# Patient Record
Sex: Female | Born: 1970 | Hispanic: Yes | Marital: Married | Smoking: Never smoker
Health system: Southern US, Community
[De-identification: ages and names within clinical notes are randomized; demographics above are authoritative.]

## PROBLEM LIST (undated history)

## (undated) DIAGNOSIS — R200 Anesthesia of skin: Secondary | ICD-10-CM

## (undated) DIAGNOSIS — R202 Paresthesia of skin: Secondary | ICD-10-CM

## (undated) DIAGNOSIS — R51 Headache: Secondary | ICD-10-CM

## (undated) DIAGNOSIS — K219 Gastro-esophageal reflux disease without esophagitis: Secondary | ICD-10-CM

## (undated) DIAGNOSIS — R198 Other specified symptoms and signs involving the digestive system and abdomen: Secondary | ICD-10-CM

## (undated) DIAGNOSIS — E669 Obesity, unspecified: Secondary | ICD-10-CM

## (undated) DIAGNOSIS — E119 Type 2 diabetes mellitus without complications: Secondary | ICD-10-CM

## (undated) DIAGNOSIS — Z8042 Family history of malignant neoplasm of prostate: Secondary | ICD-10-CM

## (undated) DIAGNOSIS — M542 Cervicalgia: Secondary | ICD-10-CM

## (undated) DIAGNOSIS — E8941 Symptomatic postprocedural ovarian failure: Secondary | ICD-10-CM

## (undated) DIAGNOSIS — R7611 Nonspecific reaction to tuberculin skin test without active tuberculosis: Secondary | ICD-10-CM

## (undated) DIAGNOSIS — R519 Headache, unspecified: Secondary | ICD-10-CM

## (undated) DIAGNOSIS — R319 Hematuria, unspecified: Secondary | ICD-10-CM

## (undated) DIAGNOSIS — R21 Rash and other nonspecific skin eruption: Secondary | ICD-10-CM

## (undated) DIAGNOSIS — I10 Essential (primary) hypertension: Secondary | ICD-10-CM

## (undated) DIAGNOSIS — C562 Malignant neoplasm of left ovary: Secondary | ICD-10-CM

## (undated) DIAGNOSIS — R109 Unspecified abdominal pain: Secondary | ICD-10-CM

## (undated) DIAGNOSIS — Z8719 Personal history of other diseases of the digestive system: Secondary | ICD-10-CM

## (undated) HISTORY — DX: Essential (primary) hypertension: I10

## (undated) HISTORY — PX: CHOLECYSTECTOMY: SHX55

## (undated) HISTORY — DX: Family history of malignant neoplasm of prostate: Z80.42

---
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
## 2012-06-14 ENCOUNTER — Other Ambulatory Visit (HOSPITAL_COMMUNITY): Payer: Self-pay | Admitting: *Deleted

## 2012-06-14 DIAGNOSIS — Z1231 Encounter for screening mammogram for malignant neoplasm of breast: Secondary | ICD-10-CM

## 2012-06-24 ENCOUNTER — Ambulatory Visit (HOSPITAL_COMMUNITY)
Admission: RE | Admit: 2012-06-24 | Discharge: 2012-06-24 | Disposition: A | Discharge status: Home / Self Care | Payer: Self-pay | Source: Ambulatory Visit | Attending: *Deleted | Admitting: *Deleted

## 2012-06-24 DIAGNOSIS — Z1231 Encounter for screening mammogram for malignant neoplasm of breast: Secondary | ICD-10-CM

## 2013-10-17 ENCOUNTER — Other Ambulatory Visit (HOSPITAL_COMMUNITY): Payer: Self-pay | Admitting: *Deleted

## 2013-10-17 DIAGNOSIS — Z1231 Encounter for screening mammogram for malignant neoplasm of breast: Secondary | ICD-10-CM

## 2013-10-29 ENCOUNTER — Ambulatory Visit (HOSPITAL_COMMUNITY)
Admission: RE | Admit: 2013-10-29 | Discharge: 2013-10-29 | Disposition: A | Discharge status: Home / Self Care | Payer: Self-pay | Source: Ambulatory Visit | Attending: *Deleted | Admitting: *Deleted

## 2013-10-29 DIAGNOSIS — Z1231 Encounter for screening mammogram for malignant neoplasm of breast: Secondary | ICD-10-CM

## 2017-03-22 ENCOUNTER — Other Ambulatory Visit (HOSPITAL_COMMUNITY): Payer: Self-pay | Admitting: *Deleted

## 2017-03-22 DIAGNOSIS — Z1231 Encounter for screening mammogram for malignant neoplasm of breast: Secondary | ICD-10-CM

## 2017-03-26 ENCOUNTER — Ambulatory Visit (HOSPITAL_COMMUNITY)
Admission: RE | Admit: 2017-03-26 | Discharge: 2017-03-26 | Disposition: A | Discharge status: Home / Self Care | Payer: PRIVATE HEALTH INSURANCE | Source: Ambulatory Visit | Attending: *Deleted | Admitting: *Deleted

## 2017-03-26 DIAGNOSIS — Z1231 Encounter for screening mammogram for malignant neoplasm of breast: Secondary | ICD-10-CM | POA: Diagnosis not present

## 2017-04-02 ENCOUNTER — Other Ambulatory Visit (HOSPITAL_COMMUNITY): Payer: Self-pay | Admitting: Nurse Practitioner

## 2017-04-02 DIAGNOSIS — R102 Pelvic and perineal pain: Secondary | ICD-10-CM

## 2017-04-05 ENCOUNTER — Ambulatory Visit (HOSPITAL_COMMUNITY): Payer: Self-pay

## 2017-07-28 HISTORY — PX: OTHER SURGICAL HISTORY: SHX169

## 2017-09-07 ENCOUNTER — Telehealth: Payer: Self-pay | Admitting: *Deleted

## 2017-09-07 NOTE — Telephone Encounter (Signed)
Called and spoke to the [facility] office, gave appt for 7/23 at 10:45am. Ask them to fax over records

## 2017-09-14 ENCOUNTER — Encounter: Payer: Self-pay | Admitting: *Deleted

## 2017-09-18 ENCOUNTER — Inpatient Hospital Stay: Payer: Self-pay | Attending: Gynecologic Oncology | Admitting: Gynecologic Oncology

## 2017-09-18 ENCOUNTER — Telehealth: Payer: Self-pay | Admitting: Gynecologic Oncology

## 2017-09-18 ENCOUNTER — Encounter: Payer: Self-pay | Admitting: Gynecologic Oncology

## 2017-09-18 ENCOUNTER — Telehealth: Payer: Self-pay | Admitting: Oncology

## 2017-09-18 ENCOUNTER — Inpatient Hospital Stay: Payer: Self-pay

## 2017-09-18 VITALS — BP 125/56 | HR 61 | Temp 98.5°F | Resp 20 | Ht 63.0 in | Wt 194.3 lb

## 2017-09-18 DIAGNOSIS — C562 Malignant neoplasm of left ovary: Secondary | ICD-10-CM | POA: Insufficient documentation

## 2017-09-18 DIAGNOSIS — Z90722 Acquired absence of ovaries, bilateral: Secondary | ICD-10-CM | POA: Insufficient documentation

## 2017-09-18 DIAGNOSIS — Z6841 Body Mass Index (BMI) 40.0 and over, adult: Secondary | ICD-10-CM

## 2017-09-18 LAB — BASIC METABOLIC PANEL - CANCER CENTER ONLY
ANION GAP: 9 (ref 5–15)
BUN: 10 mg/dL (ref 6–20)
CHLORIDE: 107 mmol/L (ref 98–111)
CO2: 23 mmol/L (ref 22–32)
Calcium: 9.7 mg/dL (ref 8.9–10.3)
Creatinine: 0.67 mg/dL (ref 0.44–1.00)
GFR, Estimated: >60 mL/min (ref >60)
Glucose, Bld: 81 mg/dL (ref 70–99)
POTASSIUM: 4 mmol/L (ref 3.5–5.1)
SODIUM: 139 mmol/L (ref 135–145)

## 2017-09-18 NOTE — Progress Notes (Signed)
Consult Note: Gyn-Onc  Consult was requested by Dr. Barrie Dunker for the evaluation of Kristy Franklin 47 y.o. female  CC:  Chief Complaint  Patient presents with  . Serous adenocarcinoma [facility])    Assessment/Plan:  Kristy Franklin  is a 47 y.o.  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  I discussed the nature of ovarian cancer with the patient.  I discussed that due to the high-grade nature of her lesion and the rupture of the capsule intraoperatively and its adherence to surrounding structures we are recommending adjuvant chemotherapy with 6 cycles of carboplatin and paclitaxel.  I believe it is beneficial to perform surgical staging and completion surgery prior to commencing chemotherapy.  This will involve a robotically assisted total hysterectomy right salpingo-oophorectomy, omental biopsy, and lymphadenectomy.  Due to her morbid obesity which is predominantly central and abdominal and extensive lymphadenectomy may be limited.  Given that the results of staging will not change postoperative treatment, we will perform what is surgically feasible and safe minimizing the likelihood of morbidity so that she can move on to chemotherapy postoperatively.  Prior to performing surgery will obtain a CT scan of the chest abdomen and pelvis to look for gross residual disease after primary surgery that will need to be addressed at the time of the surgery, and to rule out the necessity of a debulking procedure.  I discussed surgical risks with the patient including  bleeding, infection, damage to internal organs (such as bladder,ureters, bowels), blood clot, reoperation and rehospitalization.  I explained that these risks are higher for obese patients.  She will require prolonged postoperative anticoagulation due to her cancer diagnosis.  Surgery is scheduled for the first week of August 2019.  She will require referral to genetics given her epithelial ovarian cancer and  a referral to Dr Alvy Bimler for chemotherapy.    HPI: Kristy Franklin is a 47 year old P2 who is seen in consultation at the request of Dr Barrie Dunker for high grade serous ovarian cancer, clinical stage IC.  The patient reports a history of having seen the emergency room in [place] in May or June 2019 when she developed left lower quadrant pain.  During the ER visit an ultrasound performed which identified an adnexal mass on the left, with no signs of torsion.  She was then seen in the offices of Dr. Barrie Dunker in June 2019 and a plan was made to proceed with surgical removal of the adnexa.  On August 23, 2017 she was taken to the operating room for a laparoscopic left salpingo-oophorectomy.  Review of the operative note suggest that intraoperative findings were significant for a 6 cm left adnexal mass that was stuck in the cul-de-sac with some paraovarian adhesions noted.  It did not appear to be malignant in appearance to the surgeons at the time of surgery.  The upper abdomen, diaphragms, and omentum were commented on as looking normal.  There was no ascites.  The left tube and ovary was removed, however there was some fragmentation of the specimen during removal, and therefore the left tube and ovary were sent as 2 specimens.  The first specimen is labeled ovary biopsy for frozen section which revealed serous carcinoma and the second specimen is labeled left ovary on permanent this revealed serous carcinoma.  The operative note reports that the intraoperative diagnosis was borderline tumor, or reviewing the pathology report it states that the intraoperative diagnosis was left ovarian biopsy epithelial neoplasm at least borderline,  cannot exclude carcinoma.  The frozen section of the second specimen was the same (epithelial neoplasm at least borderline cannot exclude carcinoma.).  The patient was seen back in the office by Dr. Barrie Dunker on September 06, 2017 and delivered her diagnosis of carcinoma.  At that time she  was referred for consultation with gynecologic oncology.  Her past medical history 6 significant for obesity and hypertension.  She has had 2 prior cesarean deliveries.  She does not desire future fertility as she is completed childbearing.  She has no history of abnormal Pap smears and her last Pap was 7 to 8 months ago was normal.  Her past surgical history is significant for 2 cesarean sections and a laparoscopic cholecystectomy.  Her only family history is significant for father with prostate cancer at age 2.  Current Meds:  Outpatient Encounter Medications as of 09/18/2017  Medication Sig  . HYDROcodone-acetaminophen (NORCO/VICODIN) 5-325 MG tablet Take 1 tablet by mouth every 6 (six) hours as needed. for pain  . lisinopril (PRINIVIL,ZESTRIL) 10 MG tablet Take 1 tablet by mouth daily.   No facility-administered encounter medications on file as of 09/18/2017.     Allergy:  Allergies  Allergen Reactions  . Other Rash    Pork    Social Hx:   Social History   Socioeconomic History  . Marital status: Married    Spouse name: Not on file  . Number of children: Not on file  . Years of education: Not on file  . Highest education level: Not on file  Occupational History  . Not on file  Social Needs  . Financial resource strain: Not on file  . Food insecurity:    Worry: Not on file    Inability: Not on file  . Transportation needs:    Medical: Not on file    Non-medical: Not on file  Tobacco Use  . Smoking status: Never Smoker  . Smokeless tobacco: Never Used  Substance and Sexual Activity  . Alcohol use: Never    Frequency: Never  . Drug use: Never  . Sexual activity: Not on file  Lifestyle  . Physical activity:    Days per week: Not on file    Minutes per session: Not on file  . Stress: Not on file  Relationships  . Social connections:    Talks on phone: Not on file    Gets together: Not on file    Attends religious service: Not on file    Active member of club or  organization: Not on file    Attends meetings of clubs or organizations: Not on file    Relationship status: Not on file  . Intimate partner violence:    Fear of current or ex partner: Not on file    Emotionally abused: Not on file    Physically abused: Not on file    Forced sexual activity: Not on file  Other Topics Concern  . Not on file  Social History Narrative  . Not on file    Past Surgical Hx:  Past Surgical History:  Procedure Laterality Date  . CESAREAN SECTION    . CHOLECYSTECTOMY     20 years ago  . OTHER SURGICAL HISTORY  07/2017   Left ovary removed    Past Medical Hx:  Past Medical History:  Diagnosis Date  . Hypertension     Past Gynecological History:  C/s x 2. No history of abnormal paps.  No LMP recorded.  Family Hx:  Family History  Problem Relation Age of Onset  . Hypertension Mother   . Prostate cancer Father     Review of Systems:  Constitutional  Feels well,    ENT Normal appearing ears and nares bilaterally Skin/Breast  No rash, sores, jaundice, itching, dryness Cardiovascular  No chest pain, shortness of breath, or edema  Pulmonary  No cough or wheeze.  Gastro Intestinal  No nausea, vomitting, or diarrhoea. No bright red blood per rectum, no abdominal pain, change in bowel movement, or constipation.  Genito Urinary  No frequency, urgency, dysuria, no pain, no bleeding. Musculo Skeletal  No myalgia, arthralgia, joint swelling or pain  Neurologic  No weakness, numbness, change in gait,  Psychology  No depression, anxiety, insomnia.   Vitals:  Blood pressure (!) 125/56, pulse 61, temperature 98.5 F (36.9 C), temperature source Oral, resp. rate 20, height 5\' 3"  (1.6 m), weight 194 lb 4.8 oz (88.1 kg), SpO2 100 %.  Physical Exam: WD in NAD Neck  Supple NROM, without any enlargements.  Lymph Node Survey No cervical supraclavicular or inguinal adenopathy Cardiovascular  Pulse normal rate, regularity and rhythm. S1 and S2  normal.  Lungs  Clear to auscultation bilateraly, without wheezes/crackles/rhonchi. Good air movement.  Skin  No rash/lesions/breakdown  Psychiatry  Alert and oriented to person, place, and time  Abdomen  Normoactive bowel sounds, abdomen soft, non-tender and obese without evidence of hernia.  Back No CVA tenderness Genito Urinary  Vulva/vagina: Normal external female genitalia.   No lesions. No discharge or bleeding.  Bladder/urethra:  No lesions or masses, well supported bladder  Vagina: normal  Cervix: Normal appearing, no lesions.  Uterus:  Slightly bulky, mobile, no parametrial involvement or nodularity.  Adnexa: no palpable masses. Rectal  deferred Extremities  No bilateral cyanosis, clubbing or edema.   Thereasa Solo, MD  09/18/2017, 5:02 PM

## 2017-09-18 NOTE — Telephone Encounter (Signed)
Called patient regarding 8/14

## 2017-09-18 NOTE — H&P (View-Only) (Signed)
Consult Note: Gyn-Onc  Consult was requested by Dr. Barrie Dunker for the evaluation of Kristy Franklin 47 y.o. female  CC:  Chief Complaint  Patient presents with  . Serous adenocarcinoma [facility])    Assessment/Plan:  Kristy Franklin  is a 47 y.o.  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  I discussed the nature of ovarian cancer with the patient.  I discussed that due to the high-grade nature of her lesion and the rupture of the capsule intraoperatively and its adherence to surrounding structures we are recommending adjuvant chemotherapy with 6 cycles of carboplatin and paclitaxel.  I believe it is beneficial to perform surgical staging and completion surgery prior to commencing chemotherapy.  This will involve a robotically assisted total hysterectomy right salpingo-oophorectomy, omental biopsy, and lymphadenectomy.  Due to her morbid obesity which is predominantly central and abdominal and extensive lymphadenectomy may be limited.  Given that the results of staging will not change postoperative treatment, we will perform what is surgically feasible and safe minimizing the likelihood of morbidity so that she can move on to chemotherapy postoperatively.  Prior to performing surgery will obtain a CT scan of the chest abdomen and pelvis to look for gross residual disease after primary surgery that will need to be addressed at the time of the surgery, and to rule out the necessity of a debulking procedure.  I discussed surgical risks with the patient including  bleeding, infection, damage to internal organs (such as bladder,ureters, bowels), blood clot, reoperation and rehospitalization.  I explained that these risks are higher for obese patients.  She will require prolonged postoperative anticoagulation due to her cancer diagnosis.  Surgery is scheduled for the first week of August 2019.  She will require referral to genetics given her epithelial ovarian cancer and  a referral to Dr Alvy Bimler for chemotherapy.    HPI: [name] is a 47 year old P2 who is seen in consultation at the request of Dr Barrie Dunker for high grade serous ovarian cancer, clinical stage IC.  The patient reports a history of having seen the emergency room in [place] in May or June 2019 when she developed left lower quadrant pain.  During the ER visit an ultrasound performed which identified an adnexal mass on the left, with no signs of torsion.  She was then seen in the offices of Dr. Barrie Dunker in June 2019 and a plan was made to proceed with surgical removal of the adnexa.  On August 23, 2017 she was taken to the operating room for a laparoscopic left salpingo-oophorectomy.  Review of the operative note suggest that intraoperative findings were significant for a 6 cm left adnexal mass that was stuck in the cul-de-sac with some paraovarian adhesions noted.  It did not appear to be malignant in appearance to the surgeons at the time of surgery.  The upper abdomen, diaphragms, and omentum were commented on as looking normal.  There was no ascites.  The left tube and ovary was removed, however there was some fragmentation of the specimen during removal, and therefore the left tube and ovary were sent as 2 specimens.  The first specimen is labeled ovary biopsy for frozen section which revealed serous carcinoma and the second specimen is labeled left ovary on permanent this revealed serous carcinoma.  The operative note reports that the intraoperative diagnosis was borderline tumor, or reviewing the pathology report it states that the intraoperative diagnosis was left ovarian biopsy epithelial neoplasm at least borderline,  cannot exclude carcinoma.  The frozen section of the second specimen was the same (epithelial neoplasm at least borderline cannot exclude carcinoma.).  The patient was seen back in the office by Dr. Barrie Dunker on September 06, 2017 and delivered her diagnosis of carcinoma.  At that time she  was referred for consultation with gynecologic oncology.  Her past medical history 6 significant for obesity and hypertension.  She has had 2 prior cesarean deliveries.  She does not desire future fertility as she is completed childbearing.  She has no history of abnormal Pap smears and her last Pap was 7 to 8 months ago was normal.  Her past surgical history is significant for 2 cesarean sections and a laparoscopic cholecystectomy.  Her only family history is significant for father with prostate cancer at age 60.  Current Meds:  Outpatient Encounter Medications as of 09/18/2017  Medication Sig  . HYDROcodone-acetaminophen (NORCO/VICODIN) 5-325 MG tablet Take 1 tablet by mouth every 6 (six) hours as needed. for pain  . lisinopril (PRINIVIL,ZESTRIL) 10 MG tablet Take 1 tablet by mouth daily.   No facility-administered encounter medications on file as of 09/18/2017.     Allergy:  Allergies  Allergen Reactions  . Other Rash    Pork    Social Hx:   Social History   Socioeconomic History  . Marital status: Married    Spouse name: Not on file  . Number of children: Not on file  . Years of education: Not on file  . Highest education level: Not on file  Occupational History  . Not on file  Social Needs  . Financial resource strain: Not on file  . Food insecurity:    Worry: Not on file    Inability: Not on file  . Transportation needs:    Medical: Not on file    Non-medical: Not on file  Tobacco Use  . Smoking status: Never Smoker  . Smokeless tobacco: Never Used  Substance and Sexual Activity  . Alcohol use: Never    Frequency: Never  . Drug use: Never  . Sexual activity: Not on file  Lifestyle  . Physical activity:    Days per week: Not on file    Minutes per session: Not on file  . Stress: Not on file  Relationships  . Social connections:    Talks on phone: Not on file    Gets together: Not on file    Attends religious service: Not on file    Active member of club or  organization: Not on file    Attends meetings of clubs or organizations: Not on file    Relationship status: Not on file  . Intimate partner violence:    Fear of current or ex partner: Not on file    Emotionally abused: Not on file    Physically abused: Not on file    Forced sexual activity: Not on file  Other Topics Concern  . Not on file  Social History Narrative  . Not on file    Past Surgical Hx:  Past Surgical History:  Procedure Laterality Date  . CESAREAN SECTION    . CHOLECYSTECTOMY     20 years ago  . OTHER SURGICAL HISTORY  07/2017   Left ovary removed    Past Medical Hx:  Past Medical History:  Diagnosis Date  . Hypertension     Past Gynecological History:  C/s x 2. No history of abnormal paps.  No LMP recorded.  Family Hx:  Family History  Problem Relation Age of Onset  . Hypertension Mother   . Prostate cancer Father     Review of Systems:  Constitutional  Feels well,    ENT Normal appearing ears and nares bilaterally Skin/Breast  No rash, sores, jaundice, itching, dryness Cardiovascular  No chest pain, shortness of breath, or edema  Pulmonary  No cough or wheeze.  Gastro Intestinal  No nausea, vomitting, or diarrhoea. No bright red blood per rectum, no abdominal pain, change in bowel movement, or constipation.  Genito Urinary  No frequency, urgency, dysuria, no pain, no bleeding. Musculo Skeletal  No myalgia, arthralgia, joint swelling or pain  Neurologic  No weakness, numbness, change in gait,  Psychology  No depression, anxiety, insomnia.   Vitals:  Blood pressure (!) 125/56, pulse 61, temperature 98.5 F (36.9 C), temperature source Oral, resp. rate 20, height 5\' 3"  (1.6 m), weight 194 lb 4.8 oz (88.1 kg), SpO2 100 %.  Physical Exam: WD in NAD Neck  Supple NROM, without any enlargements.  Lymph Node Survey No cervical supraclavicular or inguinal adenopathy Cardiovascular  Pulse normal rate, regularity and rhythm. S1 and S2  normal.  Lungs  Clear to auscultation bilateraly, without wheezes/crackles/rhonchi. Good air movement.  Skin  No rash/lesions/breakdown  Psychiatry  Alert and oriented to person, place, and time  Abdomen  Normoactive bowel sounds, abdomen soft, non-tender and obese without evidence of hernia.  Back No CVA tenderness Genito Urinary  Vulva/vagina: Normal external female genitalia.   No lesions. No discharge or bleeding.  Bladder/urethra:  No lesions or masses, well supported bladder  Vagina: normal  Cervix: Normal appearing, no lesions.  Uterus:  Slightly bulky, mobile, no parametrial involvement or nodularity.  Adnexa: no palpable masses. Rectal  deferred Extremities  No bilateral cyanosis, clubbing or edema.   Thereasa Solo, MD  09/18/2017, 5:02 PM

## 2017-09-18 NOTE — Telephone Encounter (Signed)
Left a message with Stefanie Libel, Financial advocate to talk with patient about assistance.

## 2017-09-18 NOTE — Patient Instructions (Signed)
Preparing for your Surgery  Plan for surgery on October 02, 2017 with Dr. Everitt Amber at [place] will be scheduled for a robotic assisted total hysterectomy, right salpingo-oophorectomy, omental biopsy, lymphadenectomy.  Pre-operative Testing -You will receive a phone call from presurgical testing at [facility] to arrange for a pre-operative testing appointment before your surgery.  This appointment normally occurs one to two weeks before your scheduled surgery.   -Bring your insurance card, copy of an advanced directive if applicable, medication list  -At that visit, you will be asked to sign a consent for a possible blood transfusion in case a transfusion becomes necessary during surgery.  The need for a blood transfusion is rare but having consent is a necessary part of your care.     -You should not be taking blood thinners or aspirin at least ten days prior to surgery unless instructed by your surgeon.  Day Before Surgery at [place] will be asked to take in a light diet the day before surgery.  Avoid carbonated beverages.  You will be advised to have nothing to eat or drink after midnight the evening before.    Eat a light diet the day before surgery.  Examples including soups, broths, toast, yogurt, mashed potatoes.  Things to avoid include carbonated beverages (fizzy beverages), raw fruits and raw vegetables, or beans.   If your bowels are filled with gas, your surgeon will have difficulty visualizing your pelvic organs which increases your surgical risks.  Your role in recovery Your role is to become active as soon as directed by your doctor, while still giving yourself time to heal.  Rest when you feel tired. You will be asked to do the following in order to speed your recovery:  - Cough and breathe deeply. This helps toclear and expand your lungs and can prevent pneumonia. You may be given a spirometer to practice deep breathing. A staff  member will show you how to use the spirometer. - Do mild physical activity. Walking or moving your legs help your circulation and body functions return to normal. A staff member will help you when you try to walk and will provide you with simple exercises. Do not try to get up or walk alone the first time. - Actively manage your pain. Managing your pain lets you move in comfort. We will ask you to rate your pain on a scale of zero to 10. It is your responsibility to tell your doctor or nurse where and how much you hurt so your pain can be treated.  Special Considerations -If you are diabetic, you may be placed on insulin after surgery to have closer control over your blood sugars to promote healing and recovery.  This does not mean that you will be discharged on insulin.  If applicable, your oral antidiabetics will be resumed when you are tolerating a solid diet.  -Your final pathology results from surgery should be available by the Friday after surgery and the results will be relayed to you when available.  -Dr. Lahoma Crocker is the Surgeon that assists your GYN Oncologist with surgery.  The next day after your surgery you will either see your GYN Oncologist or Dr. Lahoma Crocker.   Blood Transfusion Information WHAT IS A BLOOD TRANSFUSION? A transfusion is the replacement of blood or some of its parts. Blood is made up of multiple cells which provide different functions.  Red blood cells carry oxygen and are used for blood loss  replacement.  White blood cells fight against infection.  Platelets control bleeding.  Plasma helps clot blood.  Other blood products are available for specialized needs, such as hemophilia or other clotting disorders. BEFORE THE TRANSFUSION  Who gives blood for transfusions?   You may be able to donate blood to be used at a later date on yourself (autologous donation).  Relatives can be asked to donate blood. This is generally not any safer than if  you have received blood from a stranger. The same precautions are taken to ensure safety when a relative's blood is donated.  Healthy volunteers who are fully evaluated to make sure their blood is safe. This is blood bank blood. Transfusion therapy is the safest it has ever been in the practice of medicine. Before blood is taken from a donor, a complete history is taken to make sure that person has no history of diseases nor engages in risky social behavior (examples are intravenous drug use or sexual activity with multiple partners). The donor's travel history is screened to minimize risk of transmitting infections, such as malaria. The donated blood is tested for signs of infectious diseases, such as HIV and hepatitis. The blood is then tested to be sure it is compatible with you in order to minimize the chance of a transfusion reaction. If you or a relative donates blood, this is often done in anticipation of surgery and is not appropriate for emergency situations. It takes many days to process the donated blood. RISKS AND COMPLICATIONS Although transfusion therapy is very safe and saves many lives, the main dangers of transfusion include:   Getting an infectious disease.  Developing a transfusion reaction. This is an allergic reaction to something in the blood you were given. Every precaution is taken to prevent this. The decision to have a blood transfusion has been considered carefully by your caregiver before blood is given. Blood is not given unless the benefits outweigh the risks.

## 2017-09-19 NOTE — Telephone Encounter (Signed)
Talked to [place] and [address], Estate manager/land agent will be calling the patient to discuss options for financial assistance.

## 2017-09-24 ENCOUNTER — Ambulatory Visit (HOSPITAL_COMMUNITY)
Admission: RE | Admit: 2017-09-24 | Discharge: 2017-09-24 | Disposition: A | Discharge status: Home / Self Care | Payer: Self-pay | Source: Ambulatory Visit | Attending: Gynecologic Oncology | Admitting: Gynecologic Oncology

## 2017-09-24 ENCOUNTER — Encounter (HOSPITAL_COMMUNITY): Payer: Self-pay | Admitting: Radiology

## 2017-09-24 ENCOUNTER — Encounter (HOSPITAL_COMMUNITY): Payer: Self-pay

## 2017-09-24 DIAGNOSIS — R911 Solitary pulmonary nodule: Secondary | ICD-10-CM | POA: Insufficient documentation

## 2017-09-24 DIAGNOSIS — C562 Malignant neoplasm of left ovary: Secondary | ICD-10-CM | POA: Insufficient documentation

## 2017-09-24 MED ORDER — IOPAMIDOL (ISOVUE-300) INJECTION 61%
INTRAVENOUS | Status: AC
  Filled 2017-09-24: qty 100

## 2017-09-24 MED ORDER — IOPAMIDOL (ISOVUE-300) INJECTION 61%
100.0000 mL | Freq: Once | INTRAVENOUS | Status: AC | PRN
  Administered 2017-09-24: 100 mL via INTRAVENOUS

## 2017-09-24 NOTE — Patient Instructions (Addendum)
Instrucciones:  Su cirugia esta programada para-( your procedure is scheduled on) : Tuesday, Aug. 6, 2019  Entre por la entrada principal a la(s) -(enter through the main entrance at): 10:45 AM  Por favor llame al [phone] si tiene algun problema la [place] [facility] [place] ( please call [phone] if you have any problems the morning of surgery.)  Recuerde: (Remember)  No coma alimentos ni tome liquidos, incluyendo agua, despues de la medianoche del  ( Do not eat food or drink liquids including water after midnight on Monday  Tome estas medicinas la manana de la cirugia con un sorbito de agua (take these meds the morning of surgery with a SIP of water) NONE  Puede cepillarse los dientes en la manana de la [place]. (you may brush your teeth the morning of surgery)  NO use joyas, maquillaje de ojos, lapiz labial, crema para el cuerpo o esmalte de unas oscuro - las unas de los pies pueden estar pintados. ( Do not wear jewelry, eye makeup, lipstick, body lotion, or dark fingernail polish)  Puede usar desodorante ( you may wear deodorant)  Si va a ser ingresado despues de las [place], deje la [place] en el carro hasta que se le haya asignado una habitacion. ( If you are to be admitted after surgery, leave suitcase in car until your room has been assigned.)  Kristy Franklin (patient signature) ______________________________________    Your procedure is scheduled on: Tuesday, Aug. 6, 2019   Surgery Time:  1:15PM-4:15PM   Report to [facility]  Entrance    Report to admitting at 10:45 AM   Call this number if you have problems the morning of surgery [phone]   Do not eat food or drink liquids :After Midnight.   Do NOT smoke after Midnight   Take these medicines the morning of surgery with A SIP OF WATER: None                               You may not have any metal on your body including hair pins, jewelry, and body piercings             Do not wear make-up,  lotions, powders, perfumes/cologne, or deodorant             Do not wear nail polish.  Do not shave  48 hours prior to surgery.                Do not bring valuables to the hospital. [place].   Contacts, dentures or bridgework may not be worn into surgery.   Leave suitcase in the car. After surgery it may be brought to your room.   Special Instructions: Bring a copy of your healthcare power of attorney and living will documents         the day of surgery if you haven't scanned them in before.              Please read over the following fact sheets you were given:  [facility] - Preparing for Surgery Before surgery, you can play an important role.  Because skin is not sterile, your skin needs to be as free of germs as possible.  You can reduce the number of germs on your skin by washing with CHG (chlorahexidine gluconate) soap before surgery.  CHG is an antiseptic cleaner which kills germs and bonds with the skin to continue killing germs even after washing. Please DO NOT use if you have an allergy to CHG or antibacterial soaps.  If your skin becomes reddened/irritated stop using the CHG and inform your nurse when you arrive at Short Stay. Do not shave (including legs and underarms) for at least 48 hours prior to the first CHG shower.  You may shave your face/neck.  Please follow these instructions carefully:  1.  Shower with CHG Soap the night before surgery and the  morning of surgery.  2.  If you choose to wash your hair, wash your hair first as usual with your normal  shampoo.  3.  After you shampoo, rinse your hair and body thoroughly to remove the shampoo.                             4.  Use CHG as you would any other liquid soap.  You can apply chg directly to the skin and wash.  Gently with a scrungie or clean washcloth.  5.  Apply the CHG Soap to your body ONLY FROM THE NECK DOWN.   Do   not use on face/ open                            Wound or open sores. Avoid contact with eyes, ears mouth and   genitals (private parts).                       Wash face,  Genitals (private parts) with your normal soap.             6.  Wash thoroughly, paying special attention to the area where your    surgery  will be performed.  7.  Thoroughly rinse your body with warm water from the neck down.  8.  DO NOT shower/wash with your normal soap after using and rinsing off the CHG Soap.                9.  Pat yourself dry with a clean towel.            10.  Wear clean pajamas.            11.  Place clean sheets on your bed the night of your first shower and do not  sleep with pets. Day of Surgery : Do not apply any lotions/deodorants the morning of surgery.  Please wear clean clothes to the hospital/surgery center.  FAILURE TO FOLLOW THESE INSTRUCTIONS MAY RESULT IN THE CANCELLATION OF YOUR SURGERY  PATIENT SIGNATURE_________________________________  NURSE SIGNATURE__________________________________  ________________________________________________________________________   Adam Phenix  An incentive spirometer is a tool that can help keep your lungs clear and active. This tool measures how well you are filling your lungs with each breath. Taking long deep breaths may help reverse or decrease the chance of developing breathing (pulmonary) problems (especially infection) following:  A long period of time when you are unable to move or be active. BEFORE THE PROCEDURE   If the spirometer includes an indicator to show your best effort, your nurse or respiratory therapist will set it to a desired goal.  If possible, sit up straight or lean slightly forward. Try not to slouch.  Hold the incentive spirometer in an upright position. INSTRUCTIONS FOR USE  1. Sit  on the edge of your bed if possible, or sit up as far as you can in bed or on a chair. 2. Hold the incentive spirometer in an upright position. 3. Breathe out  normally. 4. Place the mouthpiece in your mouth and seal your lips tightly around it. 5. Breathe in slowly and as deeply as possible, raising the piston or the ball toward the top of the column. 6. Hold your breath for 3-5 seconds or for as long as possible. Allow the piston or ball to fall to the bottom of the column. 7. Remove the mouthpiece from your mouth and breathe out normally. 8. Rest for a few seconds and repeat Steps 1 through 7 at least 10 times every 1-2 hours when you are awake. Take your time and take a few normal breaths between deep breaths. 9. The spirometer may include an indicator to show your best effort. Use the indicator as a goal to work toward during each repetition. 10. After each set of 10 deep breaths, practice coughing to be sure your lungs are clear. If you have an incision (the cut made at the time of surgery), support your incision when coughing by placing a pillow or rolled up towels firmly against it. Once you are able to get out of bed, walk around indoors and cough well. You may stop using the incentive spirometer when instructed by your caregiver.  RISKS AND COMPLICATIONS  Take your time so you do not get dizzy or light-headed.  If you are in pain, you may need to take or ask for pain medication before doing incentive spirometry. It is harder to take a deep breath if you are having pain. AFTER USE  Rest and breathe slowly and easily.  It can be helpful to keep track of a log of your progress. Your caregiver can provide you with a simple table to help with this. If you are using the spirometer at home, follow these instructions: [place] IF:   You are having difficultly using the spirometer.  You have trouble using the spirometer as often as instructed.  Your pain medication is not giving enough relief while using the spirometer.  You develop fever of 100.5 F (38.1 C) or higher. SEEK IMMEDIATE MEDICAL CARE IF:   You cough up bloody sputum  that had not been present before.  You develop fever of 102 F (38.9 C) or greater.  You develop worsening pain at or near the incision site. MAKE SURE YOU:   Understand these instructions.  Will watch your condition.  Will get help right away if you are not doing well or get worse. Document Released: 06/26/2006 Document Revised: 05/08/2011 Document Reviewed: 08/27/2006 Exit[employer] Patient Information 2014 Exit[employer], [place].   ________________________________________________________________________  WHAT IS A BLOOD TRANSFUSION? Blood Transfusion Information  A transfusion is the replacement of blood or some of its parts. Blood is made up of multiple cells which provide different functions.  Red blood cells carry oxygen and are used for blood loss replacement.  White blood cells fight against infection.  Platelets control bleeding.  Plasma helps clot blood.  Other blood products are available for specialized needs, such as hemophilia or other clotting disorders. BEFORE THE TRANSFUSION  Who gives blood for transfusions?   Healthy volunteers who are fully evaluated to make sure their blood is safe. This is blood bank blood. Transfusion therapy is the safest it has ever been in the practice of medicine. Before blood is taken from a donor, a  complete history is taken to make sure that person has no history of diseases nor engages in risky social behavior (examples are intravenous drug use or sexual activity with multiple partners). The donor's travel history is screened to minimize risk of transmitting infections, such as malaria. The donated blood is tested for signs of infectious diseases, such as HIV and hepatitis. The blood is then tested to be sure it is compatible with you in order to minimize the chance of a transfusion reaction. If you or a relative donates blood, this is often done in anticipation of surgery and is not appropriate for emergency situations. It takes many days to  process the donated blood. RISKS AND COMPLICATIONS Although transfusion therapy is very safe and saves many lives, the main dangers of transfusion include:   Getting an infectious disease.  Developing a transfusion reaction. This is an allergic reaction to something in the blood you were given. Every precaution is taken to prevent this. The decision to have a blood transfusion has been considered carefully by your caregiver before blood is given. Blood is not given unless the benefits outweigh the risks. AFTER THE TRANSFUSION  Right after receiving a blood transfusion, you will usually feel much better and more energetic. This is especially true if your red blood cells have gotten low (anemic). The transfusion raises the level of the red blood cells which carry oxygen, and this usually causes an energy increase.  The nurse administering the transfusion will monitor you carefully for complications. HOME CARE INSTRUCTIONS  No special instructions are needed after a transfusion. You may find your energy is better. Speak with your caregiver about any limitations on activity for underlying diseases you may have. SEEK MEDICAL CARE IF:   Your condition is not improving after your transfusion.  You develop redness or irritation at the intravenous (IV) site. SEEK IMMEDIATE MEDICAL CARE IF:  Any of the following symptoms occur over the next 12 hours:  Shaking chills.  You have a temperature by mouth above 102 F (38.9 C), not controlled by medicine.  Chest, back, or muscle pain.  People around you feel you are not acting correctly or are confused.  Shortness of breath or difficulty breathing.  Dizziness and fainting.  You get a rash or develop hives.  You have a decrease in urine output.  Your urine turns a dark color or changes to pink, red, or brown. Any of the following symptoms occur over the next 10 days:  You have a temperature by mouth above 102 F (38.9 C), not controlled by  medicine.  Shortness of breath.  Weakness after normal activity.  The white part of the eye turns yellow (jaundice).  You have a decrease in the amount of urine or are urinating less often.  Your urine turns a dark color or changes to pink, red, or brown. Document Released: 02/11/2000 Document Revised: 05/08/2011 Document Reviewed: 09/30/2007 [employer] [employer] [facility] Patient Information 2014 [place], [place].  _______________________________________________________________________

## 2017-09-25 ENCOUNTER — Encounter (HOSPITAL_COMMUNITY)
Admission: RE | Admit: 2017-09-25 | Discharge: 2017-09-25 | Disposition: A | Discharge status: Home / Self Care | Payer: Self-pay | Source: Ambulatory Visit | Attending: Gynecologic Oncology | Admitting: Gynecologic Oncology

## 2017-09-25 ENCOUNTER — Encounter (HOSPITAL_COMMUNITY): Payer: Self-pay

## 2017-09-25 ENCOUNTER — Telehealth: Payer: Self-pay

## 2017-09-25 ENCOUNTER — Other Ambulatory Visit: Payer: Self-pay

## 2017-09-25 DIAGNOSIS — Z01810 Encounter for preprocedural cardiovascular examination: Secondary | ICD-10-CM | POA: Insufficient documentation

## 2017-09-25 DIAGNOSIS — Z01812 Encounter for preprocedural laboratory examination: Secondary | ICD-10-CM | POA: Insufficient documentation

## 2017-09-25 HISTORY — DX: Gastro-esophageal reflux disease without esophagitis: K21.9

## 2017-09-25 HISTORY — DX: Anesthesia of skin: R20.0

## 2017-09-25 HISTORY — DX: Other specified symptoms and signs involving the digestive system and abdomen: R19.8

## 2017-09-25 HISTORY — DX: Rash and other nonspecific skin eruption: R21

## 2017-09-25 HISTORY — DX: Cervicalgia: M54.2

## 2017-09-25 HISTORY — DX: Personal history of other diseases of the digestive system: Z87.19

## 2017-09-25 HISTORY — DX: Obesity, unspecified: E66.9

## 2017-09-25 HISTORY — DX: Headache: R51

## 2017-09-25 HISTORY — DX: Anesthesia of skin: R20.2

## 2017-09-25 HISTORY — DX: Unspecified abdominal pain: R10.9

## 2017-09-25 HISTORY — DX: Nonspecific reaction to tuberculin skin test without active tuberculosis: R76.11

## 2017-09-25 HISTORY — DX: Malignant neoplasm of left ovary: C56.2

## 2017-09-25 HISTORY — DX: Hematuria, unspecified: R31.9

## 2017-09-25 HISTORY — DX: Headache, unspecified: R51.9

## 2017-09-25 LAB — URINALYSIS, ROUTINE W REFLEX MICROSCOPIC
Bacteria, UA: NONE SEEN
RBC / HPF: >50 RBC/hpf — ABNORMAL HIGH (ref 0–5)

## 2017-09-25 LAB — COMPREHENSIVE METABOLIC PANEL
ALT: 47 U/L — ABNORMAL HIGH (ref 0–44)
ANION GAP: 8 (ref 5–15)
AST: 41 U/L (ref 15–41)
Albumin: 4.5 g/dL (ref 3.5–5.0)
Alkaline Phosphatase: 73 U/L (ref 38–126)
BILIRUBIN TOTAL: 0.7 mg/dL (ref 0.3–1.2)
BUN: 11 mg/dL (ref 6–20)
CO2: 26 mmol/L (ref 22–32)
Calcium: 9.8 mg/dL (ref 8.9–10.3)
Chloride: 107 mmol/L (ref 98–111)
Creatinine, Ser: 0.61 mg/dL (ref 0.44–1.00)
GFR calc Af Amer: >60 mL/min (ref >60)
Glucose, Bld: 82 mg/dL (ref 70–99)
POTASSIUM: 4.1 mmol/L (ref 3.5–5.1)
Sodium: 141 mmol/L (ref 135–145)
TOTAL PROTEIN: 8.1 g/dL (ref 6.5–8.1)

## 2017-09-25 LAB — CBC
HEMATOCRIT: 39.1 % (ref 36.0–46.0)
Hemoglobin: 12.9 g/dL (ref 12.0–15.0)
MCH: 27 pg (ref 26.0–34.0)
MCHC: 33 g/dL (ref 30.0–36.0)
MCV: 81.8 fL (ref 78.0–100.0)
Platelets: 404 10*3/uL — ABNORMAL HIGH (ref 150–400)
RBC: 4.78 MIL/uL (ref 3.87–5.11)
RDW: 13.9 % (ref 11.5–15.5)
WBC: 8 10*3/uL (ref 4.0–10.5)

## 2017-09-25 LAB — ABO/RH: ABO/RH(D): O POS

## 2017-09-25 NOTE — Pre-Procedure Instructions (Signed)
CT abd and chest 09/24/17 in epic

## 2017-09-25 NOTE — Telephone Encounter (Signed)
Told Ms Bromell that the CT did not show any  Evidence of cancer in her chest, abdomen or pelvis per Joylene John, NP. Will proceed with surgery on 10-02-17 as scheduled.

## 2017-09-25 NOTE — Pre-Procedure Instructions (Signed)
Left chart with Verena to follow-up with EKG results from 09/25/17.

## 2017-09-25 NOTE — Pre-Procedure Instructions (Signed)
CBC, CMP, UA results 09/25/2017 faxed to Dr. Denman George and Jerilynn Mages. Cross, NP via epic.

## 2017-09-27 LAB — CA 125: Cancer Antigen (CA) 125: 66.4 U/mL — ABNORMAL HIGH (ref 0.0–38.1)

## 2017-10-01 ENCOUNTER — Encounter: Payer: Self-pay | Admitting: Oncology

## 2017-10-01 ENCOUNTER — Telehealth: Payer: Self-pay | Admitting: Oncology

## 2017-10-01 DIAGNOSIS — C562 Malignant neoplasm of left ovary: Secondary | ICD-10-CM

## 2017-10-01 NOTE — Telephone Encounter (Signed)
Called Kristy Franklin, Patient Financial Advocate, to see when she will be able to meet with patient.  Lenise said she will meet with patient after her visit with Dr. Alvy Bimler on 10/16/17.

## 2017-10-01 NOTE — Progress Notes (Signed)
Called patient and notified her of appointment with Dr. Alvy Bimler on 10/16/17 at 2:30 pm.  She verbalized understanding and agreement.

## 2017-10-02 ENCOUNTER — Encounter (HOSPITAL_COMMUNITY): Payer: Self-pay | Admitting: Gynecologic Oncology

## 2017-10-02 ENCOUNTER — Ambulatory Visit (HOSPITAL_COMMUNITY): Payer: Self-pay | Admitting: Certified Registered Nurse Anesthetist

## 2017-10-02 ENCOUNTER — Encounter (HOSPITAL_COMMUNITY)
Admission: RE | Disposition: A | Discharge status: Home / Self Care | Payer: Self-pay | Source: Ambulatory Visit | Attending: Gynecologic Oncology

## 2017-10-02 ENCOUNTER — Ambulatory Visit (HOSPITAL_COMMUNITY)
Admission: RE | Admit: 2017-10-02 | Discharge: 2017-10-03 | Disposition: A | Discharge status: Home / Self Care | Payer: Self-pay | Source: Ambulatory Visit | Attending: Gynecologic Oncology | Admitting: Gynecologic Oncology

## 2017-10-02 ENCOUNTER — Other Ambulatory Visit: Payer: Self-pay

## 2017-10-02 DIAGNOSIS — Z79899 Other long term (current) drug therapy: Secondary | ICD-10-CM | POA: Insufficient documentation

## 2017-10-02 DIAGNOSIS — E669 Obesity, unspecified: Secondary | ICD-10-CM

## 2017-10-02 DIAGNOSIS — Z9079 Acquired absence of other genital organ(s): Secondary | ICD-10-CM | POA: Insufficient documentation

## 2017-10-02 DIAGNOSIS — C569 Malignant neoplasm of unspecified ovary: Secondary | ICD-10-CM | POA: Diagnosis present

## 2017-10-02 DIAGNOSIS — Z9049 Acquired absence of other specified parts of digestive tract: Secondary | ICD-10-CM | POA: Insufficient documentation

## 2017-10-02 DIAGNOSIS — C55 Malignant neoplasm of uterus, part unspecified: Secondary | ICD-10-CM | POA: Insufficient documentation

## 2017-10-02 DIAGNOSIS — Z8042 Family history of malignant neoplasm of prostate: Secondary | ICD-10-CM | POA: Insufficient documentation

## 2017-10-02 DIAGNOSIS — C562 Malignant neoplasm of left ovary: Secondary | ICD-10-CM | POA: Insufficient documentation

## 2017-10-02 DIAGNOSIS — Z90721 Acquired absence of ovaries, unilateral: Secondary | ICD-10-CM | POA: Insufficient documentation

## 2017-10-02 DIAGNOSIS — C775 Secondary and unspecified malignant neoplasm of intrapelvic lymph nodes: Secondary | ICD-10-CM | POA: Insufficient documentation

## 2017-10-02 DIAGNOSIS — Z79891 Long term (current) use of opiate analgesic: Secondary | ICD-10-CM | POA: Insufficient documentation

## 2017-10-02 DIAGNOSIS — I10 Essential (primary) hypertension: Secondary | ICD-10-CM | POA: Insufficient documentation

## 2017-10-02 DIAGNOSIS — Z6834 Body mass index (BMI) 34.0-34.9, adult: Secondary | ICD-10-CM | POA: Insufficient documentation

## 2017-10-02 DIAGNOSIS — R3915 Urgency of urination: Secondary | ICD-10-CM | POA: Diagnosis present

## 2017-10-02 DIAGNOSIS — C541 Malignant neoplasm of endometrium: Secondary | ICD-10-CM | POA: Insufficient documentation

## 2017-10-02 DIAGNOSIS — D259 Leiomyoma of uterus, unspecified: Secondary | ICD-10-CM | POA: Insufficient documentation

## 2017-10-02 HISTORY — PX: ROBOTIC PELVIC AND PARA-AORTIC LYMPH NODE DISSECTION: SHX6210

## 2017-10-02 HISTORY — PX: ROBOTIC ASSISTED TOTAL HYSTERECTOMY WITH BILATERAL SALPINGO OOPHERECTOMY: SHX6086

## 2017-10-02 HISTORY — PX: OMENTECTOMY: SHX5985

## 2017-10-02 LAB — URINALYSIS, ROUTINE W REFLEX MICROSCOPIC
Bilirubin Urine: NEGATIVE
GLUCOSE, UA: NEGATIVE mg/dL
Ketones, ur: NEGATIVE mg/dL
NITRITE: NEGATIVE
PH: 6 (ref 5.0–8.0)
Protein, ur: NEGATIVE mg/dL
Specific Gravity, Urine: 1.011 (ref 1.005–1.030)

## 2017-10-02 LAB — PREGNANCY, URINE: Preg Test, Ur: NEGATIVE

## 2017-10-02 LAB — TYPE AND SCREEN
ABO/RH(D): O POS
Antibody Screen: NEGATIVE

## 2017-10-02 SURGERY — HYSTERECTOMY, TOTAL, ROBOT-ASSISTED, LAPAROSCOPIC, WITH BILATERAL SALPINGO-OOPHORECTOMY
Anesthesia: General | Laterality: Right

## 2017-10-02 MED ORDER — ONDANSETRON HCL 4 MG/2ML IJ SOLN
4.0000 mg | Freq: Once | INTRAMUSCULAR | Status: AC | PRN
  Administered 2017-10-02: 4 mg via INTRAVENOUS

## 2017-10-02 MED ORDER — ACETAMINOPHEN 325 MG PO TABS: 325.0000 mg | ORAL_TABLET | ORAL | Status: DC | PRN

## 2017-10-02 MED ORDER — ONDANSETRON HCL 4 MG/2ML IJ SOLN
4.0000 mg | Freq: Once | INTRAMUSCULAR | Status: AC
  Administered 2017-10-02: 4 mg via INTRAVENOUS

## 2017-10-02 MED ORDER — FENTANYL CITRATE (PF) 100 MCG/2ML IJ SOLN
INTRAMUSCULAR | Status: AC
  Administered 2017-10-02: 25 ug via INTRAVENOUS
  Filled 2017-10-02: qty 2

## 2017-10-02 MED ORDER — PROPOFOL 10 MG/ML IV BOLUS
INTRAVENOUS | Status: DC | PRN
  Administered 2017-10-02: 150 mg via INTRAVENOUS

## 2017-10-02 MED ORDER — OXYCODONE HCL 5 MG/5ML PO SOLN
5.0000 mg | Freq: Once | ORAL | Status: DC | PRN
  Filled 2017-10-02: qty 5

## 2017-10-02 MED ORDER — SUGAMMADEX SODIUM 500 MG/5ML IV SOLN
INTRAVENOUS | Status: AC
  Filled 2017-10-02: qty 10

## 2017-10-02 MED ORDER — LACTATED RINGERS IR SOLN
Status: DC | PRN
  Administered 2017-10-02: 1000 mL

## 2017-10-02 MED ORDER — LISINOPRIL 10 MG PO TABS
10.0000 mg | ORAL_TABLET | Freq: Every day | ORAL | Status: DC
  Administered 2017-10-03: 10 mg via ORAL
  Filled 2017-10-02: qty 1

## 2017-10-02 MED ORDER — ONDANSETRON HCL 4 MG/2ML IJ SOLN: 4.0000 mg | Freq: Once | INTRAMUSCULAR | Status: DC | PRN

## 2017-10-02 MED ORDER — HYDROMORPHONE HCL 1 MG/ML IJ SOLN
0.2000 mg | INTRAMUSCULAR | Status: DC | PRN
  Administered 2017-10-02: 0.25 mg via INTRAVENOUS
  Filled 2017-10-02: qty 1

## 2017-10-02 MED ORDER — ENOXAPARIN SODIUM 40 MG/0.4ML ~~LOC~~ SOLN
40.0000 mg | SUBCUTANEOUS | Status: AC
  Administered 2017-10-02: 40 mg via SUBCUTANEOUS
  Filled 2017-10-02: qty 0.4

## 2017-10-02 MED ORDER — LACTATED RINGERS IV SOLN
INTRAVENOUS | Status: DC
  Administered 2017-10-02 (×2): via INTRAVENOUS

## 2017-10-02 MED ORDER — MEPERIDINE HCL 50 MG/ML IJ SOLN: 6.2500 mg | INTRAMUSCULAR | Status: DC | PRN

## 2017-10-02 MED ORDER — ROCURONIUM BROMIDE 10 MG/ML (PF) SYRINGE
PREFILLED_SYRINGE | INTRAVENOUS | Status: DC | PRN
  Administered 2017-10-02: 60 mg via INTRAVENOUS
  Administered 2017-10-02: 40 mg via INTRAVENOUS

## 2017-10-02 MED ORDER — LIDOCAINE HCL 2 % IJ SOLN
INTRAMUSCULAR | Status: AC
  Filled 2017-10-02: qty 40

## 2017-10-02 MED ORDER — TRAMADOL HCL 50 MG PO TABS
100.0000 mg | ORAL_TABLET | Freq: Four times a day (QID) | ORAL | Status: DC | PRN
  Administered 2017-10-02: 100 mg via ORAL
  Filled 2017-10-02: qty 2

## 2017-10-02 MED ORDER — ACETAMINOPHEN 160 MG/5ML PO SOLN: 325.0000 mg | ORAL | Status: DC | PRN

## 2017-10-02 MED ORDER — MIDAZOLAM HCL 5 MG/5ML IJ SOLN
INTRAMUSCULAR | Status: DC | PRN
  Administered 2017-10-02: 2 mg via INTRAVENOUS

## 2017-10-02 MED ORDER — OXYCODONE HCL 5 MG PO TABS: 5.0000 mg | ORAL_TABLET | Freq: Once | ORAL | Status: DC | PRN

## 2017-10-02 MED ORDER — KCL IN DEXTROSE-NACL 20-5-0.45 MEQ/L-%-% IV SOLN
INTRAVENOUS | Status: DC
  Administered 2017-10-02: 19:00:00 via INTRAVENOUS
  Filled 2017-10-02: qty 1000

## 2017-10-02 MED ORDER — PROPOFOL 10 MG/ML IV BOLUS
INTRAVENOUS | Status: AC
  Filled 2017-10-02: qty 20

## 2017-10-02 MED ORDER — STERILE WATER FOR INJECTION IJ SOLN
INTRAMUSCULAR | Status: AC
  Filled 2017-10-02: qty 10

## 2017-10-02 MED ORDER — MIDAZOLAM HCL 2 MG/2ML IJ SOLN
INTRAMUSCULAR | Status: AC
  Filled 2017-10-02: qty 2

## 2017-10-02 MED ORDER — FENTANYL CITRATE (PF) 250 MCG/5ML IJ SOLN
INTRAMUSCULAR | Status: AC
  Filled 2017-10-02: qty 5

## 2017-10-02 MED ORDER — KETAMINE HCL 10 MG/ML IJ SOLN
INTRAMUSCULAR | Status: DC | PRN
  Administered 2017-10-02: 40 mg via INTRAVENOUS
  Administered 2017-10-02: 10 mg via INTRAVENOUS

## 2017-10-02 MED ORDER — ONDANSETRON HCL 4 MG/2ML IJ SOLN
INTRAMUSCULAR | Status: DC | PRN
  Administered 2017-10-02: 4 mg via INTRAVENOUS

## 2017-10-02 MED ORDER — FENTANYL CITRATE (PF) 100 MCG/2ML IJ SOLN
25.0000 ug | INTRAMUSCULAR | Status: DC | PRN
  Administered 2017-10-02 (×2): 25 ug via INTRAVENOUS

## 2017-10-02 MED ORDER — CEFAZOLIN SODIUM-DEXTROSE 2-4 GM/100ML-% IV SOLN
2.0000 g | INTRAVENOUS | Status: AC
  Administered 2017-10-02: 2 g via INTRAVENOUS
  Filled 2017-10-02: qty 100

## 2017-10-02 MED ORDER — ONDANSETRON HCL 4 MG PO TABS: 4.0000 mg | ORAL_TABLET | Freq: Four times a day (QID) | ORAL | Status: DC | PRN

## 2017-10-02 MED ORDER — DEXAMETHASONE SODIUM PHOSPHATE 10 MG/ML IJ SOLN
INTRAMUSCULAR | Status: DC | PRN
  Administered 2017-10-02: 10 mg via INTRAVENOUS

## 2017-10-02 MED ORDER — ACETAMINOPHEN 500 MG PO TABS
1000.0000 mg | ORAL_TABLET | ORAL | Status: AC
  Administered 2017-10-02: 1000 mg via ORAL
  Filled 2017-10-02: qty 2

## 2017-10-02 MED ORDER — DEXAMETHASONE SODIUM PHOSPHATE 4 MG/ML IJ SOLN: 4.0000 mg | INTRAMUSCULAR | Status: DC

## 2017-10-02 MED ORDER — STERILE WATER FOR IRRIGATION IR SOLN
Status: DC | PRN
  Administered 2017-10-02: 1000 mL

## 2017-10-02 MED ORDER — ONDANSETRON HCL 4 MG/2ML IJ SOLN
INTRAMUSCULAR | Status: AC
  Filled 2017-10-02: qty 2

## 2017-10-02 MED ORDER — SUGAMMADEX SODIUM 200 MG/2ML IV SOLN
INTRAVENOUS | Status: DC | PRN
  Administered 2017-10-02: 400 mg via INTRAVENOUS

## 2017-10-02 MED ORDER — LIDOCAINE 2% (20 MG/ML) 5 ML SYRINGE
INTRAMUSCULAR | Status: DC | PRN
  Administered 2017-10-02: 1.5 mg/kg/h via INTRAVENOUS

## 2017-10-02 MED ORDER — OXYCODONE HCL 5 MG/5ML PO SOLN: 5.0000 mg | Freq: Once | ORAL | Status: DC | PRN

## 2017-10-02 MED ORDER — ENOXAPARIN SODIUM 40 MG/0.4ML ~~LOC~~ SOLN
40.0000 mg | SUBCUTANEOUS | Status: DC
  Administered 2017-10-03: 40 mg via SUBCUTANEOUS
  Filled 2017-10-02: qty 0.4

## 2017-10-02 MED ORDER — LIDOCAINE 2% (20 MG/ML) 5 ML SYRINGE
INTRAMUSCULAR | Status: DC | PRN
  Administered 2017-10-02: 60 mg via INTRAVENOUS

## 2017-10-02 MED ORDER — CELECOXIB 200 MG PO CAPS
400.0000 mg | ORAL_CAPSULE | ORAL | Status: AC
  Administered 2017-10-02: 400 mg via ORAL
  Filled 2017-10-02: qty 2

## 2017-10-02 MED ORDER — SCOPOLAMINE 1 MG/3DAYS TD PT72
1.0000 | MEDICATED_PATCH | TRANSDERMAL | Status: DC
  Administered 2017-10-02: 1.5 mg via TRANSDERMAL
  Filled 2017-10-02: qty 1

## 2017-10-02 MED ORDER — GABAPENTIN 300 MG PO CAPS
300.0000 mg | ORAL_CAPSULE | ORAL | Status: AC
  Administered 2017-10-02: 300 mg via ORAL
  Filled 2017-10-02: qty 1

## 2017-10-02 MED ORDER — OXYCODONE HCL 5 MG PO TABS
5.0000 mg | ORAL_TABLET | ORAL | Status: DC | PRN
  Administered 2017-10-02: 5 mg via ORAL
  Filled 2017-10-02: qty 1

## 2017-10-02 MED ORDER — FENTANYL CITRATE (PF) 100 MCG/2ML IJ SOLN: 25.0000 ug | INTRAMUSCULAR | Status: DC | PRN

## 2017-10-02 MED ORDER — SENNOSIDES-DOCUSATE SODIUM 8.6-50 MG PO TABS
2.0000 | ORAL_TABLET | Freq: Every day | ORAL | Status: DC
  Administered 2017-10-02: 2 via ORAL
  Filled 2017-10-02: qty 2

## 2017-10-02 MED ORDER — FENTANYL CITRATE (PF) 250 MCG/5ML IJ SOLN
INTRAMUSCULAR | Status: DC | PRN
  Administered 2017-10-02 (×3): 50 ug via INTRAVENOUS
  Administered 2017-10-02: 100 ug via INTRAVENOUS

## 2017-10-02 MED ORDER — ONDANSETRON HCL 4 MG/2ML IJ SOLN
4.0000 mg | Freq: Four times a day (QID) | INTRAMUSCULAR | Status: DC | PRN
  Administered 2017-10-02: 4 mg via INTRAVENOUS
  Filled 2017-10-02: qty 2

## 2017-10-02 MED ORDER — ACETAMINOPHEN 500 MG PO TABS
1000.0000 mg | ORAL_TABLET | Freq: Four times a day (QID) | ORAL | Status: DC
  Administered 2017-10-03: 1000 mg via ORAL
  Filled 2017-10-02: qty 2

## 2017-10-02 SURGICAL SUPPLY — 45 items
APPLICATOR SURGIFLO ENDO (HEMOSTASIS) IMPLANT
BAG LAPAROSCOPIC 12 15 PORT 16 (BASKET) IMPLANT
BAG RETRIEVAL 12/15 (BASKET)
COVER BACK TABLE 60X90IN (DRAPES) ×4 IMPLANT
COVER TIP SHEARS 8 DVNC (MISCELLANEOUS) ×3 IMPLANT
COVER TIP SHEARS 8MM DA VINCI (MISCELLANEOUS) ×1
DERMABOND ADVANCED (GAUZE/BANDAGES/DRESSINGS) ×1
DERMABOND ADVANCED .7 DNX12 (GAUZE/BANDAGES/DRESSINGS) ×3 IMPLANT
DRAPE ARM DVNC X/XI (DISPOSABLE) ×12 IMPLANT
DRAPE COLUMN DVNC XI (DISPOSABLE) ×3 IMPLANT
DRAPE DA VINCI XI ARM (DISPOSABLE) ×4
DRAPE DA VINCI XI COLUMN (DISPOSABLE) ×1
DRAPE SHEET LG 3/4 BI-LAMINATE (DRAPES) ×4 IMPLANT
DRAPE SURG IRRIG POUCH 19X23 (DRAPES) ×4 IMPLANT
ELECT REM PT RETURN 15FT ADLT (MISCELLANEOUS) ×4 IMPLANT
GLOVE BIO SURGEON STRL SZ 6 (GLOVE) ×16 IMPLANT
GLOVE BIO SURGEON STRL SZ 6.5 (GLOVE) ×4 IMPLANT
GOWN STRL REUS W/ TWL LRG LVL3 (GOWN DISPOSABLE) ×12 IMPLANT
GOWN STRL REUS W/TWL LRG LVL3 (GOWN DISPOSABLE) ×4
HOLDER FOLEY CATH W/STRAP (MISCELLANEOUS) ×4 IMPLANT
IRRIG SUCT STRYKERFLOW 2 WTIP (MISCELLANEOUS) ×4
IRRIGATION SUCT STRKRFLW 2 WTP (MISCELLANEOUS) ×3 IMPLANT
KIT PROCEDURE DA VINCI SI (MISCELLANEOUS)
KIT PROCEDURE DVNC SI (MISCELLANEOUS) IMPLANT
MANIPULATOR UTERINE 4.5 ZUMI (MISCELLANEOUS) ×4 IMPLANT
NEEDLE SPNL 18GX3.5 QUINCKE PK (NEEDLE) IMPLANT
OBTURATOR OPTICAL STANDARD 8MM (TROCAR) ×1
OBTURATOR OPTICAL STND 8 DVNC (TROCAR) ×3
OBTURATOR OPTICALSTD 8 DVNC (TROCAR) ×3 IMPLANT
PACK ROBOT GYN CUSTOM WL (TRAY / TRAY PROCEDURE) ×4 IMPLANT
PAD POSITIONING PINK XL (MISCELLANEOUS) ×4 IMPLANT
PORT ACCESS TROCAR AIRSEAL 12 (TROCAR) ×3 IMPLANT
PORT ACCESS TROCAR AIRSEAL 5M (TROCAR) ×1
POUCH SPECIMEN RETRIEVAL 10MM (ENDOMECHANICALS) ×12 IMPLANT
SEAL CANN UNIV 5-8 DVNC XI (MISCELLANEOUS) ×12 IMPLANT
SEAL XI 5MM-8MM UNIVERSAL (MISCELLANEOUS) ×4
SEALER VESSEL DA VINCI XI (MISCELLANEOUS) ×1
SEALER VESSEL EXT DVNC XI (MISCELLANEOUS) ×3 IMPLANT
SET TRI-LUMEN FLTR TB AIRSEAL (TUBING) ×4 IMPLANT
SUT VIC AB 0 CT1 27 (SUTURE)
SUT VIC AB 0 CT1 27XBRD ANTBC (SUTURE) IMPLANT
TOWEL OR NON WOVEN STRL DISP B (DISPOSABLE) ×4 IMPLANT
TRAP SPECIMEN MUCOUS 40CC (MISCELLANEOUS) ×4 IMPLANT
TRAY FOLEY MTR SLVR 16FR STAT (SET/KITS/TRAYS/PACK) ×4 IMPLANT
UNDERPAD 30X30 (UNDERPADS AND DIAPERS) ×4 IMPLANT

## 2017-10-02 NOTE — Progress Notes (Signed)
Spoke to [employer], Software engineer via phone concerning pork allergy. I spoke to pt to obtain more information on reaction to pork consumption. Pt states that she breaks out in a rash. Pt denied sob, throat swelling, or chest pain after consumption. Drew verified that pt could have lovenox injection based off of this information before surgery.

## 2017-10-02 NOTE — Interval H&P Note (Signed)
History and Physical Interval Note:  10/02/2017 9:56 AM  Kristy Franklin  has presented today for surgery, with the diagnosis of OVARIAN CANCER  The various methods of treatment have been discussed with the patient and family. After consideration of risks, benefits and other options for treatment, the patient has consented to  Procedure(s): XI ROBOTIC ASSISTED TOTAL HYSTERECTOMY WITH RIGHT SALPINGO OOPHORECTOMY (Right) OMENTECTOMY WITH BIOPSY (N/A) XI ROBOTIC PELVIC AND PARA-AORTIC LYMPH NODE DISSECTION (N/A) as a surgical intervention .  The patient's history has been reviewed, patient examined, no change in status, stable for surgery.  I have reviewed the patient's chart and labs.  Questions were answered to the patient's satisfaction.     Thereasa Solo

## 2017-10-02 NOTE — Addendum Note (Signed)
Addendum  created 10/02/17 1639 by Janeece Riggers, MD   Order list changed, Order sets accessed

## 2017-10-02 NOTE — Progress Notes (Signed)
Dr. Kalman Shan notified that pt has had pain in collar bone area intermittently x1 month. Dr. Denman George notified that pt has had rectum pain x1 month and that she has also had urgency with urination since yesterday.

## 2017-10-02 NOTE — Plan of Care (Signed)
Pt received from PACU 1739 w/ c/o nausea. Sitting up in bed with no additional complaints. States pain 4/10 and is ready for pain med when available. A&Ox4. Will continue to monitor.

## 2017-10-02 NOTE — Anesthesia Preprocedure Evaluation (Signed)
Anesthesia Evaluation  Patient identified by MRN, date of birth, ID band Patient awake    Reviewed: Allergy & Precautions, NPO status , Patient's Chart, lab work & pertinent test results  Airway Mallampati: II  TM Distance: >3 FB Neck ROM: Full    Dental no notable dental hx.    Pulmonary neg pulmonary ROS,    Pulmonary exam normal breath sounds clear to auscultation       Cardiovascular hypertension, Normal cardiovascular exam Rhythm:Regular Rate:Normal     Neuro/Psych negative neurological ROS  negative psych ROS   GI/Hepatic negative GI ROS, Neg liver ROS,   Endo/Other  negative endocrine ROS  Renal/GU negative Renal ROS  negative genitourinary   Musculoskeletal negative musculoskeletal ROS (+)   Abdominal   Peds negative pediatric ROS (+)  Hematology negative hematology ROS (+)   Anesthesia Other Findings   Reproductive/Obstetrics negative OB ROS                            Anesthesia Physical Anesthesia Plan  ASA: II  Anesthesia Plan: General   Post-op Pain Management:    Induction: Intravenous  PONV Risk Score and Plan: 3 and Ondansetron, Dexamethasone, Treatment may vary due to age or medical condition and Scopolamine patch - Pre-op  Airway Management Planned: Oral ETT  Additional Equipment:   Intra-op Plan:   Post-operative Plan: Extubation in OR  Informed Consent: I have reviewed the patients History and Physical, chart, labs and discussed the procedure including the risks, benefits and alternatives for the proposed anesthesia with the patient or authorized representative who has indicated his/her understanding and acceptance.     Dental advisory given  Plan Discussed with: CRNA and Surgeon  Anesthesia Plan Comments:         Anesthesia Quick Evaluation  

## 2017-10-02 NOTE — Anesthesia Postprocedure Evaluation (Signed)
Anesthesia Post Note  Patient: Kristy Franklin  Procedure(s) Performed: XI ROBOTIC ASSISTED TOTAL HYSTERECTOMY WITH RIGHT SALPINGO OOPHORECTOMY (Right ) OMENTECTOMY (N/A ) XI ROBOTIC PELVIC LYMPH NODE DISSECTION (Bilateral )     Patient location during evaluation: PACU Anesthesia Type: General Level of consciousness: awake and alert Pain management: pain level controlled Vital Signs Assessment: post-procedure vital signs reviewed and stable Respiratory status: spontaneous breathing, nonlabored ventilation, respiratory function stable and patient connected to nasal cannula oxygen Cardiovascular status: blood pressure returned to baseline and stable Postop Assessment: no apparent nausea or vomiting Anesthetic complications: no    Last Vitals:  Vitals:   10/02/17 0953  BP: (!) 153/87  Pulse: 72  Resp: 16  Temp: 37.1 C  SpO2: 99%    Last Pain:  Vitals:   10/02/17 1024  TempSrc:   PainSc: 4                  Izzie Geers

## 2017-10-02 NOTE — Anesthesia Procedure Notes (Signed)
Procedure Name: Intubation Date/Time: 10/02/2017 1:30 PM Performed by: Mitzie Na, CRNA Pre-anesthesia Checklist: Patient identified, Emergency Drugs available, Suction available, Patient being monitored and Timeout performed Patient Re-evaluated:Patient Re-evaluated prior to induction Oxygen Delivery Method: Circle system utilized Preoxygenation: Pre-oxygenation with 100% oxygen Induction Type: IV induction Ventilation: Mask ventilation without difficulty Laryngoscope Size: Mac and 3 Grade View: Grade I Tube type: Oral Tube size: 7.0 mm Number of attempts: 1 Airway Equipment and Method: Stylet Placement Confirmation: ETT inserted through vocal cords under direct vision,  positive ETCO2 and breath sounds checked- equal and bilateral Secured at: 23 cm Tube secured with: Tape Dental Injury: Teeth and Oropharynx as per pre-operative assessment

## 2017-10-02 NOTE — Op Note (Signed)
OPERATIVE NOTE 10/02/17  Surgeon: Donaciano Eva   Assistants: Dr Lahoma Crocker (an MD assistant was necessary for tissue manipulation, management of robotic instrumentation, retraction and positioning due to the complexity of the case and hospital policies).   Anesthesia: General endotracheal anesthesia  ASA Class: 3   Pre-operative Diagnosis: clinical stage IC ovarian cancer  Post-operative Diagnosis: same  Operation: Robotic-assisted laparoscopic total hysterectomy with bilateral salpingoophorectomy, omentectomy, bilateral pelvic lymphadenectomy  Surgeon: Donaciano Eva  Assistant Surgeon: Lahoma Crocker MD  Anesthesia: GET  Urine Output: 300cc  Operative Findings:  : 6cm grossly normal uterus (with pedunculated 1cm fibroid), normal appearing right tube and ovary. There was a thin layer of apparent residual left ovary adherent to the uterus/left ovarian fossa (completely resected with this surgery). No ascites, normal appendix and small bowel. Normal appearing omentum. No suspicious nodes.  No residual visible cancer remaining at the completion of the surgery.   Estimated Blood Loss:  less than 50 mL      Total IV Fluids: 700 ml         Specimens: washings, omentum, uterus with cervix and right tube and ovary, right and left pelvic lymph nodes.          Complications:  None; patient tolerated the procedure well.         Disposition: PACU - hemodynamically stable.  Procedure Details  The patient was seen in the Holding Room. The risks, benefits, complications, treatment options, and expected outcomes were discussed with the patient.  The patient concurred with the proposed plan, giving informed consent.  The site of surgery properly noted/marked. The patient was identified as Kristy Franklin and the procedure verified as a Robotic-assisted hysterectomy with bilateral salpingo oophorectomy, omentectomy, lymphadenectomy. A Time Out was held and the above  information confirmed.  After induction of anesthesia, the patient was draped and prepped in the usual sterile manner. Pt was placed in supine position after anesthesia and draped and prepped in the usual sterile manner. The abdominal drape was placed after the CholoraPrep had been allowed to dry for 3 minutes.  Her arms were tucked to her side with all appropriate precautions.  The shoulders were stabilized with padded shoulder blocks applied to the acromium processes.  The patient was placed in the semi-lithotomy position in [place].  The perineum was prepped with Betadine. The patient was then prepped. Foley catheter was placed.  A sterile speculum was placed in the vagina.  The cervix was grasped with a single-tooth tenaculum and dilated with Kennon Rounds dilators.  The ZUMI uterine manipulator with a medium colpotomizer ring was placed without difficulty.  A pneum occluder balloon was placed over the manipulator.  OG tube placement was confirmed and to suction.   Next, a 5 mm skin incision was made 1 cm below the subcostal margin in the midclavicular line.  The 5 mm Optiview port and scope was used for direct entry.  Opening pressure was under 10 mm CO2.  The abdomen was insufflated and the findings were noted as above.   At this point and all points during the procedure, the patient's intra-abdominal pressure did not exceed 15 mmHg. Next, a 10 mm skin incision was made in the umbilicus and a right and left port was placed about 10 cm lateral to the robot port on the right and left side.  A fourth arm was placed in the left lower quadrant 2 cm above and superior and medial to the anterior superior iliac spine.  All ports were placed under direct visualization.  The patient was placed in steep Trendelenburg.  Bowel was folded away into the upper abdomen.  The robot was docked in the normal manner.  The omentectomy was then performed. The omentum was delivered into the mid abdomen and elevated. The parietal  peritonum that attaches the omentum to the transverse colon was incised facilitating separation of the mid portion of the omentum from the transverse colon. The right sided omentum with its vascular pedicles was then skeletonized in its attachments to the right transverse colon. These vascular pedicles were bipolar sealed and transected. Observation of the colonic wall occurred throughout. The dissection then moved progressively along the colon the the left splenic flexure of the transverse colon. The bipolar sealing forceps and the scissors were used to skeletonize vascular omental pedicles, seal them and transect them until the entire infracolic omentum had been freed from its transverse colonic attachments to the splenic flexure. The omentum was then placed in an endocatch bag and retrieved from the vagina later in the case.  The hysterectomy was started after the round ligament on the right side was incised and the retroperitoneum was entered and the pararectal space was developed.  The ureter was noted to be on the medial leaf of the broad ligament.  The peritoneum above the ureter was incised and stretched and the infundibulopelvic ligament was skeletonized, cauterized and cut.  The posterior peritoneum was taken down to the level of the KOH ring.  The anterior peritoneum was also taken down.  The bladder flap was created to the level of the KOH ring.  The uterine artery on the right side was skeletonized, cauterized and cut in the normal manner.  A similar procedure was performed on the left, though the ovary had been removed and there was some adhesive disease between the left uterus and sigmoid colon. There was a rind of residual ovarian tissue versus scar remaining on the utero-ovarian ligament. This was completely resected with the uterus. The colpotomy was made and the uterus, cervix, left ovary and tube were amputated and delivered through the vagina.  Pedicles were inspected and excellent hemostasis  was achieved.    The paravesical space was developed with monopolar and sharp dissection. It was held open with tension on the median umbilical ligament with the forth arm. The paravesical space was opened with blunt and sharp dissection to mobilize the ureter off of the medial surface of the internal iliac artery. The medial leaf of the broad ligament containing the ureter was held medially (opening the pararectal space) by the assistant's grasper. The left pelvic lymphadenectomy was performed by skeletonizing the internal iliac artery at the bifurcation with the external iliac artery. The obturator nerve was identified in the base of lateral paravesical space. The ureter was mobilized medially off of the dissection by developing the pararectal space. The genitofemoral nerve was identified, skeletonized and mobilized laterally off of the external iliac artery. An enbloc resection of lymph nodes was performed within the following boundaries: the mid portion of the common iliac proximally, the circumflex iliac vein distally, the obturator nerve posteriorally, the genitofemoral nerve laterally. The nodal basin (including obturator space) were confirmed to be empty of nodes and hemostatic. The nodes were placed in an endocatch bag and retrieved vaginally.  The paravesical space was developed with monopolar and sharp dissection. It was held open with tension on the median umbilical ligament with the forth arm. The paravesical space was opened with blunt and sharp  dissection to mobilize the ureter off of the medial surface of the internal iliac artery. The medial leaf of the broad ligament containing the ureter was held medially (opening the pararectal space) by the assistant's grasper. The right pelvic lymphadenectomy was performed by skeletonizing the internal iliac artery at the bifurcation with the external iliac artery. The obturator nerve was identified in the base of lateral paravesical space. The ureter was  mobilized medially off of the dissection by developing the pararectal space. The genitofemoral nerve was identified, skeletonized and mobilized laterally off of the external iliac artery. An enbloc resection of lymph nodes was performed within the following boundaries: the mid portion of the common iliac proximally, the circumflex iliac vein distally, the obturator nerve posteriorally, the genitofemoral nerve laterally. The nodal basin (including obturator space) were confirmed to be empty of nodes and hemostatic. The nodes were placed in an endocatch bag and retrieved vaginally.  The colpotomy at the vaginal cuff was closed with Vicryl on a CT1 needle in a running manner.  Irrigation was used and excellent hemostasis was achieved.  At this point in the procedure was completed.  Robotic instruments were removed under direct visulaization.  The robot was undocked. The 10 mm ports were closed with Vicryl on a UR-5 needle and the fascia was closed with 0 Vicryl on a UR-5 needle.  The skin was closed with 4-0 Vicryl in a subcuticular manner.  Dermabond was applied.  Sponge, lap and needle counts correct x 2.  The patient was taken to the recovery room in stable condition.  The vagina was swabbed with  minimal bleeding noted.   All instrument and needle counts were correct x  3.   The patient was transferred to the recovery room in a stable condition.  Donaciano Eva, MD

## 2017-10-02 NOTE — Transfer of Care (Signed)
Immediate Anesthesia Transfer of Care Note  Patient: Kristy Franklin  Procedure(s) Performed: XI ROBOTIC ASSISTED TOTAL HYSTERECTOMY WITH RIGHT SALPINGO OOPHORECTOMY (Right ) OMENTECTOMY (N/A ) XI ROBOTIC PELVIC LYMPH NODE DISSECTION (Bilateral )  Patient Location: PACU  Anesthesia Type:General  Level of Consciousness: awake, alert , oriented and patient cooperative  Airway & Oxygen Therapy: Patient Spontanous Breathing and Patient connected to face mask oxygen  Post-op Assessment: Report given to RN, Post -op Vital signs reviewed and stable and Patient moving all extremities  Post vital signs: Reviewed and stable  Last Vitals:  Vitals Value Taken Time  BP 148/71 10/02/2017  4:03 PM  Temp    Pulse 70 10/02/2017  4:06 PM  Resp 11 10/02/2017  4:06 PM  SpO2 98 % 10/02/2017  4:06 PM  Vitals shown include unvalidated device data.  Last Pain:  Vitals:   10/02/17 1024  TempSrc:   PainSc: 4       Patients Stated Pain Goal: 4 (30/05/11 0211)  Complications: No apparent anesthesia complications

## 2017-10-02 NOTE — Progress Notes (Signed)
Lab notified that urine culture was added to orders.

## 2017-10-03 ENCOUNTER — Encounter (HOSPITAL_COMMUNITY): Payer: Self-pay | Admitting: Gynecologic Oncology

## 2017-10-03 LAB — CBC
HEMATOCRIT: 37 % (ref 36.0–46.0)
Hemoglobin: 12.1 g/dL (ref 12.0–15.0)
MCH: 26.8 pg (ref 26.0–34.0)
MCHC: 32.7 g/dL (ref 30.0–36.0)
MCV: 82 fL (ref 78.0–100.0)
Platelets: 428 10*3/uL — ABNORMAL HIGH (ref 150–400)
RBC: 4.51 MIL/uL (ref 3.87–5.11)
RDW: 13.9 % (ref 11.5–15.5)
WBC: 15.5 10*3/uL — ABNORMAL HIGH (ref 4.0–10.5)

## 2017-10-03 LAB — BASIC METABOLIC PANEL
Anion gap: 8 (ref 5–15)
BUN: 10 mg/dL (ref 6–20)
CALCIUM: 9.6 mg/dL (ref 8.9–10.3)
CO2: 25 mmol/L (ref 22–32)
CREATININE: 0.66 mg/dL (ref 0.44–1.00)
Chloride: 105 mmol/L (ref 98–111)
GFR calc Af Amer: >60 mL/min (ref >60)
GFR calc non Af Amer: >60 mL/min (ref >60)
GLUCOSE: 154 mg/dL — AB (ref 70–99)
Potassium: 4.2 mmol/L (ref 3.5–5.1)
Sodium: 138 mmol/L (ref 135–145)

## 2017-10-03 LAB — URINE CULTURE: Culture: NO GROWTH

## 2017-10-03 MED ORDER — ONDANSETRON HCL 4 MG PO TABS: 4.0000 mg | ORAL_TABLET | Freq: Four times a day (QID) | ORAL | 0 refills | Status: DC | PRN

## 2017-10-03 MED ORDER — OXYCODONE HCL 5 MG PO TABS: 5.0000 mg | ORAL_TABLET | ORAL | 0 refills | Status: DC | PRN

## 2017-10-03 MED ORDER — ONDANSETRON HCL 4 MG/2ML IJ SOLN: 4.0000 mg | Freq: Four times a day (QID) | INTRAMUSCULAR | 0 refills | Status: DC | PRN

## 2017-10-03 MED ORDER — SENNOSIDES-DOCUSATE SODIUM 8.6-50 MG PO TABS: 2.0000 | ORAL_TABLET | Freq: Every day | ORAL | 0 refills | Status: DC

## 2017-10-03 MED ORDER — ACETAMINOPHEN 500 MG PO TABS: 1000.0000 mg | ORAL_TABLET | Freq: Four times a day (QID) | ORAL | 0 refills | Status: DC

## 2017-10-03 MED ORDER — ESTROGENS CONJUGATED 0.625 MG PO TABS: 0.6250 mg | ORAL_TABLET | Freq: Every day | ORAL | 11 refills | Status: DC

## 2017-10-03 NOTE — Discharge Instructions (Signed)
10/02/2017  Return to work: 4 weeks  Activity: 1. Be up and out of the bed during the day.  Take a nap if needed.  You may walk up steps but be careful and use the hand rail.  Stair climbing will tire you more than you think, you may need to stop part way and rest.   2. No lifting or straining for 6 weeks.  3. No driving for 1 weeks.  Do Not drive if you are taking narcotic pain medicine.  4. Shower daily.  Use soap and water on your incision and pat dry; don't rub.   5. No sexual activity and nothing in the vagina for 8 weeks.  Medications:  - Take ibuprofen and tylenol first line for pain control. Take these regularly (every 6 hours) to decrease the build up of pain.  - If necessary, for severe pain not relieved by ibuprofen, take percocet.  - While taking percocet you should take sennakot every night to reduce the likelihood of constipation. If this causes diarrhea, stop its use.  - take premarin tablet (hormone replacement) once per day until age 48.  Diet: 1. Low sodium Heart Healthy Diet is recommended.  2. It is safe to use a laxative if you have difficulty moving your bowels.   Wound Care: 1. Keep clean and dry.  Shower daily.  Reasons to call the Doctor:   Fever - Oral temperature greater than 100.4 degrees Fahrenheit  Foul-smelling vaginal discharge  Difficulty urinating  Nausea and vomiting  Increased pain at the site of the incision that is unrelieved with pain medicine.  Difficulty breathing with or without chest pain  New calf pain especially if only on one side  Sudden, continuing increased vaginal bleeding with or without clots.   Follow-up: 1. See Everitt Amber in 3-4 weeks.  Contacts: For questions or concerns you should contact:  Dr. Everitt Amber at [phone] After hours and on week-ends call [phone] and ask to speak to the physician on call for Gynecologic Oncology

## 2017-10-03 NOTE — Plan of Care (Signed)
Pt a&o, sitting up in bed this am. Has ambulated once in hall this morning with plans to ambulate again shortly. No complaints of pain and no needs at this time. Will continue to monitor.

## 2017-10-03 NOTE — Progress Notes (Signed)
Discharge instructions reviewed and copy given to patient. IV d/c'd. SCD's removed. Pt ready for discharge.

## 2017-10-03 NOTE — Discharge Summary (Signed)
Physician Discharge Summary  Patient ID: Kristy Franklin MRN: [id number] DOB/AGE: 03-Sep-1970 47 y.o.  Admit date: 10/02/2017 Discharge date: 10/03/2017  Admission Diagnoses: Ovarian cancer on left [facility])  Discharge Diagnoses:  Principal Problem:   Ovarian cancer on left [facility]) Active Problems:   Obesity   Urinary urgency   Ovarian cancer [facility])   Discharged Condition: good  Hospital Course:  1/ patient was admitted on 10/02/17 for a robotic assisted total hysterectomy, RSO, omentectomy, pelvic lymphadenectomy for ovarian cancer. 2/ surgery was uncomplicated  3/ on postoperative day 1 the patient was meeting discharge criteria: tolerating PO, voiding urine, ambulating, pain well controlled on oral medications.  4/ new medications on discharge include premarin, percocet, sennakot. She will be referred for chemotherapy postop.  Consults: None  Significant Diagnostic Studies: labs:  CBC    Component Value Date/Time   WBC 15.5 (H) 10/03/2017 0510   RBC 4.51 10/03/2017 0510   HGB 12.1 10/03/2017 0510   HCT 37.0 10/03/2017 0510   PLT 428 (H) 10/03/2017 0510   MCV 82.0 10/03/2017 0510   MCH 26.8 10/03/2017 0510   MCHC 32.7 10/03/2017 0510   RDW 13.9 10/03/2017 0510     Treatments: surgery: see above  Discharge Exam: Blood pressure (!) 115/56, pulse 60, temperature 98.9 F (37.2 C), temperature source Oral, resp. rate 18, height 5\' 3"  (1.6 m), weight 193 lb (87.5 kg), last menstrual period 09/24/2017, SpO2 97 %. General appearance: alert and cooperative GI: soft, non-tender; bowel sounds normal; no masses,  no organomegaly Incision/Wound: clean, dry and intact x5  Disposition:    Allergies as of 10/03/2017      Reactions   Other Rash   Pork      Medication List    TAKE these medications   acetaminophen 500 MG tablet Commonly known as:  TYLENOL Take 2 tablets (1,000 mg total) by mouth every 6 (six) hours.   estrogens (conjugated) 0.625 MG tablet Commonly known as:   PREMARIN Take 1 tablet (0.625 mg total) by mouth daily. Take daily for 21 days then do not take for 7 days.   FISH OIL PO Take 1 capsule by mouth every 3 (three) days.   HYDROcodone-acetaminophen 5-325 MG tablet Commonly known as:  NORCO/VICODIN Take 1 tablet by mouth 2 (two) times daily as needed for severe pain.   ibuprofen 200 MG tablet Commonly known as:  ADVIL,MOTRIN Take 200 mg by mouth daily as needed for headache or moderate pain.   lisinopril 10 MG tablet Commonly known as:  PRINIVIL,ZESTRIL Take 10 mg by mouth daily.   mometasone 0.1 % cream Commonly known as:  ELOCON Apply 1 application topically 2 (two) times daily as needed for rash.   ondansetron 4 MG tablet Commonly known as:  ZOFRAN Take 1 tablet (4 mg total) by mouth every 6 (six) hours as needed for nausea.   ondansetron 4 MG/2ML Soln injection Commonly known as:  ZOFRAN Inject 2 mLs (4 mg total) into the vein every 6 (six) hours as needed for nausea.   oxyCODONE 5 MG immediate release tablet Commonly known as:  Oxy IR/ROXICODONE Take 1 tablet (5 mg total) by mouth every 4 (four) hours as needed for severe pain or breakthrough pain.   polyethylene glycol packet Commonly known as:  MIRALAX / GLYCOLAX Take 17 g by mouth daily as needed for moderate constipation.   senna-docusate 8.6-50 MG tablet Commonly known as:  Senokot-S Take 2 tablets by mouth at bedtime.      Follow-up Information  Heath Lark, MD Follow up on 10/08/2017.   Specialty:  Hematology and Oncology Contact information: [place] [id number] [phone]        Everitt Amber, MD Follow up in 3 week(s).   Specialty:  Gynecologic Oncology Contact information: [place] [address] [phone]           Signed: Thereasa Solo 10/03/2017, 9:41 AM

## 2017-10-08 ENCOUNTER — Encounter: Payer: Self-pay | Admitting: Gynecologic Oncology

## 2017-10-09 ENCOUNTER — Other Ambulatory Visit (HOSPITAL_COMMUNITY)
Admission: RE | Admit: 2017-10-09 | Discharge: 2017-10-09 | Disposition: A | Discharge status: Home / Self Care | Payer: Self-pay | Source: Ambulatory Visit | Attending: Gynecologic Oncology | Admitting: Gynecologic Oncology

## 2017-10-09 ENCOUNTER — Telehealth: Payer: Self-pay

## 2017-10-09 ENCOUNTER — Inpatient Hospital Stay: Payer: Self-pay | Attending: Gynecologic Oncology

## 2017-10-09 DIAGNOSIS — Z8042 Family history of malignant neoplasm of prostate: Secondary | ICD-10-CM | POA: Insufficient documentation

## 2017-10-09 DIAGNOSIS — R300 Dysuria: Secondary | ICD-10-CM | POA: Insufficient documentation

## 2017-10-09 DIAGNOSIS — Z5111 Encounter for antineoplastic chemotherapy: Secondary | ICD-10-CM | POA: Insufficient documentation

## 2017-10-09 DIAGNOSIS — R748 Abnormal levels of other serum enzymes: Secondary | ICD-10-CM | POA: Insufficient documentation

## 2017-10-09 DIAGNOSIS — C562 Malignant neoplasm of left ovary: Secondary | ICD-10-CM | POA: Insufficient documentation

## 2017-10-09 LAB — URINALYSIS, ROUTINE W REFLEX MICROSCOPIC
Bilirubin Urine: NEGATIVE
Glucose, UA: NEGATIVE mg/dL
Hgb urine dipstick: NEGATIVE
KETONES UR: 5 mg/dL — AB
LEUKOCYTES UA: NEGATIVE
NITRITE: NEGATIVE
Protein, ur: NEGATIVE mg/dL
SPECIFIC GRAVITY, URINE: 1.009 (ref 1.005–1.030)
pH: 7 (ref 5.0–8.0)

## 2017-10-09 NOTE — Telephone Encounter (Signed)
Told Kristy Franklin that Dr. Denman George felt that the slightly elevated marker was due to the recent surgery she had prior to the marker being drawn. She will be seeing Dr. Alvy Bimler  to discuss adjuvant  Chemotherapy due to high grade tumor.  The time frame to repeat marker will be determined by  Dr. Denman George or Dr. Alvy Bimler to see if it remains high.

## 2017-10-09 NOTE — Telephone Encounter (Signed)
Kristy Franklin is having urinary burning and frequency. Bladder spasms and some chills. Onset yesterday. Afebrile. Pt to come in for a U/A C&S today at 3:30 pm. Will f/u with results per Joylene John, NP.

## 2017-10-10 ENCOUNTER — Inpatient Hospital Stay: Payer: Self-pay

## 2017-10-10 ENCOUNTER — Inpatient Hospital Stay (HOSPITAL_BASED_OUTPATIENT_CLINIC_OR_DEPARTMENT_OTHER): Payer: Self-pay | Admitting: Genetic Counselor

## 2017-10-10 ENCOUNTER — Encounter: Payer: Self-pay | Admitting: Genetic Counselor

## 2017-10-10 ENCOUNTER — Telehealth: Payer: Self-pay | Admitting: *Deleted

## 2017-10-10 DIAGNOSIS — Z8042 Family history of malignant neoplasm of prostate: Secondary | ICD-10-CM

## 2017-10-10 DIAGNOSIS — C562 Malignant neoplasm of left ovary: Secondary | ICD-10-CM

## 2017-10-10 DIAGNOSIS — R300 Dysuria: Secondary | ICD-10-CM

## 2017-10-10 NOTE — Progress Notes (Signed)
REFERRING PROVIDER: Everitt Amber, MD [place], [place] [address]  PRIMARY PROVIDER:  Raiford Simmonds., PA-C  PRIMARY REASON FOR VISIT:  1. 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 Family history of prostate cancer      HISTORY OF PRESENT ILLNESS:   Kristy Franklin, a 47 y.o. female, was seen for a [place] cancer genetics consultation at the request of Dr. Denman George due to a personal and family history of cancer.  Kristy Franklin presents to clinic today to discuss the possibility of a hereditary predisposition to cancer, genetic testing, and to further clarify her future cancer risks, as well as potential cancer risks for family members.   In 2019, at the age of 70, Kristy Franklin was diagnosed with cancer of the left ovary. At this time, this has been treated with surgery.     CANCER HISTORY:   No history exists.     HORMONAL RISK FACTORS:  Menarche was at age 2.  First live birth at age 68.  OCP use for approximately 2 years.  Ovaries intact: no.  Hysterectomy: yes.  Menopausal status: postmenopausal.  HRT use: 0 years. Colonoscopy: no; not examined. Mammogram within the last year: yes. Number of breast biopsies: 0. Up to date with pelvic exams:  yes. Any excessive radiation exposure in the past:  no  Past Medical History:  Diagnosis Date  . Abdominal pain    Right mid only with bowel movements  . Blood in urine   . Change in bowel movement    more green color  . Family history of prostate cancer   . GERD (gastroesophageal reflux disease)   . Headache    after menstrual cycle every month  . History of gallstones   . Hypertension   . Neck pain on right side    pulsating  . Numbness and tingling of right leg   . Obesity   . Ovarian cancer, left ([place])   . PPD positive   . Skin rash    Chest, forehead, left arm    Past Surgical History:  Procedure Laterality Date  . CESAREAN SECTION     x2  . CHOLECYSTECTOMY     20 years ago  . OMENTECTOMY N/A  10/02/2017   Procedure: OMENTECTOMY;  Surgeon: Everitt Amber, MD;  Location: [facility];  Service: Gynecology;  Laterality: N/A;  . OTHER SURGICAL HISTORY  07/2017   Left ovary removed  . ROBOTIC ASSISTED TOTAL HYSTERECTOMY WITH BILATERAL SALPINGO OOPHERECTOMY Right 10/02/2017   Procedure: XI ROBOTIC ASSISTED TOTAL HYSTERECTOMY WITH RIGHT SALPINGO OOPHORECTOMY;  Surgeon: Everitt Amber, MD;  Location: [facility];  Service: Gynecology;  Laterality: Right;  . ROBOTIC PELVIC AND PARA-AORTIC LYMPH NODE DISSECTION Bilateral 10/02/2017   Procedure: XI ROBOTIC PELVIC LYMPH NODE DISSECTION;  Surgeon: Everitt Amber, MD;  Location: [facility];  Service: Gynecology;  Laterality: Bilateral;    Social History   Socioeconomic History  . Marital status: Married    Spouse name: Not on file  . Number of children: Not on file  . Years of education: Not on file  . Highest education level: Not on file  Occupational History  . Not on file  Social Needs  . Financial resource strain: Not on file  . Food insecurity:    Worry: Not on file    Inability: Not on file  . Transportation needs:    Medical: Not on file    Non-medical: Not on file  Tobacco Use  . Smoking status:  Never Smoker  . Smokeless tobacco: Never Used  Substance and Sexual Activity  . Alcohol use: Never    Frequency: Never  . Drug use: Never  . Sexual activity: Not on file    Comment: calendar  Lifestyle  . Physical activity:    Days per week: Not on file    Minutes per session: Not on file  . Stress: Not on file  Relationships  . Social connections:    Talks on phone: Not on file    Gets together: Not on file    Attends religious service: Not on file    Active member of club or organization: Not on file    Attends meetings of clubs or organizations: Not on file    Relationship status: Not on file  Other Topics Concern  . Not on file  Social History Narrative  . Not on file     FAMILY HISTORY:  We obtained a detailed, 4-generation family  history.  Significant diagnoses are listed below: Family History  Problem Relation Age of Onset  . Hypertension Mother   . Prostate cancer Father 39       d. 66  . Diabetes Paternal Aunt   . Heart attack Paternal Aunt   . Heart attack Paternal Grandmother 95  . Thyroid disease Other 14    The patient has a son and daughter who are cancer free.  She has two brothers and three sisters, all who are cancer free.  Her father is deceased, but her mother is living.  The patient's mother has a brother and sister who are cancer free.  Her parents are deceased from non-cancer related issues.  The patient's father was diagnosed with prostate cancer at 25 and died at 64.  He has one sister who died of diabetes and a heart attack.  He has multiple maternal half siblings who are not known to have cancer.  The paternal grandparents are deceased from a heart attack and lung problems.  Kristy Franklin is unaware of previous family history of genetic testing for hereditary cancer risks. Patient's maternal ancestors are of [place] descent, and paternal ancestors are of [place] descent. There is no reported Ashkenazi Jewish ancestry. There is no known consanguinity.  GENETIC COUNSELING ASSESSMENT: Kristy Franklin is a 47 y.o. female with a personal history of ovarian cancer and a family history of prostate cancer which is somewhat suggestive of a hereditary cancer syndrome and predisposition to cancer. We, therefore, discussed and recommended the following at today's visit.   DISCUSSION: We discussed that about 20% of ovarian cancer is hereditary with most cases due to BRCA mutations.  There are other genes that can increase the risk for ovarian cancer, including BRIP1, RAD51C, RAD51D and the Lynch syndrome genes.  We reviewed the characteristics, features and inheritance patterns of hereditary cancer syndromes. We also discussed genetic testing, including the appropriate family members to test, the process of  testing, insurance coverage and turn-around-time for results. We discussed the implications of a negative, positive and/or variant of uncertain significant result. We recommended Kristy Franklin pursue genetic testing for the [facility] gene panel. The [facility] gene panel offered by [employer] includes sequencing and deletion/duplication testing of the following 35 genes: APC, ATM, AXIN2, BARD1, BMPR1A, BRCA1, BRCA2, BRIP1, CHD1, CDK4, CDKN2A, CHEK2, EPCAM (large rearrangement only), HOXB13, (sequencing only), GALNT12, MLH1, MSH2, MSH3 (excluding repetitive portions of exon 1), MSH6, MUTYH, NBN, NTHL1, PALB2, PMS2, PTEN, RAD51C, RAD51D, RNF43, RPS20, SMAD4, STK11, and TP53. Sequencing was performed  for select regions of POLE and POLD1, and large rearrangement analysis was performed for select regions of GREM1.  We discussed that genetic testing through [facility] will test for hereditary mutations that could explain her diagnosis of cancer.  However, homologous recombination testing (HRD) is genetic testing performed on her tumor that can determine genetic changes that could influence her management.  HRD testing is performed in tandem with genetic testing, and typically at no additional cost.    The patient does not carry insurance.  We completed the [employer] application for uninsured patients.  She will fax a copy of her 2018 tax return to me and I will send it to [employer].  Based on Kristy Franklin's personal and family history of cancer, she meets medical criteria for genetic testing. Despite that she meets criteria, she may still have an out of pocket cost. We discussed that if her out of pocket cost for testing is over $100, the laboratory will call and confirm whether she wants to proceed with testing.  If the out of pocket cost of testing is less than $100 she will be billed by the genetic testing laboratory.   PLAN: After considering the risks, benefits, and limitations, Kristy Franklin  provided  informed consent to pursue genetic testing and the blood sample was sent to [facility] for analysis of the [facility] and HRD testing. Results should be available within approximately 2-3 weeks' time after the tax returns are submitted, at which point they will be disclosed by telephone to Kristy Franklin, as will any additional recommendations warranted by these results. Kristy Franklin will receive a summary of her genetic counseling visit and a copy of her results once available. This information will also be available in Epic. We encouraged Kristy Franklin to remain in contact with cancer genetics annually so that we can continuously update the family history and inform her of any changes in cancer genetics and testing that may be of benefit for her family. Kristy Franklin questions were answered to her satisfaction today. Our contact information was provided should additional questions or concerns arise.  Lastly, we encouraged Kristy Franklin to remain in contact with cancer genetics annually so that we can continuously update the family history and inform her of any changes in cancer genetics and testing that may be of benefit for this family.   Ms.  Franklin questions were answered to her satisfaction today. Our contact information was provided should additional questions or concerns arise. Thank you for the referral and allowing [place] to share in the care of your patient.   Karen P. Florene Glen, [place], [facility] Certified Genetic Counselor [email or web address] phone: [phone]  The patient was seen for a total of 60 minutes in face-to-face genetic counseling.  This patient was discussed with Drs. Magrinat, Lindi Adie and/or Burr Medico who agrees with the above.    _______________________________________________________________________ For Office Staff:  Number of people involved in session: 4 Was an Intern/ student involved with case: no

## 2017-10-10 NOTE — Telephone Encounter (Signed)
I called patient to let her know that per Joylene John that her urinalysis doesn't show any infection at this time. Patient states " I am now  having problems urinating, I have to sit and wait for the urine to come, and when it does come it burns".  "I am also having chills, and very bad back pain.  I told the patient that we would like to have Dr. Gerarda Fraction see her tomorrow October 11, 2017 at 12:30pm.  Patient verbalized understanding.

## 2017-10-11 ENCOUNTER — Encounter: Payer: Self-pay | Admitting: Obstetrics

## 2017-10-11 ENCOUNTER — Inpatient Hospital Stay (HOSPITAL_BASED_OUTPATIENT_CLINIC_OR_DEPARTMENT_OTHER): Payer: Self-pay | Admitting: Obstetrics

## 2017-10-11 VITALS — BP 134/61 | HR 75 | Temp 98.0°F | Resp 20 | Wt 186.6 lb

## 2017-10-11 DIAGNOSIS — C562 Malignant neoplasm of left ovary: Secondary | ICD-10-CM

## 2017-10-11 LAB — URINE CULTURE

## 2017-10-11 NOTE — Progress Notes (Signed)
S: Asked to see patient as Dr. Denman George out of office. Postop patient having midline back pain and urinary difficulty. States initially postop she was having urgency and the urine voided immediately, but now she still feels urgency but not as much urine is coming out. Has chills, but no fever. No N/V. No drainage per vagina, no bleeding  O: Vitals:   10/11/17 1301  BP: 134/61  Pulse: 75  Resp: 20  Temp: 98 F (36.7 C)  SpO2: 100%   General NAD, normal respiratory effort Abdo - soft, nt nd. Wounds cdi. Ext no edema nt  A: Postop R/o urinary retention  P: We have a urine culture pending, although the UA looks clean. We'll cath her today to document no retention. Increase fluid intake. If not feeling better by Monday we'll get a CTScan. Otherwise keep routine followup with Dr. Denman George as planned

## 2017-10-11 NOTE — Patient Instructions (Addendum)
1. Drink more fluids 2. If things are not improving by Monday let us know and we will order a scan 3. Keep an eye on your temperature and if it goes above 100 please call us. 4. If any leakage of fluid from the vagina call [phone]

## 2017-10-16 ENCOUNTER — Encounter: Payer: Self-pay | Admitting: Hematology and Oncology

## 2017-10-16 ENCOUNTER — Inpatient Hospital Stay (HOSPITAL_BASED_OUTPATIENT_CLINIC_OR_DEPARTMENT_OTHER): Payer: Self-pay | Admitting: Hematology and Oncology

## 2017-10-16 ENCOUNTER — Telehealth: Payer: Self-pay | Admitting: Hematology and Oncology

## 2017-10-16 ENCOUNTER — Telehealth: Payer: Self-pay | Admitting: Oncology

## 2017-10-16 VITALS — BP 114/47 | HR 70 | Temp 98.1°F | Resp 18 | Ht 63.0 in | Wt 187.8 lb

## 2017-10-16 DIAGNOSIS — Z8042 Family history of malignant neoplasm of prostate: Secondary | ICD-10-CM

## 2017-10-16 DIAGNOSIS — C562 Malignant neoplasm of left ovary: Secondary | ICD-10-CM

## 2017-10-16 NOTE — Telephone Encounter (Signed)
Left a message for Lenise, Patient Financial Advocate to see if she can meet with patient today.

## 2017-10-16 NOTE — Telephone Encounter (Signed)
Gave avs and calendar ° °

## 2017-10-16 NOTE — Progress Notes (Signed)
START ON PATHWAY REGIMEN - Ovarian     A cycle is every 21 days:     Paclitaxel      Carboplatin   **Always confirm dose/schedule in your pharmacy ordering system**  Patient Characteristics: Postoperative without Neoadjuvant Therapy (Pathologic Staging), Newly Diagnosed, Adjuvant Therapy, Stage IIB and Stage IIIA/B/C Optimal Cytoreduction Therapeutic Status: Postoperative without Neoadjuvant Therapy (Pathologic Staging) BRCA Mutation Status: Awaiting Test Results AJCC 8 Stage Grouping: IIIA1 AJCC M Category: cM0 AJCC T Category: pT1c AJCC N Category: pN1 Intent of Therapy: Curative Intent, Discussed with Patient

## 2017-10-17 ENCOUNTER — Encounter: Payer: Self-pay | Admitting: Hematology and Oncology

## 2017-10-17 NOTE — Assessment & Plan Note (Signed)
We discussed the role of adjuvant chemotherapy I recommend port placement, chemo education class and repeat blood work next week with plan to start her on 6 cycles of adjuvant chemotherapy of carboplatin and Taxol She has genetic counseling referral, results pending We discussed briefly expected side effects of treatment I will see her back next week for further discussion about plan of care

## 2017-10-17 NOTE — Progress Notes (Signed)
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 IR IMAGING GUIDED PORT INSERTION    Standing Status:   Future    Standing Expiration Date:   12/17/2018    Order Specific Question:   Reason for Exam (SYMPTOM  OR DIAGNOSIS REQUIRED)    Answer:   need port for chemo to start 8/30    Order Specific Question:   Is the patient pregnant?    Answer:   No    Order Specific Question:   Preferred Imaging Location?    Answer:   [facility]  . CBC with Differential/Platelet    Standing Status:   Standing    Number of Occurrences:   22    Standing Expiration Date:   10/18/2018  . Comprehensive metabolic panel    Standing Status:   Standing    Number of Occurrences:   22    Standing Expiration Date:   10/18/2018  . CA 125    Standing Status:   Standing    Number of Occurrences:   9    Standing Expiration Date:   10/18/2018     CHIEF COMPLAINTS/PURPOSE OF CONSULTATION:  Newly diagnosed ovarian cancer, for adjuvant treatment  HISTORY OF PRESENTING ILLNESS:  Kristy Franklin 47 y.o. female is here because of recent diagnosis of ovarian cancer. She is here accompanied by her daughter The patient was otherwise healthy until she presented with signs and symptoms of abdominal pain. I have reviewed her chart and materials related to her cancer extensively and collaborated history with the patient. 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  During the ER visit an ultrasound performed which identified an adnexal mass on the left, with no signs of torsion.  She was then seen in the offices of Dr. Barrie Dunker in June 2019 and a plan was made to proceed with surgical removal of the adnexa    08/23/2017 Surgery    On August 23, 2017 she was taken to the operating room for a laparoscopic left salpingo-oophorectomy.  Review of the operative note suggest that intraoperative findings were significant for a 6 cm left adnexal mass that was stuck in the cul-de-sac with some paraovarian adhesions noted.  It did not appear to be malignant in appearance to the surgeons at the time of surgery.  The upper abdomen, diaphragms, and omentum were commented on as looking normal.  There was no ascites.  The left tube and ovary was removed, however there was some fragmentation of the specimen during removal, and therefore the left tube and ovary were sent as 2 specimens.  The first specimen is labeled ovary biopsy for frozen section  which revealed serous carcinoma and the second specimen is labeled left ovary on permanent this revealed serous carcinoma.  The operative note reports that the intraoperative diagnosis was borderline tumor, or reviewing the pathology report it states that the intraoperative diagnosis was left ovarian biopsy epithelial neoplasm at least borderline, cannot exclude carcinoma.  The frozen section of the second specimen was the same (epithelial neoplasm at least borderline cannot exclude carcinoma.).  The patient was seen back in the office by Dr. Barrie Dunker on September 06, 2017 and delivered her diagnosis of carcinoma.  At that time she was referred for consultation with  gynecologic oncology.    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 No evidence of thoracic metastasis. 2. Small angular nodule in the RIGHT middle lobe is favored benign.  Abdomen / Pelvis Impression:  1. No evidence of local recurrence of ovarian carcinoma. 2. No evidence of metastatic disease in the abdomen pelvis. 3. No free fluid    09/25/2017 Tumor Marker    Patient's tumor was tested for the following markers: CA-125 Results of the tumor marker test revealed 66.4    10/02/2017 Pathology Results    1. Omentum, resection other than for tumor - NO CARCINOMA IDENTIFIED 2. Uterus +/- tubes/ovaries, neoplastic, right fallopian tube and ovary - CARCINOMA INVOLVING SEROSA OF THE UTERUS AND ENDOMETRIUM - NO EVIDENCE OF CARCINOMA IN THE CERVIX, RIGHT OVARY OR FALLOPIAN TUBE - LEIOMYOMATA (2 CM; LARGEST) - SEE COMMENT 3. Lymph nodes, regional resection, right pelvic - NO CARCINOMA IDENTIFIED IN NINE LYMPH NODES (0/9) 4. Lymph nodes, regional resection, left pelvic - METASTATIC CARCINOMA INVOLVING ONE OF FIVE NODES - SEE COMMENT Microscopic Comment 2. The left lateral aspect of the uterine serosa has carcinoma which appears endometrioid with foci of squamous differentiation; serous features are not seen. There are two focal areas of endometrioid carcinoma confined to the endometrium. Based on the size of the left ovarian lesion, left pelvic node involvement and minimal involvement of the endometrium, this is favored to be a primary ovarian with uterine involvement. However, given the different histologic subtyping of these lesions, it would be beneficial to review the current case in conjunction with the patient's recently diagnosed ovarian serous carcinoma for accurate staging. This case was discussed with Dr. Denman George on October 05, 2017. Dr. Lyndon Code reviewed the case and agrees with the above diagnosis. 4. Cytokeratin AE1/3 was  utilized and highlights isolated tumor cells and a focus of tumor with similar histology as that seen on the serosal surface of the endometrium.    10/02/2017 Pathology Results    PERITONEAL WASHING(SPECIMEN 1 OF 1 COLLECTED 10/02/17): ATYPICAL CELLS. SEE COMMENT.    10/02/2017 Surgery    Surgeon: Donaciano Eva   Operation: Robotic-assisted laparoscopic total hysterectomy with bilateral salpingoophorectomy, omentectomy, bilateral pelvic lymphadenectomy  Surgeon: Donaciano Eva  Operative Findings:  : 6cm grossly normal uterus (with pedunculated 1cm fibroid), normal appearing right tube and ovary. There was a thin layer of apparent residual left ovary adherent to the uterus/left ovarian fossa (completely resected with this surgery). No ascites, normal appendix and small bowel. Normal appearing omentum. No suspicious nodes.  No residual visible cancer remaining at the completion of the surgery.          Specimens: washings, omentum, uterus with cervix and right tube and ovary, right and left pelvic lymph nodes.     10/16/2017 Cancer Staging  Staging form: Ovary, Fallopian Tube, and Primary Peritoneal Carcinoma, AJCC 8th Edition - Pathologic: FIGO Stage III (pT3, pN1, cM0) - Signed by Heath Lark, MD on 10/17/2017    She denies history of abnormal Pap smear. She is healing well from recent surgery.  MEDICAL HISTORY:  Past Medical History:  Diagnosis Date  . Abdominal pain    Right mid only with bowel movements  . Blood in urine   . Change in bowel movement    more green color  . Family history of prostate cancer   . GERD (gastroesophageal reflux disease)   . Headache    after menstrual cycle every month  . History of gallstones   . Hypertension   . Neck pain on right side    pulsating  . Numbness and tingling of right leg   . Obesity   . Ovarian cancer, left ([place])   . PPD positive   . Skin rash    Chest, forehead, left arm    SURGICAL HISTORY: Past Surgical  History:  Procedure Laterality Date  . CESAREAN SECTION     x2  . CHOLECYSTECTOMY     20 years ago  . OMENTECTOMY N/A 10/02/2017   Procedure: OMENTECTOMY;  Surgeon: Everitt Amber, MD;  Location: [facility];  Service: Gynecology;  Laterality: N/A;  . OTHER SURGICAL HISTORY  07/2017   Left ovary removed  . ROBOTIC ASSISTED TOTAL HYSTERECTOMY WITH BILATERAL SALPINGO OOPHERECTOMY Right 10/02/2017   Procedure: XI ROBOTIC ASSISTED TOTAL HYSTERECTOMY WITH RIGHT SALPINGO OOPHORECTOMY;  Surgeon: Everitt Amber, MD;  Location: [facility];  Service: Gynecology;  Laterality: Right;  . ROBOTIC PELVIC AND PARA-AORTIC LYMPH NODE DISSECTION Bilateral 10/02/2017   Procedure: XI ROBOTIC PELVIC LYMPH NODE DISSECTION;  Surgeon: Everitt Amber, MD;  Location: [facility];  Service: Gynecology;  Laterality: Bilateral;    SOCIAL HISTORY: Social History   Socioeconomic History  . Marital status: Married    Spouse name: Media planner  . Number of children: 2  . Years of education: Not on file  . Highest education level: Not on file  Occupational History  . Occupation: unemployed  Social Needs  . Financial resource strain: Not on file  . Food insecurity:    Worry: Not on file    Inability: Not on file  . Transportation needs:    Medical: Not on file    Non-medical: Not on file  Tobacco Use  . Smoking status: Never Smoker  . Smokeless tobacco: Never Used  Substance and Sexual Activity  . Alcohol use: Never    Frequency: Never  . Drug use: Never  . Sexual activity: Not Currently    Comment: calendar  Lifestyle  . Physical activity:    Days per week: Not on file    Minutes per session: Not on file  . Stress: Not on file  Relationships  . Social connections:    Talks on phone: Not on file    Gets together: Not on file    Attends religious service: Not on file    Active member of club or organization: Not on file    Attends meetings of clubs or organizations: Not on file    Relationship status: Not on file  . Intimate  partner violence:    Fear of current or ex partner: Not on file    Emotionally abused: Not on file    Physically abused: Not on file    Forced sexual activity: Not on file  Other Topics Concern  . Not on  file  Social History Narrative  . Not on file    FAMILY HISTORY: Family History  Problem Relation Age of Onset  . Hypertension Mother   . Prostate cancer Father 46       d. 20  . Diabetes Paternal Aunt   . Heart attack Paternal Aunt   . Heart attack Paternal Grandmother 95  . Thyroid disease Other 14    ALLERGIES:  is allergic to other.  MEDICATIONS:  Current Outpatient Medications  Medication Sig Dispense Refill  . acetaminophen (TYLENOL) 500 MG tablet Take 2 tablets (1,000 mg total) by mouth every 6 (six) hours. 30 tablet 0  . estrogens, conjugated, (PREMARIN) 0.625 MG tablet Take 1 tablet (0.625 mg total) by mouth daily. Take daily for 21 days then do not take for 7 days. 30 tablet 11  . ibuprofen (ADVIL,MOTRIN) 200 MG tablet Take 200 mg by mouth daily as needed for headache or moderate pain.    Marland Kitchen lisinopril (PRINIVIL,ZESTRIL) 10 MG tablet Take 10 mg by mouth daily.   1  . mometasone (ELOCON) 0.1 % cream Apply 1 application topically 2 (two) times daily as needed for rash.  12  . oxyCODONE (OXY IR/ROXICODONE) 5 MG immediate release tablet Take 1 tablet (5 mg total) by mouth every 4 (four) hours as needed for severe pain or breakthrough pain. 30 tablet 0  . senna-docusate (SENOKOT-S) 8.6-50 MG tablet Take 2 tablets by mouth at bedtime. 30 tablet 0   No current facility-administered medications for this visit.     REVIEW OF SYSTEMS:   Constitutional: Denies fevers, chills or abnormal night sweats Eyes: Denies blurriness of vision, double vision or watery eyes Ears, nose, mouth, throat, and face: Denies mucositis or sore throat Respiratory: Denies cough, dyspnea or wheezes Cardiovascular: Denies palpitation, chest discomfort or lower extremity swelling Gastrointestinal:   Denies nausea, heartburn or change in bowel habits Skin: Denies abnormal skin rashes Lymphatics: Denies new lymphadenopathy or easy bruising Neurological:Denies numbness, tingling or new weaknesses Behavioral/Psych: Mood is stable, no new changes  All other systems were reviewed with the patient and are negative.  PHYSICAL EXAMINATION: ECOG PERFORMANCE STATUS: 1 - Symptomatic but completely ambulatory  Vitals:   10/16/17 1433  BP: (!) 114/47  Pulse: 70  Resp: 18  Temp: 98.1 F (36.7 C)  SpO2: 100%   Filed Weights   10/16/17 1433  Weight: 187 lb 12.8 oz (85.2 kg)    GENERAL:alert, no distress and comfortable SKIN: skin color, texture, turgor are normal, no rashes or significant lesions EYES: normal, conjunctiva are pink and non-injected, sclera clear OROPHARYNX:no exudate, no erythema and lips, buccal mucosa, and tongue normal  NECK: supple, thyroid normal size, non-tender, without nodularity LYMPH:  no palpable lymphadenopathy in the cervical, axillary or inguinal LUNGS: clear to auscultation and percussion with normal breathing effort HEART: regular rate & rhythm and no murmurs and no lower extremity edema ABDOMEN:abdomen soft, non-tender and normal bowel sounds.  She has well-healed surgical scars Musculoskeletal:no cyanosis of digits and no clubbing  PSYCH: alert & oriented x 3 with fluent speech NEURO: no focal motor/sensory deficits  LABORATORY DATA:  I have reviewed the data as listed Lab Results  Component Value Date   WBC 15.5 (H) 10/03/2017   HGB 12.1 10/03/2017   HCT 37.0 10/03/2017   MCV 82.0 10/03/2017   PLT 428 (H) 10/03/2017   Recent Labs    09/18/17 1234 09/25/17 1202 10/03/17 0510  NA 139 141 138  K 4.0 4.1 4.2  CL 107 107 105  CO2 23 26 25   GLUCOSE 81 82 154*  BUN 10 11 10   CREATININE 0.67 0.61 0.66  CALCIUM 9.7 9.8 9.6  GFRNONAA >60 >60 >60  GFRAA >60 >60 >60  PROT  --  8.1  --   ALBUMIN  --  4.5  --   AST  --  41  --   ALT  --  47*   --   ALKPHOS  --  73  --   BILITOT  --  0.7  --     RADIOGRAPHIC STUDIES:  I have personally reviewed the radiological images as listed and agreed with the findings in the report. Ct Chest W Contrast  Result Date: 09/24/2017 CLINICAL DATA:  LEFT ovarian carcinoma diagnosed 2010. History of LEFT oophorectomy. EXAM: CT CHEST, ABDOMEN, AND PELVIS WITH CONTRAST TECHNIQUE: Multidetector CT imaging of the chest, abdomen and pelvis was performed following the standard protocol during bolus administration of intravenous contrast. CONTRAST:  113mL ISOVUE-300 IOPAMIDOL (ISOVUE-300) INJECTION 61% COMPARISON:  None available FINDINGS: CT CHEST FINDINGS Cardiovascular: No significant vascular findings. Normal heart size. No pericardial effusion. Mediastinum/Nodes: No axillary supraclavicular adenopathy. No mediastinal hilar adenopathy. Esophagus normal. Lungs/Pleura: Normal small angular nodule in the RIGHT middle lobe measuring 4 mm (image 78/4) has benign morphology. Musculoskeletal: No aggressive osseous lesion. CT ABDOMEN AND PELVIS FINDINGS Hepatobiliary: No focal hepatic lesion. Postcholecystectomy. No biliary dilatation. Pancreas: Pancreas is normal. No ductal dilatation. No pancreatic inflammation. Spleen: Normal spleen Adrenals/urinary tract: Adrenal glands and kidneys are normal. The ureters and bladder normal. Stomach/Bowel: Stomach, small bowel, appendix, and cecum are normal. Several diverticula sigmoid colon without acute inflammation. Rectum normal. Vascular/Lymphatic: Abdominal aorta is normal caliber. There is no retroperitoneal or periportal lymphadenopathy. No pelvic lymphadenopathy. Reproductive: Uterus normal. Rounded exophytic lesion from the LEFT uterus fundus likely represents exophytic leiomyoma. RIGHT ovary normal. LEFT ovary surgically absent. Other: No peritoneal or omental metastasis.  No free-fluid. Musculoskeletal: No aggressive osseous lesion. IMPRESSION: Chest Impression: 1. No evidence  of thoracic metastasis. 2. Small angular nodule in the RIGHT middle lobe is favored benign. Abdomen / Pelvis Impression: 1. No evidence of local recurrence of ovarian carcinoma. 2. No evidence of metastatic disease in the abdomen pelvis. 3. No free fluid. Electronically Signed   By: Suzy Bouchard M.D.   On: 09/24/2017 17:43   Ct Abdomen Pelvis W Contrast  Result Date: 09/24/2017 CLINICAL DATA:  LEFT ovarian carcinoma diagnosed 2010. History of LEFT oophorectomy. EXAM: CT CHEST, ABDOMEN, AND PELVIS WITH CONTRAST TECHNIQUE: Multidetector CT imaging of the chest, abdomen and pelvis was performed following the standard protocol during bolus administration of intravenous contrast. CONTRAST:  142mL ISOVUE-300 IOPAMIDOL (ISOVUE-300) INJECTION 61% COMPARISON:  None available FINDINGS: CT CHEST FINDINGS Cardiovascular: No significant vascular findings. Normal heart size. No pericardial effusion. Mediastinum/Nodes: No axillary supraclavicular adenopathy. No mediastinal hilar adenopathy. Esophagus normal. Lungs/Pleura: Normal small angular nodule in the RIGHT middle lobe measuring 4 mm (image 78/4) has benign morphology. Musculoskeletal: No aggressive osseous lesion. CT ABDOMEN AND PELVIS FINDINGS Hepatobiliary: No focal hepatic lesion. Postcholecystectomy. No biliary dilatation. Pancreas: Pancreas is normal. No ductal dilatation. No pancreatic inflammation. Spleen: Normal spleen Adrenals/urinary tract: Adrenal glands and kidneys are normal. The ureters and bladder normal. Stomach/Bowel: Stomach, small bowel, appendix, and cecum are normal. Several diverticula sigmoid colon without acute inflammation. Rectum normal. Vascular/Lymphatic: Abdominal aorta is normal caliber. There is no retroperitoneal or periportal lymphadenopathy. No pelvic lymphadenopathy. Reproductive: Uterus normal. Rounded exophytic lesion from the LEFT uterus fundus  likely represents exophytic leiomyoma. RIGHT ovary normal. LEFT ovary surgically  absent. Other: No peritoneal or omental metastasis.  No free-fluid. Musculoskeletal: No aggressive osseous lesion. IMPRESSION: Chest Impression: 1. No evidence of thoracic metastasis. 2. Small angular nodule in the RIGHT middle lobe is favored benign. Abdomen / Pelvis Impression: 1. No evidence of local recurrence of ovarian carcinoma. 2. No evidence of metastatic disease in the abdomen pelvis. 3. No free fluid. Electronically Signed   By: Suzy Bouchard M.D.   On: 09/24/2017 17:43    I spent 40 minutes counseling the patient face to face. The total time spent in the appointment was 60 minutes and more than 50% was on counseling.  All questions were answered. The patient knows to call the clinic with any problems, questions or concerns.  Heath Lark, MD 10/17/2017 9:42 AM

## 2017-10-22 ENCOUNTER — Encounter: Payer: Self-pay | Admitting: Oncology

## 2017-10-22 ENCOUNTER — Telehealth: Payer: Self-pay | Admitting: Oncology

## 2017-10-22 NOTE — Telephone Encounter (Signed)
Called patient back and asked if she has started any new medication or tried any new foods.  She denied both.  She also denies having a fever but said she has had chills which she has had since before surgery.  Asked if she needs to see a doctor and she said no that she can tolerate it.  She just wanted to let us know.  Advised her to call back if it gets worse.

## 2017-10-22 NOTE — Progress Notes (Signed)
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 Disposition is to adjuvant chemotherapy with 6 cycles of carboplatin and paclitaxel.   This Multidisciplinary conference took place involving physicians from [place], [place], Radiation Oncology, Pathology, Radiology along with the Gynecologic Oncology Nurse Practitioner and RN.  Comprehensive assessment of the patient's malignancy, staging, need for surgery, chemotherapy, radiation therapy, and need for further testing were reviewed. Supportive measures, both inpatient and following discharge were also discussed. The recommended plan of care is documented. Greater than 35 minutes were spent correlating and coordinating this patient's care.

## 2017-10-22 NOTE — Telephone Encounter (Signed)
Netha called and said she is having burning in her legs, arms (left greater than right), top of her stomach and tongue on and off since Saturday.  She states that the burning is worse when she is sitting or lying down.  She said that she is scared and is wondering if this is normal.  She has not started chemotherapy yet.  She said the burning is inside and denies have a rash.  She also denies feeling light headed and is able to walk.  She denies having any urinary issues other than frequency.

## 2017-10-23 ENCOUNTER — Other Ambulatory Visit: Payer: Self-pay | Admitting: Radiology

## 2017-10-24 ENCOUNTER — Encounter (HOSPITAL_COMMUNITY): Payer: Self-pay

## 2017-10-24 ENCOUNTER — Ambulatory Visit (HOSPITAL_COMMUNITY)
Admission: RE | Admit: 2017-10-24 | Discharge: 2017-10-24 | Disposition: A | Discharge status: Home / Self Care | Payer: Self-pay | Source: Ambulatory Visit | Attending: Hematology and Oncology | Admitting: Hematology and Oncology

## 2017-10-24 ENCOUNTER — Ambulatory Visit (HOSPITAL_COMMUNITY)
Admission: RE | Admit: 2017-10-24 | Discharge: 2017-10-24 | Disposition: A | Discharge status: Home / Self Care | Payer: Self-pay | Source: Ambulatory Visit | Attending: Interventional Radiology | Admitting: Interventional Radiology

## 2017-10-24 DIAGNOSIS — Z79899 Other long term (current) drug therapy: Secondary | ICD-10-CM | POA: Insufficient documentation

## 2017-10-24 DIAGNOSIS — E669 Obesity, unspecified: Secondary | ICD-10-CM | POA: Insufficient documentation

## 2017-10-24 DIAGNOSIS — Z8249 Family history of ischemic heart disease and other diseases of the circulatory system: Secondary | ICD-10-CM | POA: Insufficient documentation

## 2017-10-24 DIAGNOSIS — Z6835 Body mass index (BMI) 35.0-35.9, adult: Secondary | ICD-10-CM | POA: Insufficient documentation

## 2017-10-24 DIAGNOSIS — C562 Malignant neoplasm of left ovary: Secondary | ICD-10-CM | POA: Insufficient documentation

## 2017-10-24 DIAGNOSIS — Z9889 Other specified postprocedural states: Secondary | ICD-10-CM | POA: Insufficient documentation

## 2017-10-24 DIAGNOSIS — K219 Gastro-esophageal reflux disease without esophagitis: Secondary | ICD-10-CM | POA: Insufficient documentation

## 2017-10-24 DIAGNOSIS — Z90721 Acquired absence of ovaries, unilateral: Secondary | ICD-10-CM | POA: Insufficient documentation

## 2017-10-24 DIAGNOSIS — Z9049 Acquired absence of other specified parts of digestive tract: Secondary | ICD-10-CM | POA: Insufficient documentation

## 2017-10-24 DIAGNOSIS — Z8042 Family history of malignant neoplasm of prostate: Secondary | ICD-10-CM | POA: Insufficient documentation

## 2017-10-24 DIAGNOSIS — I10 Essential (primary) hypertension: Secondary | ICD-10-CM | POA: Insufficient documentation

## 2017-10-24 DIAGNOSIS — Z90710 Acquired absence of both cervix and uterus: Secondary | ICD-10-CM | POA: Insufficient documentation

## 2017-10-24 DIAGNOSIS — Z9109 Other allergy status, other than to drugs and biological substances: Secondary | ICD-10-CM | POA: Insufficient documentation

## 2017-10-24 HISTORY — PX: IR IMAGING GUIDED PORT INSERTION: IMG5740

## 2017-10-24 LAB — CBC WITH DIFFERENTIAL/PLATELET
BASOS ABS: 0.1 10*3/uL (ref 0.0–0.1)
BASOS PCT: 1 %
Eosinophils Absolute: 0.8 10*3/uL — ABNORMAL HIGH (ref 0.0–0.7)
Eosinophils Relative: 7 %
HEMATOCRIT: 38.6 % (ref 36.0–46.0)
Hemoglobin: 12.6 g/dL (ref 12.0–15.0)
LYMPHS PCT: 24 %
Lymphs Abs: 2.6 10*3/uL (ref 0.7–4.0)
MCH: 27 pg (ref 26.0–34.0)
MCHC: 32.6 g/dL (ref 30.0–36.0)
MCV: 82.8 fL (ref 78.0–100.0)
Monocytes Absolute: 0.6 10*3/uL (ref 0.1–1.0)
Monocytes Relative: 5 %
NEUTROS ABS: 6.8 10*3/uL (ref 1.7–7.7)
NEUTROS PCT: 63 %
Platelets: 481 10*3/uL — ABNORMAL HIGH (ref 150–400)
RBC: 4.66 MIL/uL (ref 3.87–5.11)
RDW: 14.3 % (ref 11.5–15.5)
WBC: 10.8 10*3/uL — AB (ref 4.0–10.5)

## 2017-10-24 LAB — PROTIME-INR
INR: 1.03
Prothrombin Time: 13.4 seconds (ref 11.4–15.2)

## 2017-10-24 MED ORDER — CEFAZOLIN SODIUM-DEXTROSE 2-4 GM/100ML-% IV SOLN
INTRAVENOUS | Status: AC
  Administered 2017-10-24: 2 g via INTRAVENOUS
  Filled 2017-10-24: qty 100

## 2017-10-24 MED ORDER — SODIUM CHLORIDE 0.9 % IV SOLN
INTRAVENOUS | Status: DC
  Administered 2017-10-24: 14:00:00 via INTRAVENOUS

## 2017-10-24 MED ORDER — FENTANYL CITRATE (PF) 100 MCG/2ML IJ SOLN
INTRAMUSCULAR | Status: AC | PRN
  Administered 2017-10-24 (×2): 50 ug via INTRAVENOUS

## 2017-10-24 MED ORDER — MIDAZOLAM HCL 2 MG/2ML IJ SOLN
INTRAMUSCULAR | Status: AC | PRN
  Administered 2017-10-24 (×2): 1 mg via INTRAVENOUS

## 2017-10-24 MED ORDER — CEFAZOLIN SODIUM-DEXTROSE 2-4 GM/100ML-% IV SOLN
2.0000 g | INTRAVENOUS | Status: AC
  Administered 2017-10-24: 2 g via INTRAVENOUS

## 2017-10-24 MED ORDER — LIDOCAINE HCL (PF) 1 % IJ SOLN
INTRAMUSCULAR | Status: AC | PRN
  Administered 2017-10-24: 20 mL

## 2017-10-24 MED ORDER — FENTANYL CITRATE (PF) 100 MCG/2ML IJ SOLN
INTRAMUSCULAR | Status: AC
  Filled 2017-10-24: qty 2

## 2017-10-24 MED ORDER — HEPARIN SOD (PORK) LOCK FLUSH 100 UNIT/ML IV SOLN
INTRAVENOUS | Status: AC
  Filled 2017-10-24: qty 5

## 2017-10-24 MED ORDER — MIDAZOLAM HCL 2 MG/2ML IJ SOLN
INTRAMUSCULAR | Status: AC
  Filled 2017-10-24: qty 4

## 2017-10-24 MED ORDER — LIDOCAINE-EPINEPHRINE (PF) 2 %-1:200000 IJ SOLN
INTRAMUSCULAR | Status: AC
  Filled 2017-10-24: qty 20

## 2017-10-24 NOTE — Discharge Instructions (Signed)
Insercin del dispositivo de perfusin implantable: cuidados posteriores (Implanted Port Insertion, Care After) Siga estas instrucciones durante las prximas semanas. Estas indicaciones le proporcionan informacin general acerca de cmo deber cuidarse despus del procedimiento. El mdico tambin podr darle instrucciones ms especficas. El tratamiento ha sido planificado segn las prcticas mdicas actuales, pero en algunos casos pueden ocurrir problemas. Comunquese con el mdico si tiene algn problema o tiene dudas despus del procedimiento. QU ESPERAR DESPUS DEL PROCEDIMIENTO Despus del procedimiento, es comn lo siguiente:  Molestias en el sitio de la insercin del dispositivo. Colocar compresas de hielo en la zona ayudar.  Hematomas en la piel alrededor del dispositivo, los cuales disminuirn luego de 3 o 4 das. INSTRUCCIONES PARA EL CUIDADO EN EL HOGAR  Luego que le coloquen el dispositivo, le darn una tarjeta de informacin del fabricante. La tarjeta contiene informacin acerca del dispositivo. Llvela siempre con usted.  Sepa qu tipo de dispositivo de perfusin implantable tiene. Hay diferentes tipos de dispositivos de perfusin implantables.  Use un brazalete de alerta mdico en caso de emergencia. Esto ayudar a Theatre stage manager a los mdicos que usted tiene un dispositivo de perfusin implantable.  El dispositivo puede permanecer implantado por el tiempo que el mdico considere necesario.  Posiblemente un enfermero vaya a su domicilio para administrarle los medicamentos y cuidar del dispositivo.  Usted o un familiar pueden recibir instrucciones y Engineer, building services para Architectural technologist los medicamentos y cuidar del dispositivo en Engineer, mining.  SOLICITE ATENCIN MDICA SI:  El dispositivo no funciona o no puede hacer que retorne Herbalist.  Siente escalofros o fiebre.  SOLICITE ATENCIN MDICA DE INMEDIATO SI:  Observa un lquido nuevo o pus en la incisin.  Advierte un olor  ftido que proviene del lugar de la incisin.  Observa hinchazn, ms eritemas o siente dolor en el lugar de la incisin o alrededor del dispositivo.  Siente falta de aire o Tourist information centre manager.  Esta informacin no tiene Marine scientist el consejo del mdico. Asegrese de hacerle al mdico cualquier pregunta que tenga. Document Released: 12/04/2012 Document Revised: 02/18/2013 Document Reviewed: 10/21/2012 Elsevier Interactive Patient Education  2017 [place] consciente moderada en los adultos, cuidados posteriores (Moderate Conscious Sedation, Adult, Care After) Estas indicaciones le proporcionan informacin acerca de cmo deber cuidarse despus del procedimiento. El mdico tambin podr darle instrucciones ms especficas. El tratamiento ha sido planificado segn las prcticas mdicas actuales, pero en algunos casos pueden ocurrir problemas. Comunquese con el mdico si tiene algn problema o dudas despus del procedimiento. QU ESPERAR DESPUS DEL PROCEDIMIENTO Despus del procedimiento, es comn:  Sentirse somnoliento durante varias horas.  Sentirse torpe y [employer] de equilibrio durante varias horas.  Perder el sentido de la realidad durante varias horas.  Vomitar si come [employer]. INSTRUCCIONES PARA EL CUIDADO EN EL HOGAR Durante al menos 24horas despus del procedimiento:  No haga lo siguiente: ? Participar en actividades que impliquen posibles cadas o lesiones. ? Conducir vehculos. ? Operar maquinarias pesadas. ? Beber alcohol. ? Tomar somnferos o medicamentos que causen somnolencia. ? Firmar documentos legales ni tomar [employer]. ? Cuidar a nios por su cuenta.  Hacer reposo. Comida y bebida  Siga la dieta recomendada por el mdico.  Si vomita: ? Pruebe agua, jugo o sopa cuando usted pueda beber sin vomitar. ? Asegrese de no tener nuseas antes de ingerir alimentos slidos. Instrucciones generales  Permanezca con  un adulto responsable hasta que est completamente despierto y consciente.  [employer]  medicamentos de venta libre y los recetados solamente como se lo haya indicado el mdico.  Si fuma, no lo haga sin supervisin.  Concurra a todas las visitas de control como se lo haya indicado el mdico. Esto es importante. SOLICITE ATENCIN MDICA SI:  Sigue teniendo nuseas o vomitando.  Tiene sensacin de desvanecimiento.  Le aparece una erupcin cutnea.  Tiene fiebre. SOLICITE ATENCIN MDICA DE INMEDIATO SI:  Tiene dificultad para respirar. Esta informacin no tiene Marine scientist el consejo del mdico. Asegrese de hacerle al mdico cualquier pregunta que tenga. Document Released: 02/18/2013 Document Revised: 03/06/2014 Document Reviewed: 06/05/2015 Elsevier Interactive Patient Education  [employer].

## 2017-10-24 NOTE — H&P (Signed)
Chief Complaint: Patient was seen in consultation today for Port-A-Cath placement  Referring Physician(s): [place]  Supervising Physician: Daryll Brod  Patient Status: [facility] - Out-pt  History of Present Illness: Kristy Franklin is a 47 y.o. female with history of recently diagnosed left ovarian carcinoma, status post surgery, who presents today for Port-A-Cath placement for chemotherapy.  Past Medical History:  Diagnosis Date  . Abdominal pain    Right mid only with bowel movements  . Blood in urine   . Change in bowel movement    more green color  . Family history of prostate cancer   . GERD (gastroesophageal reflux disease)   . Headache    after menstrual cycle every month  . History of gallstones   . Hypertension   . Neck pain on right side    pulsating  . Numbness and tingling of right leg   . Obesity   . Ovarian cancer, left ([place])   . PPD positive   . Skin rash    Chest, forehead, left arm    Past Surgical History:  Procedure Laterality Date  . CESAREAN SECTION     x2  . CHOLECYSTECTOMY     20 years ago  . OMENTECTOMY N/A 10/02/2017   Procedure: OMENTECTOMY;  Surgeon: Everitt Amber, MD;  Location: [facility];  Service: Gynecology;  Laterality: N/A;  . OTHER SURGICAL HISTORY  07/2017   Left ovary removed  . ROBOTIC ASSISTED TOTAL HYSTERECTOMY WITH BILATERAL SALPINGO OOPHERECTOMY Right 10/02/2017   Procedure: XI ROBOTIC ASSISTED TOTAL HYSTERECTOMY WITH RIGHT SALPINGO OOPHORECTOMY;  Surgeon: Everitt Amber, MD;  Location: [facility];  Service: Gynecology;  Laterality: Right;  . ROBOTIC PELVIC AND PARA-AORTIC LYMPH NODE DISSECTION Bilateral 10/02/2017   Procedure: XI ROBOTIC PELVIC LYMPH NODE DISSECTION;  Surgeon: Everitt Amber, MD;  Location: [facility];  Service: Gynecology;  Laterality: Bilateral;    Allergies: Other  Medications: Prior to Admission medications   Medication Sig Start Date End Date Taking? Authorizing Provider  acetaminophen (TYLENOL) 500 MG tablet  Take 2 tablets (1,000 mg total) by mouth every 6 (six) hours. 10/03/17   Everitt Amber, MD  estrogens, conjugated, (PREMARIN) 0.625 MG tablet Take 1 tablet (0.625 mg total) by mouth daily. Take daily for 21 days then do not take for 7 days. 10/03/17   Everitt Amber, MD  ibuprofen (ADVIL,MOTRIN) 200 MG tablet Take 200 mg by mouth daily as needed for headache or moderate pain.    [provider]  lisinopril (PRINIVIL,ZESTRIL) 10 MG tablet Take 10 mg by mouth daily.  09/10/17   [provider]  mometasone (ELOCON) 0.1 % cream Apply 1 application topically 2 (two) times daily as needed for rash. 08/10/17   [provider]  oxyCODONE (OXY IR/ROXICODONE) 5 MG immediate release tablet Take 1 tablet (5 mg total) by mouth every 4 (four) hours as needed for severe pain or breakthrough pain. 10/03/17   Everitt Amber, MD  senna-docusate (SENOKOT-S) 8.6-50 MG tablet Take 2 tablets by mouth at bedtime. 10/03/17   Everitt Amber, MD     Family History  Problem Relation Age of Onset  . Hypertension Mother   . Prostate cancer Father 39       d. 74  . Diabetes Paternal Aunt   . Heart attack Paternal Aunt   . Heart attack Paternal Grandmother 95  . Thyroid disease Other 14    Social History   Socioeconomic History  . Marital status: Married    Spouse name: Media planner  .  Number of children: 2  . Years of education: Not on file  . Highest education level: Not on file  Occupational History  . Occupation: unemployed  Social Needs  . Financial resource strain: Not on file  . Food insecurity:    Worry: Not on file    Inability: Not on file  . Transportation needs:    Medical: Not on file    Non-medical: Not on file  Tobacco Use  . Smoking status: Never Smoker  . Smokeless tobacco: Never Used  Substance and Sexual Activity  . Alcohol use: Never    Frequency: Never  . Drug use: Never  . Sexual activity: Not Currently    Comment: calendar  Lifestyle  . Physical activity:    Days per week:  Not on file    Minutes per session: Not on file  . Stress: Not on file  Relationships  . Social connections:    Talks on phone: Not on file    Gets together: Not on file    Attends religious service: Not on file    Active member of club or organization: Not on file    Attends meetings of clubs or organizations: Not on file    Relationship status: Not on file  Other Topics Concern  . Not on file  Social History Narrative  . Not on file      Review of Systems denies fever, chest pain, cough, nausea, vomiting or bleeding.  She does have occasional headaches, some dyspnea with exertion, abdominal/back discomfort as well as occasional epigastric "burning" sensation  Vital Signs: BP 141/80, heart rate 71, respirations 16, O2 sat 99% room air, temp 98.5 LMP 09/24/2017   Physical Exam awake, alert.  Chest clear to auscultation bilaterally.  Heart with regular rate and rhythm.  Abdomen obese, soft, positive bowel sounds, mild diffuse tenderness to palpation; surg sites clean and dry.  No lower extremity edema.  Imaging: Ct Chest W Contrast  Result Date: 09/24/2017 CLINICAL DATA:  LEFT ovarian carcinoma diagnosed 2010. History of LEFT oophorectomy. EXAM: CT CHEST, ABDOMEN, AND PELVIS WITH CONTRAST TECHNIQUE: Multidetector CT imaging of the chest, abdomen and pelvis was performed following the standard protocol during bolus administration of intravenous contrast. CONTRAST:  166mL ISOVUE-300 IOPAMIDOL (ISOVUE-300) INJECTION 61% COMPARISON:  None available FINDINGS: CT CHEST FINDINGS Cardiovascular: No significant vascular findings. Normal heart size. No pericardial effusion. Mediastinum/Nodes: No axillary supraclavicular adenopathy. No mediastinal hilar adenopathy. Esophagus normal. Lungs/Pleura: Normal small angular nodule in the RIGHT middle lobe measuring 4 mm (image 78/4) has benign morphology. Musculoskeletal: No aggressive osseous lesion. CT ABDOMEN AND PELVIS FINDINGS Hepatobiliary: No focal  hepatic lesion. Postcholecystectomy. No biliary dilatation. Pancreas: Pancreas is normal. No ductal dilatation. No pancreatic inflammation. Spleen: Normal spleen Adrenals/urinary tract: Adrenal glands and kidneys are normal. The ureters and bladder normal. Stomach/Bowel: Stomach, small bowel, appendix, and cecum are normal. Several diverticula sigmoid colon without acute inflammation. Rectum normal. Vascular/Lymphatic: Abdominal aorta is normal caliber. There is no retroperitoneal or periportal lymphadenopathy. No pelvic lymphadenopathy. Reproductive: Uterus normal. Rounded exophytic lesion from the LEFT uterus fundus likely represents exophytic leiomyoma. RIGHT ovary normal. LEFT ovary surgically absent. Other: No peritoneal or omental metastasis.  No free-fluid. Musculoskeletal: No aggressive osseous lesion. IMPRESSION: Chest Impression: 1. No evidence of thoracic metastasis. 2. Small angular nodule in the RIGHT middle lobe is favored benign. Abdomen / Pelvis Impression: 1. No evidence of local recurrence of ovarian carcinoma. 2. No evidence of metastatic disease in the abdomen pelvis. 3. No free  fluid. Electronically Signed   By: Suzy Bouchard M.D.   On: 09/24/2017 17:43   Ct Abdomen Pelvis W Contrast  Result Date: 09/24/2017 CLINICAL DATA:  LEFT ovarian carcinoma diagnosed 2010. History of LEFT oophorectomy. EXAM: CT CHEST, ABDOMEN, AND PELVIS WITH CONTRAST TECHNIQUE: Multidetector CT imaging of the chest, abdomen and pelvis was performed following the standard protocol during bolus administration of intravenous contrast. CONTRAST:  136mL ISOVUE-300 IOPAMIDOL (ISOVUE-300) INJECTION 61% COMPARISON:  None available FINDINGS: CT CHEST FINDINGS Cardiovascular: No significant vascular findings. Normal heart size. No pericardial effusion. Mediastinum/Nodes: No axillary supraclavicular adenopathy. No mediastinal hilar adenopathy. Esophagus normal. Lungs/Pleura: Normal small angular nodule in the RIGHT middle  lobe measuring 4 mm (image 78/4) has benign morphology. Musculoskeletal: No aggressive osseous lesion. CT ABDOMEN AND PELVIS FINDINGS Hepatobiliary: No focal hepatic lesion. Postcholecystectomy. No biliary dilatation. Pancreas: Pancreas is normal. No ductal dilatation. No pancreatic inflammation. Spleen: Normal spleen Adrenals/urinary tract: Adrenal glands and kidneys are normal. The ureters and bladder normal. Stomach/Bowel: Stomach, small bowel, appendix, and cecum are normal. Several diverticula sigmoid colon without acute inflammation. Rectum normal. Vascular/Lymphatic: Abdominal aorta is normal caliber. There is no retroperitoneal or periportal lymphadenopathy. No pelvic lymphadenopathy. Reproductive: Uterus normal. Rounded exophytic lesion from the LEFT uterus fundus likely represents exophytic leiomyoma. RIGHT ovary normal. LEFT ovary surgically absent. Other: No peritoneal or omental metastasis.  No free-fluid. Musculoskeletal: No aggressive osseous lesion. IMPRESSION: Chest Impression: 1. No evidence of thoracic metastasis. 2. Small angular nodule in the RIGHT middle lobe is favored benign. Abdomen / Pelvis Impression: 1. No evidence of local recurrence of ovarian carcinoma. 2. No evidence of metastatic disease in the abdomen pelvis. 3. No free fluid. Electronically Signed   By: Suzy Bouchard M.D.   On: 09/24/2017 17:43    Labs:  CBC: Recent Labs    09/25/17 1202 10/03/17 0510  WBC 8.0 15.5*  HGB 12.9 12.1  HCT 39.1 37.0  PLT 404* 428*    COAGS: No results for input(s): INR, APTT in the last 8760 hours.  BMP: Recent Labs    09/18/17 1234 09/25/17 1202 10/03/17 0510  NA 139 141 138  K 4.0 4.1 4.2  CL 107 107 105  CO2 23 26 25   GLUCOSE 81 82 154*  BUN 10 11 10   CALCIUM 9.7 9.8 9.6  CREATININE 0.67 0.61 0.66  GFRNONAA >60 >60 >60  GFRAA >60 >60 >60    LIVER FUNCTION TESTS: Recent Labs    09/25/17 1202  BILITOT 0.7  AST 41  ALT 47*  ALKPHOS 73  PROT 8.1  ALBUMIN  4.5    TUMOR MARKERS: No results for input(s): AFPTM, CEA, CA199, CHROMGRNA in the last 8760 hours.  Assessment and Plan: 47 y.o. female with history of recently diagnosed left ovarian carcinoma, status post surgery, who presents today for Port-A-Cath placement for chemotherapy.Risks and benefits of image guided port-a-catheter placement was discussed with the patient via interpreter including, but not limited to bleeding, infection, pneumothorax, or fibrin sheath development and need for additional procedures.  All of the patient's questions were answered, patient is agreeable to proceed. Consent signed and in chart.   LABS PENDING  Thank you for this interesting consult.  I greatly enjoyed meeting Kristy Franklin and look forward to participating in their care.  A copy of this report was sent to the requesting provider on this date.  Electronically Signed: D. Rowe Robert, PA-C 10/24/2017, 1:31 PM   I spent a total of  25 minutes   in  face to face in clinical consultation, greater than 50% of which was counseling/coordinating care for Port-A-Cath placement

## 2017-10-24 NOTE — Procedures (Signed)
Ovarian ca  S/p RT IJ POWER PORT  Tip svcra No comp Stable ebl min Ready for use Full report in pacs  

## 2017-10-25 ENCOUNTER — Encounter: Payer: Self-pay | Admitting: Oncology

## 2017-10-25 ENCOUNTER — Inpatient Hospital Stay (HOSPITAL_BASED_OUTPATIENT_CLINIC_OR_DEPARTMENT_OTHER): Payer: Self-pay | Admitting: Hematology and Oncology

## 2017-10-25 ENCOUNTER — Inpatient Hospital Stay: Payer: Self-pay

## 2017-10-25 ENCOUNTER — Encounter: Payer: Self-pay | Admitting: Hematology and Oncology

## 2017-10-25 VITALS — BP 136/45 | HR 72 | Temp 98.3°F | Resp 18 | Ht 63.0 in | Wt 183.4 lb

## 2017-10-25 DIAGNOSIS — C562 Malignant neoplasm of left ovary: Secondary | ICD-10-CM

## 2017-10-25 DIAGNOSIS — R748 Abnormal levels of other serum enzymes: Secondary | ICD-10-CM

## 2017-10-25 DIAGNOSIS — Z8042 Family history of malignant neoplasm of prostate: Secondary | ICD-10-CM

## 2017-10-25 LAB — CBC WITH DIFFERENTIAL/PLATELET
BASOS ABS: 0.1 10*3/uL (ref 0.0–0.1)
Basophils Relative: 1 %
EOS ABS: 0.5 10*3/uL (ref 0.0–0.5)
Eosinophils Relative: 6 %
HCT: 38.9 % (ref 34.8–46.6)
HEMOGLOBIN: 12.7 g/dL (ref 11.6–15.9)
LYMPHS ABS: 2 10*3/uL (ref 0.9–3.3)
LYMPHS PCT: 22 %
MCH: 27.1 pg (ref 25.1–34.0)
MCHC: 32.6 g/dL (ref 31.5–36.0)
MCV: 82.9 fL (ref 79.5–101.0)
Monocytes Absolute: 0.5 10*3/uL (ref 0.1–0.9)
Monocytes Relative: 6 %
NEUTROS PCT: 65 %
Neutro Abs: 5.9 10*3/uL (ref 1.5–6.5)
Platelets: 399 10*3/uL (ref 145–400)
RBC: 4.69 MIL/uL (ref 3.70–5.45)
RDW: 14.3 % (ref 11.2–14.5)
WBC: 9 10*3/uL (ref 3.9–10.3)

## 2017-10-25 LAB — COMPREHENSIVE METABOLIC PANEL
ALT: 84 U/L — ABNORMAL HIGH (ref 0–44)
AST: 60 U/L — ABNORMAL HIGH (ref 15–41)
Albumin: 4.1 g/dL (ref 3.5–5.0)
Alkaline Phosphatase: 72 U/L (ref 38–126)
Anion gap: 9 (ref 5–15)
BUN: 11 mg/dL (ref 6–20)
CO2: 26 mmol/L (ref 22–32)
Calcium: 10 mg/dL (ref 8.9–10.3)
Chloride: 105 mmol/L (ref 98–111)
Creatinine, Ser: 0.7 mg/dL (ref 0.44–1.00)
Glucose, Bld: 104 mg/dL — ABNORMAL HIGH (ref 70–99)
POTASSIUM: 4.3 mmol/L (ref 3.5–5.1)
Sodium: 140 mmol/L (ref 135–145)
Total Bilirubin: 0.3 mg/dL (ref 0.3–1.2)
Total Protein: 7.6 g/dL (ref 6.5–8.1)

## 2017-10-25 MED ORDER — DEXAMETHASONE 4 MG PO TABS: ORAL_TABLET | ORAL | 0 refills | Status: DC

## 2017-10-25 MED ORDER — ONDANSETRON HCL 8 MG PO TABS: 8.0000 mg | ORAL_TABLET | Freq: Three times a day (TID) | ORAL | 1 refills | Status: DC | PRN

## 2017-10-25 MED ORDER — PROCHLORPERAZINE MALEATE 10 MG PO TABS: 10.0000 mg | ORAL_TABLET | Freq: Four times a day (QID) | ORAL | 1 refills | Status: DC | PRN

## 2017-10-25 MED ORDER — LIDOCAINE-PRILOCAINE 2.5-2.5 % EX CREA: TOPICAL_CREAM | CUTANEOUS | 3 refills | Status: DC

## 2017-10-25 MED FILL — DEXAMETHASONE 4 MG TABLET: 4 | 84 days supply | Qty: 40 | Fill #0

## 2017-10-25 MED FILL — LIDOCAINE-PRILOCAINE CREAM: 2.5-2.5 | 10 days supply | Qty: 30 | Fill #0

## 2017-10-25 MED FILL — ONDANSETRON HCL 8 MG TABLET: 8 | 7 days supply | Qty: 30 | Fill #0

## 2017-10-25 MED FILL — PROCHLORPERAZINE 10 MG TAB: 10 | 7 days supply | Qty: 30 | Fill #0

## 2017-10-25 NOTE — Progress Notes (Signed)
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 The intent is of curative intent.  We discussed some of the risks, benefits, side-effects of carboplatin & Taxol. Treatment is intravenous, every 3 weeks x 6 cycles  Some of the short term side-effects included, though not limited to, including weight loss, life threatening infections, risk of allergic reactions, need for transfusions of blood products, nausea, vomiting, change in bowel habits, loss of hair, admission to hospital for various reasons, and risks of death.   Long term side-effects are also discussed including risks of infertility, permanent damage to nerve function, hearing loss, chronic fatigue, kidney damage with possibility needing hemodialysis, and rare secondary malignancy including bone marrow disorders.  The patient is aware that the response rates discussed earlier is not guaranteed.  After a long discussion, patient made an informed decision to proceed with the prescribed plan of care.   Patient education material was dispensed. We discussed premedication with dexamethasone before chemotherapy. Due to her young age, I will not be prescribing G-CSF support Her elevated LFT and her morbid obesity contributed to very large doses of chemotherapy I plan to reduce the dose of calculated Taxol by 20% and to keep maximum dose of carboplatin at 750 mg We discussed the importance of dietary modification while on treatment  Elevated liver enzymes Elevated liver enzymes are likely due to fatty liver disease I have reviewed his CT imaging which showed no evidence of liver metastasis as a cause of her elevated liver enzymes I plan to reduce the dose of  Taxol by 20%   Orders Placed This Encounter  Procedures  . CBC with Differential (Cancer Center Only)    Standing Status:   Standing    Number of Occurrences:   20    Standing Expiration Date:   10/26/2018  . CMP ([facility] only)    Standing Status:   Standing    Number of Occurrences:   20    Standing Expiration Date:   10/26/2018    INTERVAL HISTORY: Please see below for problem oriented charting. 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  During the ER visit an ultrasound performed which identified an adnexal mass on the left, with no signs of torsion.  She was then seen in the offices of Dr. Barrie Dunker in June 2019 and a plan was made to proceed with surgical removal of the adnexa    08/23/2017 Surgery    On August 23, 2017 she was taken to the operating room for a laparoscopic left salpingo-oophorectomy.  Review of the operative note suggest that intraoperative findings were significant for a 6 cm left adnexal mass that was stuck in the cul-de-sac with some paraovarian adhesions noted.  It  did not appear to be malignant in appearance to the surgeons at the time of surgery.  The upper abdomen, diaphragms, and omentum were commented on as looking normal.  There was no ascites.  The left tube and ovary was removed, however there was some fragmentation of the specimen during removal, and therefore the left tube and ovary were sent as 2 specimens.  The first specimen is labeled ovary  biopsy for frozen section which revealed serous carcinoma and the second specimen is labeled left ovary on permanent this revealed serous carcinoma.  The operative note reports that the intraoperative diagnosis was borderline tumor, or reviewing the pathology report it states that the intraoperative diagnosis was left ovarian biopsy epithelial neoplasm at least borderline, cannot exclude carcinoma.  The frozen section of the second specimen was the same (epithelial neoplasm at least borderline cannot exclude carcinoma.).  The patient was seen back in the office by Dr. Barrie Dunker on September 06, 2017 and delivered her diagnosis of carcinoma.  At that time she was referred for consultation with gynecologic oncology.    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 No evidence of thoracic metastasis. 2. Small angular nodule in the RIGHT middle lobe is favored benign.  Abdomen / Pelvis Impression:  1. No evidence of local recurrence of ovarian carcinoma. 2. No evidence of metastatic disease in the abdomen pelvis. 3. No free fluid    09/25/2017 Tumor Marker    Patient's tumor was tested for the following markers: CA-125 Results of the tumor marker test revealed 66.4    10/02/2017 Pathology Results    1. Omentum, resection other than for tumor - NO CARCINOMA IDENTIFIED 2. Uterus +/- tubes/ovaries, neoplastic, right fallopian tube and ovary - CARCINOMA INVOLVING SEROSA OF THE UTERUS AND ENDOMETRIUM - NO EVIDENCE OF CARCINOMA IN THE CERVIX, RIGHT OVARY OR FALLOPIAN TUBE - LEIOMYOMATA (2 CM; LARGEST) - SEE COMMENT 3. Lymph nodes, regional resection, right pelvic - NO CARCINOMA IDENTIFIED IN NINE LYMPH NODES (0/9) 4. Lymph nodes, regional resection, left pelvic - METASTATIC CARCINOMA INVOLVING ONE OF FIVE NODES - SEE COMMENT Microscopic Comment 2. The left lateral aspect of the uterine serosa has carcinoma which appears endometrioid with  foci of squamous differentiation; serous features are not seen. There are two focal areas of endometrioid carcinoma confined to the endometrium. Based on the size of the left ovarian lesion, left pelvic node involvement and minimal involvement of the endometrium, this is favored to be a primary ovarian with uterine involvement. However, given the different histologic subtyping of these lesions, it would be beneficial to review the current case in conjunction with the patient's recently diagnosed ovarian serous carcinoma for accurate staging. This case was discussed with Dr. Denman George on October 05, 2017. Dr. Lyndon Code reviewed the case and agrees with the above diagnosis. 4. Cytokeratin AE1/3 was utilized and highlights isolated tumor cells and a focus of tumor with similar histology as that seen on the serosal surface of the endometrium.    10/02/2017 Pathology Results    PERITONEAL WASHING(SPECIMEN 1 OF 1 COLLECTED 10/02/17): ATYPICAL CELLS. SEE COMMENT.    10/02/2017 Surgery    Surgeon: Donaciano Eva   Operation: Robotic-assisted laparoscopic total hysterectomy with bilateral salpingoophorectomy, omentectomy, bilateral pelvic lymphadenectomy  Surgeon: Donaciano Eva  Operative Findings:  : 6cm grossly normal uterus (with pedunculated 1cm fibroid), normal appearing right tube and ovary. There was a thin layer of  apparent residual left ovary adherent to the uterus/left ovarian fossa (completely resected with this surgery). No ascites, normal appendix and small bowel. Normal appearing omentum. No suspicious nodes.  No residual visible cancer remaining at the completion of the surgery.          Specimens: washings, omentum, uterus with cervix and right tube and ovary, right and left pelvic lymph nodes.     10/16/2017 Cancer Staging    Staging form: Ovary, Fallopian Tube, and Primary Peritoneal Carcinoma, AJCC 8th Edition - Pathologic: FIGO Stage III (pT3, pN1, cM0) - Signed by Heath Lark, MD on  10/17/2017    10/24/2017 Procedure    Ultrasound and fluoroscopically guided right internal jugular single lumen power port catheter insertion. Tip in the SVC/RA junction. Catheter ready for use.     REVIEW OF SYSTEMS:   Constitutional: Denies fevers, chills or abnormal weight loss Eyes: Denies blurriness of vision Ears, nose, mouth, throat, and face: Denies mucositis or sore throat Respiratory: Denies cough, dyspnea or wheezes Cardiovascular: Denies palpitation, chest discomfort or lower extremity swelling Gastrointestinal:  Denies nausea, heartburn or change in bowel habits Skin: Denies abnormal skin rashes Lymphatics: Denies new lymphadenopathy or easy bruising Neurological:Denies numbness, tingling or new weaknesses Behavioral/Psych: Mood is stable, no new changes  All other systems were reviewed with the patient and are negative.  I have reviewed the past medical history, past surgical history, social history and family history with the patient and they are unchanged from previous note.  ALLERGIES:  is allergic to other.  MEDICATIONS:  Current Outpatient Medications  Medication Sig Dispense Refill  . acetaminophen (TYLENOL) 500 MG tablet Take 2 tablets (1,000 mg total) by mouth every 6 (six) hours. 30 tablet 0  . dexamethasone (DECADRON) 4 MG tablet Take 5 tabs at the night before and 5 tab the morning of chemotherapy, every 3 weeks, by mouth 60 tablet 0  . estrogens, conjugated, (PREMARIN) 0.625 MG tablet Take 1 tablet (0.625 mg total) by mouth daily. Take daily for 21 days then do not take for 7 days. 30 tablet 11  . ibuprofen (ADVIL,MOTRIN) 200 MG tablet Take 200 mg by mouth daily as needed for headache or moderate pain.    Marland Kitchen lidocaine-prilocaine (EMLA) cream Apply to affected area once 30 g 3  . lisinopril (PRINIVIL,ZESTRIL) 10 MG tablet Take 10 mg by mouth daily.   1  . mometasone (ELOCON) 0.1 % cream Apply 1 application topically 2 (two) times daily as needed for rash.  12   . ondansetron (ZOFRAN) 8 MG tablet Take 1 tablet (8 mg total) by mouth every 8 (eight) hours as needed for refractory nausea / vomiting. Start on day 3 after chemo. 30 tablet 1  . oxyCODONE (OXY IR/ROXICODONE) 5 MG immediate release tablet Take 1 tablet (5 mg total) by mouth every 4 (four) hours as needed for severe pain or breakthrough pain. 30 tablet 0  . prochlorperazine (COMPAZINE) 10 MG tablet Take 1 tablet (10 mg total) by mouth every 6 (six) hours as needed (Nausea or vomiting). 30 tablet 1  . senna-docusate (SENOKOT-S) 8.6-50 MG tablet Take 2 tablets by mouth at bedtime. 30 tablet 0   No current facility-administered medications for this visit.     PHYSICAL EXAMINATION: ECOG PERFORMANCE STATUS: 1 - Symptomatic but completely ambulatory  Vitals:   10/25/17 1138  BP: (!) 136/45  Pulse: 72  Resp: 18  Temp: 98.3 F (36.8 C)  SpO2: 99%   Filed Weights   10/25/17 1138  Weight: 183 lb 6.4 oz (83.2 kg)    GENERAL:alert, no distress and comfortable SKIN: skin color, texture, turgor are normal, no rashes or significant lesions EYES: normal, Conjunctiva are pink and non-injected, sclera clear OROPHARYNX:no exudate, no erythema and lips, buccal mucosa, and tongue normal  NECK: supple, thyroid normal size, non-tender, without nodularity LYMPH:  no palpable lymphadenopathy in the cervical, axillary or inguinal LUNGS: clear to auscultation and percussion with normal breathing effort HEART: regular rate & rhythm and no murmurs and no lower extremity edema ABDOMEN:abdomen soft, non-tender and normal bowel sounds Musculoskeletal:no cyanosis of digits and no clubbing  NEURO: alert & oriented x 3 with fluent speech, no focal motor/sensory deficits  LABORATORY DATA:  I have reviewed the data as listed    Component Value Date/Time   NA 140 10/25/2017 1113   K 4.3 10/25/2017 1113   CL 105 10/25/2017 1113   CO2 26 10/25/2017 1113   GLUCOSE 104 (H) 10/25/2017 1113   BUN 11 10/25/2017  1113   CREATININE 0.70 10/25/2017 1113   CREATININE 0.67 09/18/2017 1234   CALCIUM 10.0 10/25/2017 1113   PROT 7.6 10/25/2017 1113   ALBUMIN 4.1 10/25/2017 1113   AST 60 (H) 10/25/2017 1113   ALT 84 (H) 10/25/2017 1113   ALKPHOS 72 10/25/2017 1113   BILITOT 0.3 10/25/2017 1113   GFRNONAA >60 10/25/2017 1113   GFRNONAA >60 09/18/2017 1234   GFRAA >60 10/25/2017 1113   GFRAA >60 09/18/2017 1234    No results found for: SPEP, UPEP  Lab Results  Component Value Date   WBC 9.0 10/25/2017   NEUTROABS 5.9 10/25/2017   HGB 12.7 10/25/2017   HCT 38.9 10/25/2017   MCV 82.9 10/25/2017   PLT 399 10/25/2017      Chemistry      Component Value Date/Time   NA 140 10/25/2017 1113   K 4.3 10/25/2017 1113   CL 105 10/25/2017 1113   CO2 26 10/25/2017 1113   BUN 11 10/25/2017 1113   CREATININE 0.70 10/25/2017 1113   CREATININE 0.67 09/18/2017 1234      Component Value Date/Time   CALCIUM 10.0 10/25/2017 1113   ALKPHOS 72 10/25/2017 1113   AST 60 (H) 10/25/2017 1113   ALT 84 (H) 10/25/2017 1113   BILITOT 0.3 10/25/2017 1113       RADIOGRAPHIC STUDIES: I have personally reviewed the radiological images as listed and agreed with the findings in the report. Ir Imaging Guided Port Insertion  Result Date: 10/24/2017 CLINICAL DATA:  Ovarian CA, access for chemotherapy EXAM: RIGHT INTERNAL JUGULAR SINGLE LUMEN POWER PORT CATHETER INSERTION Date:  10/24/2017 10/24/2017 4:05 pm Radiologist:  Jerilynn Mages. Daryll Brod, MD Guidance:  Ultrasound and fluoroscopic MEDICATIONS: Ancef 2 g; The antibiotic was administered within an appropriate time interval prior to skin puncture. ANESTHESIA/SEDATION: Versed 1.0 mg IV; Fentanyl 100 mcg IV; Moderate Sedation Time:  20 minutes The patient was continuously monitored during the procedure by the interventional radiology nurse under my direct supervision. FLUOROSCOPY TIME:  0 minutes, 18 seconds (5.6 mGy) COMPLICATIONS: None immediate. CONTRAST:  None PROCEDURE:  Informed consent was obtained from the patient following explanation of the procedure, risks, benefits and alternatives. The patient understands, agrees and consents for the procedure. All questions were addressed. A time out was performed. Maximal barrier sterile technique utilized including caps, mask, sterile gowns, sterile gloves, large sterile drape, hand hygiene, and 2% chlorhexidine scrub. Under sterile conditions and local anesthesia, right internal jugular micropuncture venous access was performed. Access  was performed with ultrasound. Images were obtained for documentation of the patent right internal jugular vein. A guide wire was inserted followed by a transitional dilator. This allowed insertion of a guide wire and catheter into the IVC. Measurements were obtained from the SVC / RA junction back to the right IJ venotomy site. In the right infraclavicular chest, a subcutaneous pocket was created over the second anterior rib. This was done under sterile conditions and local anesthesia. 1% lidocaine with epinephrine was utilized for this. A 2.5 cm incision was made in the skin. Blunt dissection was performed to create a subcutaneous pocket over the right pectoralis major muscle. The pocket was flushed with saline vigorously. There was adequate hemostasis. The port catheter was assembled and checked for leakage. The port catheter was secured in the pocket with two retention sutures. The tubing was tunneled subcutaneously to the right venotomy site and inserted into the SVC/RA junction through a valved peel-away sheath. Position was confirmed with fluoroscopy. Images were obtained for documentation. The patient tolerated the procedure well. No immediate complications. Incisions were closed in a two layer fashion with 4 - 0 Vicryl suture. Dermabond was applied to the skin. The port catheter was accessed, blood was aspirated followed by saline and heparin flushes. Needle was removed. A dry sterile dressing was  applied. IMPRESSION: Ultrasound and fluoroscopically guided right internal jugular single lumen power port catheter insertion. Tip in the SVC/RA junction. Catheter ready for use. Electronically Signed   By: Jerilynn Mages.  Shick M.D.   On: 10/24/2017 16:10    All questions were answered. The patient knows to call the clinic with any problems, questions or concerns. No barriers to learning was detected.  I spent 25 minutes counseling the patient face to face. The total time spent in the appointment was 30 minutes and more than 50% was on counseling and review of test results  Heath Lark, MD 10/25/2017 3:50 PM

## 2017-10-25 NOTE — Assessment & Plan Note (Signed)
Elevated liver enzymes are likely due to fatty liver disease I have reviewed his CT imaging which showed no evidence of liver metastasis as a cause of her elevated liver enzymes I plan to reduce the dose of Taxol by 20%

## 2017-10-25 NOTE — Assessment & Plan Note (Addendum)
We discussed the role of adjuvant chemotherapy She has genetic counseling referral, results pending We reviewed the NCCN guidelines We discussed the role of chemotherapy. The intent is of curative intent.  We discussed some of the risks, benefits, side-effects of carboplatin & Taxol. Treatment is intravenous, every 3 weeks x 6 cycles  Some of the short term side-effects included, though not limited to, including weight loss, life threatening infections, risk of allergic reactions, need for transfusions of blood products, nausea, vomiting, change in bowel habits, loss of hair, admission to hospital for various reasons, and risks of death.   Long term side-effects are also discussed including risks of infertility, permanent damage to nerve function, hearing loss, chronic fatigue, kidney damage with possibility needing hemodialysis, and rare secondary malignancy including bone marrow disorders.  The patient is aware that the response rates discussed earlier is not guaranteed.  After a long discussion, patient made an informed decision to proceed with the prescribed plan of care.   Patient education material was dispensed. We discussed premedication with dexamethasone before chemotherapy. Due to her young age, I will not be prescribing G-CSF support Her elevated LFT and her morbid obesity contributed to very large doses of chemotherapy I plan to reduce the dose of calculated Taxol by 20% and to keep maximum dose of carboplatin at 750 mg We discussed the importance of dietary modification while on treatment

## 2017-10-26 ENCOUNTER — Inpatient Hospital Stay: Payer: Self-pay

## 2017-10-26 ENCOUNTER — Telehealth: Payer: Self-pay | Admitting: Genetic Counselor

## 2017-10-26 ENCOUNTER — Encounter: Payer: Self-pay | Admitting: Genetic Counselor

## 2017-10-26 VITALS — BP 142/68 | HR 79 | Temp 98.0°F | Resp 14

## 2017-10-26 DIAGNOSIS — Z1379 Encounter for other screening for genetic and chromosomal anomalies: Secondary | ICD-10-CM | POA: Insufficient documentation

## 2017-10-26 DIAGNOSIS — C562 Malignant neoplasm of left ovary: Secondary | ICD-10-CM

## 2017-10-26 LAB — CA 125: Cancer Antigen (CA) 125: 24.2 U/mL (ref 0.0–38.1)

## 2017-10-26 MED ORDER — DIPHENHYDRAMINE HCL 50 MG/ML IJ SOLN
50.0000 mg | Freq: Once | INTRAMUSCULAR | Status: AC
  Administered 2017-10-26: 50 mg via INTRAVENOUS

## 2017-10-26 MED ORDER — DIPHENHYDRAMINE HCL 50 MG/ML IJ SOLN
INTRAMUSCULAR | Status: AC
  Filled 2017-10-26: qty 1

## 2017-10-26 MED ORDER — PALONOSETRON HCL INJECTION 0.25 MG/5ML
0.2500 mg | Freq: Once | INTRAVENOUS | Status: AC
  Administered 2017-10-26: 0.25 mg via INTRAVENOUS

## 2017-10-26 MED ORDER — FAMOTIDINE IN NACL 20-0.9 MG/50ML-% IV SOLN
20.0000 mg | Freq: Once | INTRAVENOUS | Status: AC
  Administered 2017-10-26: 20 mg via INTRAVENOUS

## 2017-10-26 MED ORDER — SODIUM CHLORIDE 0.9% FLUSH
10.0000 mL | INTRAVENOUS | Status: DC | PRN
  Administered 2017-10-26: 10 mL
  Filled 2017-10-26: qty 10

## 2017-10-26 MED ORDER — SODIUM CHLORIDE 0.9 % IV SOLN
Freq: Once | INTRAVENOUS | Status: AC
  Administered 2017-10-26: 10:00:00 via INTRAVENOUS
  Filled 2017-10-26: qty 5

## 2017-10-26 MED ORDER — PALONOSETRON HCL INJECTION 0.25 MG/5ML
INTRAVENOUS | Status: AC
  Filled 2017-10-26: qty 5

## 2017-10-26 MED ORDER — HEPARIN SOD (PORK) LOCK FLUSH 100 UNIT/ML IV SOLN
500.0000 [IU] | Freq: Once | INTRAVENOUS | Status: AC | PRN
  Administered 2017-10-26: 500 [IU]
  Filled 2017-10-26: qty 5

## 2017-10-26 MED ORDER — SODIUM CHLORIDE 0.9 % IV SOLN
750.0000 mg | Freq: Once | INTRAVENOUS | Status: AC
  Administered 2017-10-26: 750 mg via INTRAVENOUS
  Filled 2017-10-26: qty 75

## 2017-10-26 MED ORDER — FAMOTIDINE IN NACL 20-0.9 MG/50ML-% IV SOLN
INTRAVENOUS | Status: AC
  Filled 2017-10-26: qty 50

## 2017-10-26 MED ORDER — SODIUM CHLORIDE 0.9 % IV SOLN
140.0000 mg/m2 | Freq: Once | INTRAVENOUS | Status: AC
  Administered 2017-10-26: 276 mg via INTRAVENOUS
  Filled 2017-10-26: qty 46

## 2017-10-26 MED ORDER — SODIUM CHLORIDE 0.9 % IV SOLN
Freq: Once | INTRAVENOUS | Status: AC
  Administered 2017-10-26: 09:00:00 via INTRAVENOUS
  Filled 2017-10-26: qty 250

## 2017-10-26 NOTE — Patient Instructions (Addendum)
[facility] Discharge Instructions for Patients Receiving Chemotherapy  Today you received the following chemotherapy agents, taxol and carboplatin.  To help prevent nausea and vomiting after your treatment, we encourage you to take your nausea medication. DO NOT TAKE ZOFRAN FOR THREE DAYS AFTER TREATMENT.   If you develop nausea and vomiting that is not controlled by your nausea medication, call the clinic.   BELOW ARE SYMPTOMS THAT SHOULD BE REPORTED IMMEDIATELY:  *FEVER GREATER THAN 100.5 F  *CHILLS WITH OR WITHOUT FEVER  NAUSEA AND VOMITING THAT IS NOT CONTROLLED WITH YOUR NAUSEA MEDICATION  *UNUSUAL SHORTNESS OF BREATH  *UNUSUAL BRUISING OR BLEEDING  TENDERNESS IN MOUTH AND THROAT WITH OR WITHOUT PRESENCE OF ULCERS  *URINARY PROBLEMS  *BOWEL PROBLEMS  UNUSUAL RASH Items with * indicate a potential emergency and should be followed up as soon as possible.  Feel free to call the clinic should you have any questions or concerns. The clinic phone number is [phone].  Please show the [facility] at check-in to the Emergency Department and triage nurse.

## 2017-10-26 NOTE — Telephone Encounter (Signed)
LM on VM that results are back.  Asked that she please CB.

## 2017-10-30 ENCOUNTER — Encounter: Payer: Self-pay | Admitting: Pharmacy Technician

## 2017-10-30 NOTE — Progress Notes (Signed)
The patient is approved for drug assistance by [employer] for [employer]. Enrollment is based on self pay and is effective until 02/26/18. Drug replacement will begin on the first DOS 10/26/17.

## 2017-10-31 ENCOUNTER — Inpatient Hospital Stay: Payer: Self-pay | Attending: Gynecologic Oncology | Admitting: Gynecologic Oncology

## 2017-10-31 ENCOUNTER — Inpatient Hospital Stay: Payer: Self-pay

## 2017-10-31 ENCOUNTER — Encounter: Payer: Self-pay | Admitting: Gynecologic Oncology

## 2017-10-31 ENCOUNTER — Encounter: Payer: Self-pay | Admitting: Hematology and Oncology

## 2017-10-31 VITALS — BP 122/65 | HR 95 | Temp 98.7°F | Resp 18 | Ht 63.0 in | Wt 183.5 lb

## 2017-10-31 DIAGNOSIS — G620 Drug-induced polyneuropathy: Secondary | ICD-10-CM | POA: Insufficient documentation

## 2017-10-31 DIAGNOSIS — Z90722 Acquired absence of ovaries, bilateral: Secondary | ICD-10-CM | POA: Insufficient documentation

## 2017-10-31 DIAGNOSIS — Z5111 Encounter for antineoplastic chemotherapy: Secondary | ICD-10-CM | POA: Insufficient documentation

## 2017-10-31 DIAGNOSIS — Z7189 Other specified counseling: Secondary | ICD-10-CM

## 2017-10-31 DIAGNOSIS — Z79899 Other long term (current) drug therapy: Secondary | ICD-10-CM | POA: Insufficient documentation

## 2017-10-31 DIAGNOSIS — M2550 Pain in unspecified joint: Secondary | ICD-10-CM | POA: Insufficient documentation

## 2017-10-31 DIAGNOSIS — C562 Malignant neoplasm of left ovary: Secondary | ICD-10-CM

## 2017-10-31 DIAGNOSIS — R300 Dysuria: Secondary | ICD-10-CM

## 2017-10-31 DIAGNOSIS — Z90710 Acquired absence of both cervix and uterus: Secondary | ICD-10-CM | POA: Insufficient documentation

## 2017-10-31 DIAGNOSIS — R739 Hyperglycemia, unspecified: Secondary | ICD-10-CM | POA: Insufficient documentation

## 2017-10-31 DIAGNOSIS — R110 Nausea: Secondary | ICD-10-CM | POA: Insufficient documentation

## 2017-10-31 DIAGNOSIS — C541 Malignant neoplasm of endometrium: Secondary | ICD-10-CM | POA: Insufficient documentation

## 2017-10-31 MED ORDER — ESTROGENS CONJUGATED 0.625 MG PO TABS: 0.6250 mg | ORAL_TABLET | Freq: Every day | ORAL | 11 refills | Status: DC

## 2017-10-31 MED FILL — PREMARIN 0.625 MG TABLET: 0.625 | 28 days supply | Qty: 21 | Fill #0

## 2017-10-31 NOTE — Progress Notes (Signed)
Follow-up Note: Gyn-Onc  Consult was intially requested by Dr. Barrie Dunker for Kristy Franklin evaluation of Kristy Franklin Kristy Franklin Franklin 47 y.o. female  CC:  Chief Complaint  Kristy Franklin Franklin presents with  . Ovarian cancer on left [facility])   Seen with translator (spanish)  Assessment/Plan:  [name] Kristy Franklin Franklin  is a 47 y.o.  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 Genetics pending.  I discussed Kristy Franklin nature of ovarian cancer with Kristy Franklin Kristy Franklin Franklin.  I discussed that due to Kristy Franklin advanced stage high-grade nature of her lesion we are recommending adjuvant chemotherapy with 6 cycles of carboplatin and paclitaxel.  I will see her back after completion of chemotherapy after baseline post-treatment scans have been performed.   HPI: [name] Kristy Franklin Franklin is a 47 year old P2 who is seen in consultation at Kristy Franklin request of Dr Barrie Dunker for high grade serous ovarian cancer, clinical stage IC.  Kristy Franklin Kristy Franklin Franklin reports a history of having seen Kristy Franklin emergency room in [place] in May or June 2019 when she developed left lower quadrant pain.  During Kristy Franklin ER visit an ultrasound performed which identified an adnexal mass on Kristy Franklin left, with no signs of torsion.  She was then seen in Kristy Franklin offices of Dr. Barrie Dunker in June 2019 and a plan was made to proceed with surgical removal of Kristy Franklin adnexa.  On August 23, 2017 she was taken to Kristy Franklin operating room for a laparoscopic left salpingo-oophorectomy.  Review of Kristy Franklin operative note suggest that intraoperative findings were significant for a 6 cm left adnexal mass that was stuck in Kristy Franklin cul-de-sac with some paraovarian adhesions noted.  It did not appear to be malignant in appearance to Kristy Franklin surgeons at Kristy Franklin time of surgery.  Kristy Franklin upper abdomen, diaphragms, and omentum were commented on as looking normal.  There was no ascites.  Kristy Franklin left tube and ovary was removed, however there was some fragmentation of Kristy Franklin specimen during removal, and  therefore Kristy Franklin left tube and ovary were sent as 2 specimens.  Kristy Franklin first specimen is labeled ovary biopsy for frozen section which revealed serous carcinoma and Kristy Franklin second specimen is labeled left ovary on permanent this revealed serous carcinoma.  Kristy Franklin operative note reports that Kristy Franklin intraoperative diagnosis was borderline tumor, or reviewing Kristy Franklin pathology report it states that Kristy Franklin intraoperative diagnosis was left ovarian biopsy epithelial neoplasm at least borderline, cannot exclude carcinoma.  Kristy Franklin frozen section of Kristy Franklin second specimen was Kristy Franklin same (epithelial neoplasm at least borderline cannot exclude carcinoma.).  Kristy Franklin Kristy Franklin Franklin was seen back in Kristy Franklin office by Dr. Barrie Dunker on September 06, 2017 and delivered her diagnosis of carcinoma.  At that time she was referred for consultation with gynecologic oncology.  Her past medical history 6 significant for obesity and hypertension.  She has had 2 prior cesarean deliveries.  She does not desire future fertility as she is completed childbearing.  She has no history of abnormal Pap smears and her last Pap was 7 to 8 months ago was normal.  Her past surgical history is significant for 2 cesarean sections and a laparoscopic cholecystectomy.  Her only family history is significant for father with prostate cancer at age 83.  Interval Hx:  On 10/02/17 she underwent robotic assisted total hysterectomy, RSO, omentectomy, bilateral pelvic lymphadenectomy (PA node dissection not feasible due to body habitus, and no suspicious nodes on preop imaging). Final pathology revealed residual carcinoma on Kristy Franklin surface of Kristy Franklin uterus (serosa) and separate additional endometrioid carcinoma  in endometrium (stage I, thought to be second primary). Metastatic carcinoma (consistent with serous) in one of 5 left pelvic lymph nodes (right side all benign). Omentum was benign (though atypical cells were identified in peritoneal washings).  CT chest/abd/pelvis on 09/24/17 (prior to staging surgery)  showed no evidence of gross metastatic disease.  She was determined to most likely have synchronous endometrial (stage I) and ovarian (stage IIIC) cancers of endometrioid and high grade serous histologies respectively after pathology and multidisciplinary conference review. Recommendation was for 6 cycles of adjuvant carboplatin and paclitaxel chemotherapy with genetics consultation.  She commenced chemotherapy with Dr Alvy Bimler on 10/26/17.  Current Meds:  Outpatient Encounter Medications as of 10/31/2017  Medication Sig  . acetaminophen (TYLENOL) 500 MG tablet Take 2 tablets (1,000 mg total) by mouth every 6 (six) hours.  Marland Kitchen dexamethasone (DECADRON) 4 MG tablet Take 5 tabs at Kristy Franklin night before and 5 tab Kristy Franklin morning of chemotherapy, every 3 weeks, by mouth  . estrogens, conjugated, (PREMARIN) 0.625 MG tablet Take 1 tablet (0.625 mg total) by mouth daily. Take daily for 21 days then do not take for 7 days.  Marland Kitchen ibuprofen (ADVIL,MOTRIN) 200 MG tablet Take 200 mg by mouth daily as needed for headache or moderate pain.  Marland Kitchen lidocaine-prilocaine (EMLA) cream Apply to affected area once  . lisinopril (PRINIVIL,ZESTRIL) 10 MG tablet Take 10 mg by mouth daily.   . mometasone (ELOCON) 0.1 % cream Apply 1 application topically 2 (two) times daily as needed for rash.  . ondansetron (ZOFRAN) 8 MG tablet Take 1 tablet (8 mg total) by mouth every 8 (eight) hours as needed for refractory nausea / vomiting. Start on day 3 after chemo.  Marland Kitchen oxyCODONE (OXY IR/ROXICODONE) 5 MG immediate release tablet Take 1 tablet (5 mg total) by mouth every 4 (four) hours as needed for severe pain or breakthrough pain.  Marland Kitchen prochlorperazine (COMPAZINE) 10 MG tablet Take 1 tablet (10 mg total) by mouth every 6 (six) hours as needed (Nausea or vomiting).  Marland Kitchen senna-docusate (SENOKOT-S) 8.6-50 MG tablet Take 2 tablets by mouth at bedtime.  . [DISCONTINUED] estrogens, conjugated, (PREMARIN) 0.625 MG tablet Take 1 tablet (0.625 mg total) by mouth  daily. Take daily for 21 days then do not take for 7 days.   No facility-administered encounter medications on file as of 10/31/2017.     Allergy:  Allergies  Allergen Reactions  . Other Rash    Pork    Social Hx:   Social History   Socioeconomic History  . Marital status: Married    Spouse name: Media planner  . Number of children: 2  . Years of education: Not on file  . Highest education level: Not on file  Occupational History  . Occupation: unemployed  Social Needs  . Financial resource strain: Not on file  . Food insecurity:    Worry: Not on file    Inability: Not on file  . Transportation needs:    Medical: Not on file    Non-medical: Not on file  Tobacco Use  . Smoking status: Never Smoker  . Smokeless tobacco: Never Used  Substance and Sexual Activity  . Alcohol use: Never    Frequency: Never  . Drug use: Never  . Sexual activity: Not Currently    Comment: calendar  Lifestyle  . Physical activity:    Days per week: Not on file    Minutes per session: Not on file  . Stress: Not on file  Relationships  . Social connections:  Talks on phone: Not on file    Gets together: Not on file    Attends religious service: Not on file    Active member of club or organization: Not on file    Attends meetings of clubs or organizations: Not on file    Relationship status: Not on file  . Intimate partner violence:    Fear of current or ex partner: Not on file    Emotionally abused: Not on file    Physically abused: Not on file    Forced sexual activity: Not on file  Other Topics Concern  . Not on file  Social History Narrative  . Not on file    Past Surgical Hx:  Past Surgical History:  Procedure Laterality Date  . CESAREAN SECTION     x2  . CHOLECYSTECTOMY     20 years ago  . IR IMAGING GUIDED PORT INSERTION  10/24/2017  . OMENTECTOMY N/A 10/02/2017   Procedure: OMENTECTOMY;  Surgeon: Everitt Amber, MD;  Location: [facility];  Service: Gynecology;  Laterality: N/A;   . OTHER SURGICAL HISTORY  07/2017   Left ovary removed  . ROBOTIC ASSISTED TOTAL HYSTERECTOMY WITH BILATERAL SALPINGO OOPHERECTOMY Right 10/02/2017   Procedure: XI ROBOTIC ASSISTED TOTAL HYSTERECTOMY WITH RIGHT SALPINGO OOPHORECTOMY;  Surgeon: Everitt Amber, MD;  Location: [facility];  Service: Gynecology;  Laterality: Right;  . ROBOTIC PELVIC AND PARA-AORTIC LYMPH NODE DISSECTION Bilateral 10/02/2017   Procedure: XI ROBOTIC PELVIC LYMPH NODE DISSECTION;  Surgeon: Everitt Amber, MD;  Location: [facility];  Service: Gynecology;  Laterality: Bilateral;    Past Medical Hx:  Past Medical History:  Diagnosis Date  . Abdominal pain    Right mid only with bowel movements  . Blood in urine   . Change in bowel movement    more green color  . Family history of prostate cancer   . GERD (gastroesophageal reflux disease)   . Headache    after menstrual cycle every month  . History of gallstones   . Hypertension   . Neck pain on right side    pulsating  . Numbness and tingling of right leg   . Obesity   . Ovarian cancer, left ([place])   . PPD positive   . Skin rash    Chest, forehead, left arm    Past Gynecological History:  C/s x 2. No history of abnormal paps.  Kristy Franklin Franklin's last menstrual period was 09/24/2017.  Family Hx:  Family History  Problem Relation Age of Onset  . Hypertension Mother   . Prostate cancer Father 9       d. 16  . Diabetes Paternal Aunt   . Heart attack Paternal Aunt   . Heart attack Paternal Grandmother 95  . Thyroid disease Other 14    Review of Systems:  Constitutional  Feels well,    ENT Normal appearing ears and nares bilaterally Skin/Breast  No rash, sores, jaundice, itching, dryness Cardiovascular  No chest pain, shortness of breath, or edema  Pulmonary  No cough or wheeze.  Gastro Intestinal  No nausea, vomitting, or diarrhoea. No bright red blood per rectum, no abdominal pain, change in bowel movement, or constipation.  Genito Urinary  No frequency,  urgency, dysuria, no pain, no bleeding. Musculo Skeletal  No myalgia, arthralgia, joint swelling or pain  Neurologic  No weakness, numbness, change in gait,  Psychology  No depression, anxiety, insomnia.   Vitals:  Blood pressure 122/65, pulse 95, temperature 98.7 F (37.1 C), temperature source Oral,  resp. rate 18, height 5\' 3"  (1.6 m), weight 183 lb 8 oz (83.2 kg), last menstrual period 09/24/2017, SpO2 100 %.  Physical Exam: WD in NAD Neck  Supple NROM, without any enlargements.  Lymph Node Survey No cervical supraclavicular or inguinal adenopathy Cardiovascular  Pulse normal rate, regularity and rhythm. S1 and S2 normal.  Lungs  Clear to auscultation bilateraly, without wheezes/crackles/rhonchi. Good air movement.  Skin  No rash/lesions/breakdown  Psychiatry  Alert and oriented to person, place, and time  Abdomen  Normoactive bowel sounds, abdomen soft, non-tender and obese without evidence of hernia. Incisions well healed Back No CVA tenderness Genito Urinary  Vaginal cuff in tact, well healed, no blood, no masses  Rectal  deferred Extremities  No bilateral cyanosis, clubbing or edema.   30 minutes of direct face to face counseling time was spent with Kristy Franklin Kristy Franklin Franklin with aid of a translator. This included discussion about prognosis, therapy recommendations and postoperative side effects and are beyond Kristy Franklin scope of routine postoperative care.   Thereasa Solo, MD  10/31/2017, 12:21 PM

## 2017-10-31 NOTE — Progress Notes (Signed)
Patient came in with proof of income to apply for grant to assist with medications.  Patient approved for one-time $400 grant. She has a copy of the approval letter and expense sheet along with outpatient pharmacy information. Called [facility] to advise of approval and to proceed with the fill. Directed patient to pharmacy to pick up meds. Advised at this time she may use grant for medications and transportation due to being uninsured. She verbalized understanding.

## 2017-10-31 NOTE — Patient Instructions (Signed)
Dr Denman George will see you again after completing chemotherapy.  Dr Denman George has transferred your premarin prescription to [facility].

## 2017-11-01 ENCOUNTER — Encounter: Payer: Self-pay | Admitting: Hematology and Oncology

## 2017-11-01 LAB — URINE CULTURE

## 2017-11-01 NOTE — Progress Notes (Signed)
Pt was also approved for the $400 [employer] grant.

## 2017-11-02 ENCOUNTER — Other Ambulatory Visit: Payer: Self-pay

## 2017-11-02 DIAGNOSIS — R300 Dysuria: Secondary | ICD-10-CM

## 2017-11-02 MED ORDER — NITROFURANTOIN MONOHYD MACRO 100 MG PO CAPS: 100.0000 mg | ORAL_CAPSULE | Freq: Two times a day (BID) | ORAL | 0 refills | Status: AC

## 2017-11-02 NOTE — Progress Notes (Signed)
Incoming call from patient and reported she recently was seen by Dr Denman George and reported to her she was having burning with urination, "still having burning and is painful" with urination and was waiting for her urine culture results.  Per Melissa CrossNP- sample was contaminated and can prescribe Macrobid 100 mg twice a day for 5 days and see if helps.  Pt reports she would like to try that - went on to say she tried drinking cranberry juice and didn't help and denies fever/chills. Then patient said she was having a "little itch" at vaginal area right where she just peed. Let her know that it's Ok to try Monistat in area and see if helps and start Macrobid but per [facility] NP, better to recheck urine or been seen for symptoms to make sure it's UTI or yeast.  Pt said she was recently examined by Dr Denman George and pt said its really not that itchy and not that often and that she prefers to try Macrobid first. Melissa NP also said she can try using peri-type bottle or pouring water against vaginal area when she pees to see if that eliminates the burning.  Pt said she really thinks it is not yeast and prefers to try antibiotic first.  Instructed to report any worsening symptoms or if burning continues, or fever, chills.  Voiced understanding. No other needs per pt at this time and I let patient know that our office will check to see how she is doing Monday as well.

## 2017-11-05 ENCOUNTER — Telehealth: Payer: Self-pay

## 2017-11-05 NOTE — Telephone Encounter (Addendum)
Used [employer] # [id number], pt answered.  Using interpreter, asked patient she is feeling now since starting [place] (see my previous note from Friday).  Pt reports she is no longer having burning or pain and was also able to use Monistat over the counter for itch and it helped also. Provided our contact info and encouraged her to call if she has any worsening of symptoms or other concerns.    Pt voiced understanding. No other needs per pt at this time.

## 2017-11-05 NOTE — Telephone Encounter (Signed)
Outgoing call to patient regarding my last note to see if patient is feeling better and to see if she thinks Macrobid has helped her urinary symptoms.  No answer, left VM with our contact info.

## 2017-11-16 NOTE — Addendum Note (Signed)
Addendum  created 11/16/17 1255 by Janeece Riggers, MD   Intraprocedure Staff edited

## 2017-11-19 ENCOUNTER — Inpatient Hospital Stay: Payer: Self-pay

## 2017-11-19 ENCOUNTER — Encounter: Payer: Self-pay | Admitting: Hematology and Oncology

## 2017-11-19 ENCOUNTER — Inpatient Hospital Stay (HOSPITAL_BASED_OUTPATIENT_CLINIC_OR_DEPARTMENT_OTHER): Payer: Self-pay | Admitting: Hematology and Oncology

## 2017-11-19 ENCOUNTER — Telehealth: Payer: Self-pay | Admitting: Hematology and Oncology

## 2017-11-19 VITALS — BP 130/68 | HR 105

## 2017-11-19 DIAGNOSIS — C562 Malignant neoplasm of left ovary: Secondary | ICD-10-CM

## 2017-11-19 DIAGNOSIS — R110 Nausea: Secondary | ICD-10-CM

## 2017-11-19 DIAGNOSIS — R739 Hyperglycemia, unspecified: Secondary | ICD-10-CM

## 2017-11-19 DIAGNOSIS — T451X5A Adverse effect of antineoplastic and immunosuppressive drugs, initial encounter: Secondary | ICD-10-CM

## 2017-11-19 DIAGNOSIS — T380X5A Adverse effect of glucocorticoids and synthetic analogues, initial encounter: Secondary | ICD-10-CM

## 2017-11-19 DIAGNOSIS — G620 Drug-induced polyneuropathy: Secondary | ICD-10-CM

## 2017-11-19 DIAGNOSIS — Z79899 Other long term (current) drug therapy: Secondary | ICD-10-CM

## 2017-11-19 DIAGNOSIS — M2550 Pain in unspecified joint: Secondary | ICD-10-CM

## 2017-11-19 LAB — COMPREHENSIVE METABOLIC PANEL
ALT: 38 U/L (ref 0–44)
AST: 29 U/L (ref 15–41)
Albumin: 4.1 g/dL (ref 3.5–5.0)
Alkaline Phosphatase: 98 U/L (ref 38–126)
Anion gap: 13 (ref 5–15)
BILIRUBIN TOTAL: 0.2 mg/dL — AB (ref 0.3–1.2)
BUN: 11 mg/dL (ref 6–20)
CO2: 21 mmol/L — ABNORMAL LOW (ref 22–32)
Calcium: 10.4 mg/dL — ABNORMAL HIGH (ref 8.9–10.3)
Chloride: 106 mmol/L (ref 98–111)
Creatinine, Ser: 0.77 mg/dL (ref 0.44–1.00)
GFR calc Af Amer: >60 mL/min (ref >60)
Glucose, Bld: 280 mg/dL — ABNORMAL HIGH (ref 70–99)
Potassium: 3.8 mmol/L (ref 3.5–5.1)
Sodium: 140 mmol/L (ref 135–145)
Total Protein: 8 g/dL (ref 6.5–8.1)

## 2017-11-19 LAB — CBC WITH DIFFERENTIAL/PLATELET
Basophils Absolute: 0.1 10*3/uL (ref 0.0–0.1)
Basophils Relative: 1 %
Eosinophils Absolute: 0 10*3/uL (ref 0.0–0.5)
Eosinophils Relative: 0 %
HCT: 38.8 % (ref 34.8–46.6)
Hemoglobin: 12.6 g/dL (ref 11.6–15.9)
LYMPHS ABS: 1.2 10*3/uL (ref 0.9–3.3)
LYMPHS PCT: 9 %
MCH: 26.6 pg (ref 25.1–34.0)
MCHC: 32.3 g/dL (ref 31.5–36.0)
MCV: 82.3 fL (ref 79.5–101.0)
MONOS PCT: 1 %
Monocytes Absolute: 0.1 10*3/uL (ref 0.1–0.9)
Neutro Abs: 11.6 10*3/uL — ABNORMAL HIGH (ref 1.5–6.5)
Neutrophils Relative %: 89 %
Platelets: 390 10*3/uL (ref 145–400)
RBC: 4.72 MIL/uL (ref 3.70–5.45)
RDW: 14.7 % — ABNORMAL HIGH (ref 11.2–14.5)
WBC: 12.9 10*3/uL — AB (ref 3.9–10.3)

## 2017-11-19 MED ORDER — DIPHENHYDRAMINE HCL 50 MG/ML IJ SOLN
INTRAMUSCULAR | Status: AC
  Filled 2017-11-19: qty 1

## 2017-11-19 MED ORDER — SODIUM CHLORIDE 0.9 % IV SOLN
Freq: Once | INTRAVENOUS | Status: AC
  Administered 2017-11-19: 10:00:00 via INTRAVENOUS
  Filled 2017-11-19: qty 250

## 2017-11-19 MED ORDER — FAMOTIDINE IN NACL 20-0.9 MG/50ML-% IV SOLN
INTRAVENOUS | Status: AC
  Filled 2017-11-19: qty 50

## 2017-11-19 MED ORDER — SODIUM CHLORIDE 0.9 % IV SOLN
750.0000 mg | Freq: Once | INTRAVENOUS | Status: AC
  Administered 2017-11-19: 750 mg via INTRAVENOUS
  Filled 2017-11-19: qty 75

## 2017-11-19 MED ORDER — PALONOSETRON HCL INJECTION 0.25 MG/5ML
INTRAVENOUS | Status: AC
  Filled 2017-11-19: qty 5

## 2017-11-19 MED ORDER — DIPHENHYDRAMINE HCL 50 MG/ML IJ SOLN
50.0000 mg | Freq: Once | INTRAMUSCULAR | Status: AC
  Administered 2017-11-19: 50 mg via INTRAVENOUS

## 2017-11-19 MED ORDER — HEPARIN SOD (PORK) LOCK FLUSH 100 UNIT/ML IV SOLN
500.0000 [IU] | Freq: Once | INTRAVENOUS | Status: AC | PRN
  Administered 2017-11-19: 500 [IU]
  Filled 2017-11-19: qty 5

## 2017-11-19 MED ORDER — SODIUM CHLORIDE 0.9 % IV SOLN
116.6667 mg/m2 | Freq: Once | INTRAVENOUS | Status: AC
  Administered 2017-11-19: 228 mg via INTRAVENOUS
  Filled 2017-11-19: qty 38

## 2017-11-19 MED ORDER — PALONOSETRON HCL INJECTION 0.25 MG/5ML
0.2500 mg | Freq: Once | INTRAVENOUS | Status: AC
  Administered 2017-11-19: 0.25 mg via INTRAVENOUS

## 2017-11-19 MED ORDER — SODIUM CHLORIDE 0.9% FLUSH
10.0000 mL | Freq: Once | INTRAVENOUS | Status: AC
  Administered 2017-11-19: 10 mL
  Filled 2017-11-19: qty 10

## 2017-11-19 MED ORDER — FAMOTIDINE IN NACL 20-0.9 MG/50ML-% IV SOLN
20.0000 mg | Freq: Once | INTRAVENOUS | Status: AC
  Administered 2017-11-19: 20 mg via INTRAVENOUS

## 2017-11-19 MED ORDER — SODIUM CHLORIDE 0.9% FLUSH
10.0000 mL | INTRAVENOUS | Status: DC | PRN
  Administered 2017-11-19: 10 mL
  Filled 2017-11-19: qty 10

## 2017-11-19 MED ORDER — SODIUM CHLORIDE 0.9 % IV SOLN
Freq: Once | INTRAVENOUS | Status: AC
  Administered 2017-11-19: 10:00:00 via INTRAVENOUS
  Filled 2017-11-19: qty 5

## 2017-11-19 NOTE — Telephone Encounter (Signed)
Gave pt avs and calendar  °

## 2017-11-19 NOTE — Progress Notes (Signed)
Per Dr. Alvy Bimler, okay to treat patient with pulse rate of 105.

## 2017-11-19 NOTE — Patient Instructions (Signed)
[facility] Discharge Instructions for Patients Receiving Chemotherapy  Today you received the following chemotherapy agents Paclitaxel (Taxol) & Carboplatin (Paraplatin)  To help prevent nausea and vomiting after your treatment, we encourage you to take your nausea medication as prescribed.  If you develop nausea and vomiting that is not controlled by your nausea medication, call the clinic.   BELOW ARE SYMPTOMS THAT SHOULD BE REPORTED IMMEDIATELY:  *FEVER GREATER THAN 100.5 F  *CHILLS WITH OR WITHOUT FEVER  NAUSEA AND VOMITING THAT IS NOT CONTROLLED WITH YOUR NAUSEA MEDICATION  *UNUSUAL SHORTNESS OF BREATH  *UNUSUAL BRUISING OR BLEEDING  TENDERNESS IN MOUTH AND THROAT WITH OR WITHOUT PRESENCE OF ULCERS  *URINARY PROBLEMS  *BOWEL PROBLEMS  UNUSUAL RASH Items with * indicate a potential emergency and should be followed up as soon as possible.  Feel free to call the clinic should you have any questions or concerns. The clinic phone number is [phone].  Please show the CHEMO ALERT CARD at check-in to the Emergency Department and triage nurse.   

## 2017-11-20 ENCOUNTER — Encounter: Payer: Self-pay | Admitting: Hematology and Oncology

## 2017-11-20 DIAGNOSIS — R110 Nausea: Secondary | ICD-10-CM | POA: Insufficient documentation

## 2017-11-20 DIAGNOSIS — G620 Drug-induced polyneuropathy: Secondary | ICD-10-CM | POA: Insufficient documentation

## 2017-11-20 DIAGNOSIS — R739 Hyperglycemia, unspecified: Secondary | ICD-10-CM | POA: Insufficient documentation

## 2017-11-20 DIAGNOSIS — T451X5A Adverse effect of antineoplastic and immunosuppressive drugs, initial encounter: Secondary | ICD-10-CM

## 2017-11-20 DIAGNOSIS — T380X5A Adverse effect of glucocorticoids and synthetic analogues, initial encounter: Secondary | ICD-10-CM

## 2017-11-20 DIAGNOSIS — M2550 Pain in unspecified joint: Secondary | ICD-10-CM | POA: Insufficient documentation

## 2017-11-20 LAB — CA 125: CANCER ANTIGEN (CA) 125: 15.5 U/mL (ref 0.0–38.1)

## 2017-11-20 NOTE — Assessment & Plan Note (Signed)
Recommend antiemetics as needed. 

## 2017-11-20 NOTE — Assessment & Plan Note (Signed)
she has mild peripheral neuropathy, likely related to side effects of treatment. °I plan to reduce the dose of treatment as outlined above.  °I explained to the patient the rationale of this strategy and reassured the patient it would not compromise the efficacy of treatment ° °

## 2017-11-20 NOTE — Progress Notes (Signed)
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 I plan to reduce the dose of treatment as outlined above.  I explained to the patient the rationale of this strategy and reassured the patient it would not compromise the efficacy of treatment   Arthralgia She has mild arthralgia likely due to side effects of Taxol We will reduce the dose of treatment as above She will continue over-the-counter analgesics only.   Chemotherapy-induced nausea Recommend antiemetics as needed.   No orders of the defined types were placed in this encounter.   INTERVAL HISTORY: Please see below for problem oriented charting. 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  During the ER visit an ultrasound performed which identified an adnexal mass on the left, with no signs of torsion.  She was then seen in the offices of  Dr. Barrie Dunker in June 2019 and a plan was made to proceed with surgical removal of the adnexa    08/23/2017 Surgery    On August 23, 2017 she was taken to the operating room for a laparoscopic left salpingo-oophorectomy.  Review of the operative note suggest that intraoperative findings were significant for a 6 cm left adnexal mass that was stuck in the cul-de-sac with some paraovarian adhesions noted.  It did not appear to be malignant in appearance to the surgeons at the time of surgery.  The upper abdomen, diaphragms, and omentum were commented on as looking normal.  There was no ascites.  The left tube and ovary was removed, however there was some fragmentation of the specimen during removal, and therefore the left tube and ovary were sent as 2 specimens.  The first specimen is labeled ovary biopsy for frozen section which revealed serous carcinoma and the second specimen is labeled left ovary on permanent this revealed serous carcinoma.  The operative note reports that the intraoperative diagnosis was borderline tumor, or reviewing the pathology report it states that the intraoperative diagnosis was left ovarian biopsy epithelial neoplasm at least borderline, cannot exclude carcinoma.  The frozen section of the second specimen was the same (epithelial neoplasm at least borderline cannot exclude carcinoma.).  The patient was seen back in the office by Dr. Barrie Dunker on September 06, 2017 and delivered her diagnosis of carcinoma.  At that time she was referred for consultation with gynecologic oncology.    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 No evidence of thoracic metastasis. 2. Small angular nodule in the RIGHT middle lobe is favored benign.  Abdomen / Pelvis Impression:  1. No evidence of local recurrence of ovarian carcinoma. 2. No evidence of metastatic disease in the abdomen pelvis. 3. No free fluid    09/25/2017 Tumor Marker     Patient's tumor was tested for the following markers: CA-125 Results of the tumor marker test revealed 66.4    10/02/2017 Pathology Results    1. Omentum, resection other than for tumor - NO CARCINOMA IDENTIFIED 2. Uterus +/- tubes/ovaries, neoplastic, right fallopian tube and ovary - CARCINOMA INVOLVING SEROSA OF THE UTERUS AND ENDOMETRIUM - NO EVIDENCE OF CARCINOMA IN THE CERVIX, RIGHT OVARY OR FALLOPIAN TUBE - LEIOMYOMATA (2 CM; LARGEST) - SEE COMMENT 3. Lymph nodes, regional resection, right pelvic - NO CARCINOMA IDENTIFIED IN NINE LYMPH NODES (0/9) 4. Lymph nodes, regional resection, left pelvic - METASTATIC CARCINOMA INVOLVING ONE OF FIVE NODES - SEE COMMENT Microscopic Comment 2. The left lateral aspect of the uterine serosa has carcinoma which appears endometrioid with foci of squamous differentiation; serous features are not seen. There are two focal areas of endometrioid carcinoma confined to the endometrium. Based on the size of the left ovarian lesion, left pelvic node involvement and minimal involvement of the endometrium, this is favored to be a primary ovarian with uterine involvement. However, given the different histologic subtyping of these lesions, it would be beneficial to review the current case in conjunction with the patient's recently diagnosed ovarian serous carcinoma for accurate staging. This case was discussed with Dr. Denman George on October 05, 2017. Dr. Lyndon Code reviewed the case and agrees with the above diagnosis. 4. Cytokeratin AE1/3 was utilized and highlights isolated tumor cells and a focus of tumor with similar histology as that seen on the serosal surface of the endometrium.    10/02/2017 Pathology Results    PERITONEAL WASHING(SPECIMEN 1 OF 1 COLLECTED 10/02/17): ATYPICAL CELLS. SEE COMMENT.    10/02/2017 Surgery    Surgeon: Donaciano Eva   Operation: Robotic-assisted laparoscopic total hysterectomy with bilateral salpingoophorectomy, omentectomy, bilateral  pelvic lymphadenectomy  Surgeon: Donaciano Eva  Operative Findings:  : 6cm grossly normal uterus (with pedunculated 1cm fibroid), normal appearing right tube and ovary. There was a thin layer of apparent residual left ovary adherent to the uterus/left ovarian fossa (completely resected with this surgery). No ascites, normal appendix and small bowel. Normal appearing omentum. No suspicious nodes.  No residual visible cancer remaining at the completion of the surgery.          Specimens: washings, omentum, uterus with cervix and right tube and ovary, right and left pelvic lymph nodes.     10/16/2017 Cancer Staging    Staging form: Ovary, Fallopian Tube, and Primary Peritoneal Carcinoma, AJCC 8th Edition - Pathologic: FIGO Stage III (pT3, pN1, cM0) - Signed by Heath Lark, MD on 10/17/2017    10/18/2017 Genetic Testing    Negative genetic testing on the [facility] panel.  The [facility] gene panel offered by [employer] includes sequencing and deletion/duplication testing of the following 35 genes: APC, ATM, AXIN2, BARD1, BMPR1A, BRCA1, BRCA2, BRIP1, CHD1, CDK4, CDKN2A, CHEK2, EPCAM (large rearrangement only), HOXB13, (sequencing only), GALNT12, MLH1, MSH2, MSH3 (excluding repetitive portions of exon 1), MSH6, MUTYH, NBN, NTHL1, PALB2, PMS2, PTEN, RAD51C, RAD51D, RNF43, RPS20, SMAD4, STK11, and TP53. Sequencing was performed for select regions of POLE and POLD1, and large rearrangement analysis was performed for select regions of GREM1. The  report date is October 18, 2017.    10/24/2017 Procedure    Ultrasound and fluoroscopically guided right internal jugular single lumen power port catheter insertion. Tip in the SVC/RA junction. Catheter ready for use.    10/25/2017 Tumor Marker    Patient's tumor was tested for the following markers: CA-125 Results of the tumor marker test revealed 24.2    11/19/2017 Tumor Marker    Patient's tumor was tested for the following markers:  CA-125 Results of the tumor marker test revealed 15.5     REVIEW OF SYSTEMS:   Constitutional: Denies fevers, chills or abnormal weight loss Eyes: Denies blurriness of vision Ears, nose, mouth, throat, and face: Denies mucositis or sore throat Respiratory: Denies cough, dyspnea or wheezes Cardiovascular: Denies palpitation, chest discomfort or lower extremity swelling Skin: Denies abnormal skin rashes Lymphatics: Denies new lymphadenopathy or easy bruising Behavioral/Psych: Mood is stable, no new changes  All other systems were reviewed with the patient and are negative.  I have reviewed the past medical history, past surgical history, social history and family history with the patient and they are unchanged from previous note.  ALLERGIES:  is allergic to other.  MEDICATIONS:  Current Outpatient Medications  Medication Sig Dispense Refill  . acetaminophen (TYLENOL) 500 MG tablet Take 2 tablets (1,000 mg total) by mouth every 6 (six) hours. 30 tablet 0  . dexamethasone (DECADRON) 4 MG tablet Take 5 tabs at the night before and 5 tab the morning of chemotherapy, every 3 weeks, by mouth 60 tablet 0  . estrogens, conjugated, (PREMARIN) 0.625 MG tablet Take 1 tablet (0.625 mg total) by mouth daily. Take daily for 21 days then do not take for 7 days. 30 tablet 11  . ibuprofen (ADVIL,MOTRIN) 200 MG tablet Take 200 mg by mouth daily as needed for headache or moderate pain.    Marland Kitchen lidocaine-prilocaine (EMLA) cream Apply to affected area once 30 g 3  . lisinopril (PRINIVIL,ZESTRIL) 10 MG tablet Take 10 mg by mouth daily.   1  . mometasone (ELOCON) 0.1 % cream Apply 1 application topically 2 (two) times daily as needed for rash.  12  . ondansetron (ZOFRAN) 8 MG tablet Take 1 tablet (8 mg total) by mouth every 8 (eight) hours as needed for refractory nausea / vomiting. Start on day 3 after chemo. 30 tablet 1  . oxyCODONE (OXY IR/ROXICODONE) 5 MG immediate release tablet Take 1 tablet (5 mg total) by  mouth every 4 (four) hours as needed for severe pain or breakthrough pain. 30 tablet 0  . prochlorperazine (COMPAZINE) 10 MG tablet Take 1 tablet (10 mg total) by mouth every 6 (six) hours as needed (Nausea or vomiting). 30 tablet 1  . senna-docusate (SENOKOT-S) 8.6-50 MG tablet Take 2 tablets by mouth at bedtime. 30 tablet 0   No current facility-administered medications for this visit.     PHYSICAL EXAMINATION: ECOG PERFORMANCE STATUS: 1 - Symptomatic but completely ambulatory  Vitals:   11/19/17 0858  BP: 131/76  Pulse: (!) 107  Resp: 18  Temp: 97.7 F (36.5 C)  SpO2: 100%   Filed Weights   11/19/17 0858  Weight: 182 lb 9.6 oz (82.8 kg)    GENERAL:alert, no distress and comfortable SKIN: skin color, texture, turgor are normal, no rashes or significant lesions EYES: normal, Conjunctiva are pink and non-injected, sclera clear OROPHARYNX:no exudate, no erythema and lips, buccal mucosa, and tongue normal  NECK: supple, thyroid normal size, non-tender, without nodularity LYMPH:  no palpable lymphadenopathy in  the cervical, axillary or inguinal LUNGS: clear to auscultation and percussion with normal breathing effort HEART: regular rate & rhythm and no murmurs and no lower extremity edema ABDOMEN:abdomen soft, non-tender and normal bowel sounds Musculoskeletal:no cyanosis of digits and no clubbing  NEURO: alert & oriented x 3 with fluent speech, no focal motor/sensory deficits  LABORATORY DATA:  I have reviewed the data as listed    Component Value Date/Time   NA 140 11/19/2017 0823   K 3.8 11/19/2017 0823   CL 106 11/19/2017 0823   CO2 21 (L) 11/19/2017 0823   GLUCOSE 280 (H) 11/19/2017 0823   BUN 11 11/19/2017 0823   CREATININE 0.77 11/19/2017 0823   CREATININE 0.67 09/18/2017 1234   CALCIUM 10.4 (H) 11/19/2017 0823   PROT 8.0 11/19/2017 0823   ALBUMIN 4.1 11/19/2017 0823   AST 29 11/19/2017 0823   ALT 38 11/19/2017 0823   ALKPHOS 98 11/19/2017 0823   BILITOT 0.2  (L) 11/19/2017 0823   GFRNONAA >60 11/19/2017 0823   GFRNONAA >60 09/18/2017 1234   GFRAA >60 11/19/2017 0823   GFRAA >60 09/18/2017 1234    No results found for: SPEP, UPEP  Lab Results  Component Value Date   WBC 12.9 (H) 11/19/2017   NEUTROABS 11.6 (H) 11/19/2017   HGB 12.6 11/19/2017   HCT 38.8 11/19/2017   MCV 82.3 11/19/2017   PLT 390 11/19/2017      Chemistry      Component Value Date/Time   NA 140 11/19/2017 0823   K 3.8 11/19/2017 0823   CL 106 11/19/2017 0823   CO2 21 (L) 11/19/2017 0823   BUN 11 11/19/2017 0823   CREATININE 0.77 11/19/2017 0823   CREATININE 0.67 09/18/2017 1234      Component Value Date/Time   CALCIUM 10.4 (H) 11/19/2017 0823   ALKPHOS 98 11/19/2017 0823   AST 29 11/19/2017 0823   ALT 38 11/19/2017 0823   BILITOT 0.2 (L) 11/19/2017 0823       RADIOGRAPHIC STUDIES: I have personally reviewed the radiological images as listed and agreed with the findings in the report. Ir Imaging Guided Port Insertion  Result Date: 10/24/2017 CLINICAL DATA:  Ovarian CA, access for chemotherapy EXAM: RIGHT INTERNAL JUGULAR SINGLE LUMEN POWER PORT CATHETER INSERTION Date:  10/24/2017 10/24/2017 4:05 pm Radiologist:  Jerilynn Mages. Daryll Brod, MD Guidance:  Ultrasound and fluoroscopic MEDICATIONS: Ancef 2 g; The antibiotic was administered within an appropriate time interval prior to skin puncture. ANESTHESIA/SEDATION: Versed 1.0 mg IV; Fentanyl 100 mcg IV; Moderate Sedation Time:  20 minutes The patient was continuously monitored during the procedure by the interventional radiology nurse under my direct supervision. FLUOROSCOPY TIME:  0 minutes, 18 seconds (5.6 mGy) COMPLICATIONS: None immediate. CONTRAST:  None PROCEDURE: Informed consent was obtained from the patient following explanation of the procedure, risks, benefits and alternatives. The patient understands, agrees and consents for the procedure. All questions were addressed. A time out was performed. Maximal barrier  sterile technique utilized including caps, mask, sterile gowns, sterile gloves, large sterile drape, hand hygiene, and 2% chlorhexidine scrub. Under sterile conditions and local anesthesia, right internal jugular micropuncture venous access was performed. Access was performed with ultrasound. Images were obtained for documentation of the patent right internal jugular vein. A guide wire was inserted followed by a transitional dilator. This allowed insertion of a guide wire and catheter into the IVC. Measurements were obtained from the SVC / RA junction back to the right IJ venotomy site. In the right infraclavicular  chest, a subcutaneous pocket was created over the second anterior rib. This was done under sterile conditions and local anesthesia. 1% lidocaine with epinephrine was utilized for this. A 2.5 cm incision was made in the skin. Blunt dissection was performed to create a subcutaneous pocket over the right pectoralis major muscle. The pocket was flushed with saline vigorously. There was adequate hemostasis. The port catheter was assembled and checked for leakage. The port catheter was secured in the pocket with two retention sutures. The tubing was tunneled subcutaneously to the right venotomy site and inserted into the SVC/RA junction through a valved peel-away sheath. Position was confirmed with fluoroscopy. Images were obtained for documentation. The patient tolerated the procedure well. No immediate complications. Incisions were closed in a two layer fashion with 4 - 0 Vicryl suture. Dermabond was applied to the skin. The port catheter was accessed, blood was aspirated followed by saline and heparin flushes. Needle was removed. A dry sterile dressing was applied. IMPRESSION: Ultrasound and fluoroscopically guided right internal jugular single lumen power port catheter insertion. Tip in the SVC/RA junction. Catheter ready for use. Electronically Signed   By: Jerilynn Mages.  Shick M.D.   On: 10/24/2017 16:10    All  questions were answered. The patient knows to call the clinic with any problems, questions or concerns. No barriers to learning was detected.  I spent 25 minutes counseling the patient face to face. The total time spent in the appointment was 30 minutes and more than 50% was on counseling and review of test results  Heath Lark, MD 11/20/2017 8:38 AM

## 2017-11-20 NOTE — Assessment & Plan Note (Signed)
She has significant steroid-induced hyperglycemia Plan to reduce oral dose of dexamethasone a little bit

## 2017-11-20 NOTE — Assessment & Plan Note (Signed)
She tolerated treatment well except for some nausea, bony achiness and neuropathy I plan to reduce the dose of Taxol further

## 2017-11-20 NOTE — Assessment & Plan Note (Signed)
She has mild arthralgia likely due to side effects of Taxol We will reduce the dose of treatment as above She will continue over-the-counter analgesics only.

## 2017-12-10 ENCOUNTER — Inpatient Hospital Stay (HOSPITAL_BASED_OUTPATIENT_CLINIC_OR_DEPARTMENT_OTHER): Payer: Self-pay | Admitting: Hematology and Oncology

## 2017-12-10 ENCOUNTER — Encounter: Payer: Self-pay | Admitting: Hematology and Oncology

## 2017-12-10 ENCOUNTER — Inpatient Hospital Stay: Payer: Self-pay

## 2017-12-10 ENCOUNTER — Inpatient Hospital Stay: Payer: Self-pay | Attending: Gynecologic Oncology

## 2017-12-10 ENCOUNTER — Telehealth: Payer: Self-pay

## 2017-12-10 VITALS — HR 99

## 2017-12-10 DIAGNOSIS — C562 Malignant neoplasm of left ovary: Secondary | ICD-10-CM

## 2017-12-10 DIAGNOSIS — G620 Drug-induced polyneuropathy: Secondary | ICD-10-CM

## 2017-12-10 DIAGNOSIS — T380X5A Adverse effect of glucocorticoids and synthetic analogues, initial encounter: Secondary | ICD-10-CM

## 2017-12-10 DIAGNOSIS — T451X5A Adverse effect of antineoplastic and immunosuppressive drugs, initial encounter: Secondary | ICD-10-CM

## 2017-12-10 DIAGNOSIS — Z79899 Other long term (current) drug therapy: Secondary | ICD-10-CM | POA: Insufficient documentation

## 2017-12-10 DIAGNOSIS — Z5111 Encounter for antineoplastic chemotherapy: Secondary | ICD-10-CM | POA: Insufficient documentation

## 2017-12-10 DIAGNOSIS — R748 Abnormal levels of other serum enzymes: Secondary | ICD-10-CM

## 2017-12-10 DIAGNOSIS — R300 Dysuria: Secondary | ICD-10-CM | POA: Insufficient documentation

## 2017-12-10 DIAGNOSIS — R739 Hyperglycemia, unspecified: Secondary | ICD-10-CM | POA: Insufficient documentation

## 2017-12-10 LAB — CBC WITH DIFFERENTIAL/PLATELET
ABS IMMATURE GRANULOCYTES: 0.07 10*3/uL (ref 0.00–0.07)
BASOS ABS: 0 10*3/uL (ref 0.0–0.1)
BASOS PCT: 0 %
EOS ABS: 0 10*3/uL (ref 0.0–0.5)
Eosinophils Relative: 0 %
HCT: 38.8 % (ref 36.0–46.0)
Hemoglobin: 12.8 g/dL (ref 12.0–15.0)
IMMATURE GRANULOCYTES: 1 %
Lymphocytes Relative: 10 %
Lymphs Abs: 1.1 10*3/uL (ref 0.7–4.0)
MCH: 27.3 pg (ref 26.0–34.0)
MCHC: 33 g/dL (ref 30.0–36.0)
MCV: 82.7 fL (ref 80.0–100.0)
MONOS PCT: 1 %
Monocytes Absolute: 0.1 10*3/uL (ref 0.1–1.0)
NEUTROS ABS: 9.4 10*3/uL — AB (ref 1.7–7.7)
NEUTROS PCT: 88 %
NRBC: 0 % (ref 0.0–0.2)
PLATELETS: 452 10*3/uL — AB (ref 150–400)
RBC: 4.69 MIL/uL (ref 3.87–5.11)
RDW: 14.5 % (ref 11.5–15.5)
WBC: 10.6 10*3/uL — ABNORMAL HIGH (ref 4.0–10.5)

## 2017-12-10 LAB — URINALYSIS, COMPLETE (UACMP) WITH MICROSCOPIC
Bilirubin Urine: NEGATIVE
Glucose, UA: 150 mg/dL — AB
KETONES UR: NEGATIVE mg/dL
Leukocytes, UA: NEGATIVE
Nitrite: NEGATIVE
Protein, ur: NEGATIVE mg/dL
Specific Gravity, Urine: 1.011 (ref 1.005–1.030)
pH: 7 (ref 5.0–8.0)

## 2017-12-10 LAB — COMPREHENSIVE METABOLIC PANEL
ALT: 47 U/L — ABNORMAL HIGH (ref 0–44)
AST: 46 U/L — AB (ref 15–41)
Albumin: 4.2 g/dL (ref 3.5–5.0)
Alkaline Phosphatase: 80 U/L (ref 38–126)
Anion gap: 12 (ref 5–15)
BUN: 12 mg/dL (ref 6–20)
CHLORIDE: 106 mmol/L (ref 98–111)
CO2: 24 mmol/L (ref 22–32)
Calcium: 10.1 mg/dL (ref 8.9–10.3)
Creatinine, Ser: 0.62 mg/dL (ref 0.44–1.00)
GFR calc Af Amer: >60 mL/min (ref >60)
GFR calc non Af Amer: >60 mL/min (ref >60)
Glucose, Bld: 263 mg/dL — ABNORMAL HIGH (ref 70–99)
POTASSIUM: 3.9 mmol/L (ref 3.5–5.1)
SODIUM: 142 mmol/L (ref 135–145)
Total Bilirubin: 0.2 mg/dL — ABNORMAL LOW (ref 0.3–1.2)
Total Protein: 7.8 g/dL (ref 6.5–8.1)

## 2017-12-10 MED ORDER — HEPARIN SOD (PORK) LOCK FLUSH 100 UNIT/ML IV SOLN
500.0000 [IU] | Freq: Once | INTRAVENOUS | Status: AC | PRN
  Administered 2017-12-10: 500 [IU]
  Filled 2017-12-10: qty 5

## 2017-12-10 MED ORDER — SODIUM CHLORIDE 0.9 % IV SOLN
Freq: Once | INTRAVENOUS | Status: AC
  Administered 2017-12-10: 09:00:00 via INTRAVENOUS
  Filled 2017-12-10: qty 250

## 2017-12-10 MED ORDER — PALONOSETRON HCL INJECTION 0.25 MG/5ML
0.2500 mg | Freq: Once | INTRAVENOUS | Status: AC
  Administered 2017-12-10: 0.25 mg via INTRAVENOUS

## 2017-12-10 MED ORDER — SODIUM CHLORIDE 0.9 % IV SOLN
750.0000 mg | Freq: Once | INTRAVENOUS | Status: AC
  Administered 2017-12-10: 750 mg via INTRAVENOUS
  Filled 2017-12-10: qty 75

## 2017-12-10 MED ORDER — SODIUM CHLORIDE 0.9 % IV SOLN
Freq: Once | INTRAVENOUS | Status: AC
  Administered 2017-12-10: 10:00:00 via INTRAVENOUS
  Filled 2017-12-10: qty 5

## 2017-12-10 MED ORDER — FAMOTIDINE IN NACL 20-0.9 MG/50ML-% IV SOLN
INTRAVENOUS | Status: AC
  Filled 2017-12-10: qty 50

## 2017-12-10 MED ORDER — FAMOTIDINE IN NACL 20-0.9 MG/50ML-% IV SOLN
20.0000 mg | Freq: Once | INTRAVENOUS | Status: AC
  Administered 2017-12-10: 20 mg via INTRAVENOUS

## 2017-12-10 MED ORDER — SODIUM CHLORIDE 0.9% FLUSH
10.0000 mL | Freq: Once | INTRAVENOUS | Status: AC
  Administered 2017-12-10: 10 mL
  Filled 2017-12-10: qty 10

## 2017-12-10 MED ORDER — SODIUM CHLORIDE 0.9% FLUSH
10.0000 mL | INTRAVENOUS | Status: DC | PRN
  Administered 2017-12-10: 10 mL
  Filled 2017-12-10: qty 10

## 2017-12-10 MED ORDER — DIPHENHYDRAMINE HCL 50 MG/ML IJ SOLN
INTRAMUSCULAR | Status: AC
  Filled 2017-12-10: qty 1

## 2017-12-10 MED ORDER — PALONOSETRON HCL INJECTION 0.25 MG/5ML
INTRAVENOUS | Status: AC
  Filled 2017-12-10: qty 5

## 2017-12-10 MED ORDER — SODIUM CHLORIDE 0.9 % IV SOLN
116.6667 mg/m2 | Freq: Once | INTRAVENOUS | Status: AC
  Administered 2017-12-10: 228 mg via INTRAVENOUS
  Filled 2017-12-10: qty 38

## 2017-12-10 MED ORDER — DIPHENHYDRAMINE HCL 50 MG/ML IJ SOLN
50.0000 mg | Freq: Once | INTRAMUSCULAR | Status: AC
  Administered 2017-12-10: 50 mg via INTRAVENOUS

## 2017-12-10 NOTE — Telephone Encounter (Signed)
Given below message in person in the infusion room. She verbalized understanding.

## 2017-12-10 NOTE — Assessment & Plan Note (Signed)
She has significant steroid-induced hyperglycemia Plan to reduce oral dose of dexamethasone a little bit

## 2017-12-10 NOTE — Assessment & Plan Note (Signed)
She complained of mild dysuria I will order urinalysis and urine culture to exclude possibility of urinary tract infection We will call the patient with test results I encouraged her to increase oral fluid as tolerated

## 2017-12-10 NOTE — Patient Instructions (Signed)
[facility] Discharge Instructions for Patients Receiving Chemotherapy  Today you received the following chemotherapy agents Paclitaxel (Taxol) & Carboplatin (Paraplatin)  To help prevent nausea and vomiting after your treatment, we encourage you to take your nausea medication as prescribed.  If you develop nausea and vomiting that is not controlled by your nausea medication, call the clinic.   BELOW ARE SYMPTOMS THAT SHOULD BE REPORTED IMMEDIATELY:  *FEVER GREATER THAN 100.5 F  *CHILLS WITH OR WITHOUT FEVER  NAUSEA AND VOMITING THAT IS NOT CONTROLLED WITH YOUR NAUSEA MEDICATION  *UNUSUAL SHORTNESS OF BREATH  *UNUSUAL BRUISING OR BLEEDING  TENDERNESS IN MOUTH AND THROAT WITH OR WITHOUT PRESENCE OF ULCERS  *URINARY PROBLEMS  *BOWEL PROBLEMS  UNUSUAL RASH Items with * indicate a potential emergency and should be followed up as soon as possible.  Feel free to call the clinic should you have any questions or concerns. The clinic phone number is [phone].  Please show the CHEMO ALERT CARD at check-in to the Emergency Department and triage nurse.   

## 2017-12-10 NOTE — Assessment & Plan Note (Signed)
She has intermittent elevated liver enzymes likely due to fatty liver We will continue to proceed with treatment with minor dose adjustment

## 2017-12-10 NOTE — Telephone Encounter (Signed)
-----   Message from Heath Lark, MD sent at 12/10/2017  9:49 AM EDT ----- Regarding: labs OK to proceed with mildly evelated LFT Tell patient to reduce oral dex at home to 8 mg (2 tabs) the night before and the morning of treatment ----- Message ----- From: Interface, Lab In [place] Sent: 12/10/2017   8:16 AM EDT To: Heath Lark, MD

## 2017-12-10 NOTE — Progress Notes (Signed)
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 [facility]) She tolerated treatment very well except for mild intermittent neuropathy, severe hyperglycemia and some nonspecific arthralgia We discussed importance of dietary modification Plan to reduce oral dexamethasone to 8 mg the night before and 8 mg the morning of treatment We will continue with minor dose adjustment of treatment  Steroid-induced hyperglycemia She has significant steroid-induced hyperglycemia Plan to reduce oral dose of dexamethasone a little bit  Peripheral neuropathy due to chemotherapy [facility]) This has been stable since dose adjustment.  We will continue reduced dose  Elevated liver enzymes She has intermittent elevated liver enzymes likely due to fatty liver We will continue to proceed with treatment with minor dose adjustment  Dysuria She complained of mild dysuria I will order urinalysis and urine culture to exclude possibility of urinary tract infection We will call the patient with test results I encouraged her to increase oral fluid as tolerated   Orders Placed This Encounter  Procedures  . Urine Culture    Standing Status:   Future    Number of Occurrences:   1    Standing Expiration Date:   01/14/2019  . Urinalysis, Complete w Microscopic    Standing Status:   Future    Number of Occurrences:   1    Standing Expiration Date:   12/11/2018    INTERVAL HISTORY: Please see below for problem oriented charting. 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  During the ER visit an ultrasound performed which identified an adnexal mass on the left, with no signs of torsion.  She was then seen in the offices of Dr. Barrie Dunker in June 2019 and a plan was made to proceed with surgical removal of the adnexa    08/23/2017 Surgery    On August 23, 2017 she was taken to the operating room for a laparoscopic left salpingo-oophorectomy.  Review of the operative note suggest that intraoperative findings were significant for a 6 cm left adnexal mass that was stuck in the cul-de-sac with some paraovarian adhesions noted.  It did not appear to be malignant in appearance to the surgeons at the time of surgery.  The upper abdomen, diaphragms, and omentum were commented on as looking normal.  There was no ascites.  The left tube and ovary was removed, however there was some fragmentation of the specimen during removal, and therefore the left tube and ovary were sent as 2 specimens.  The first specimen is labeled ovary biopsy for frozen section which revealed serous carcinoma and the second specimen is labeled left ovary on permanent this revealed serous carcinoma.  The operative note reports that the intraoperative diagnosis was borderline tumor, or reviewing the pathology report it states that the intraoperative diagnosis was left ovarian biopsy epithelial neoplasm at least borderline,  cannot exclude carcinoma.  The frozen section of the second specimen was the same (epithelial neoplasm at least borderline cannot exclude carcinoma.).  The patient was seen back in the office by Dr. Barrie Dunker on September 06, 2017 and delivered her diagnosis of carcinoma.  At that time she was referred for consultation with gynecologic oncology.    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 No evidence of thoracic metastasis. 2. Small angular nodule in the RIGHT middle lobe is favored benign.  Abdomen / Pelvis Impression:  1. No evidence of local recurrence of ovarian carcinoma. 2. No evidence of metastatic disease in the abdomen pelvis. 3. No free fluid    09/25/2017 Tumor Marker    Patient's tumor was tested for the following markers: CA-125 Results of the tumor marker test revealed 66.4    10/02/2017 Pathology Results    1. Omentum, resection other than for tumor - NO CARCINOMA IDENTIFIED 2. Uterus +/- tubes/ovaries, neoplastic, right fallopian tube and ovary - CARCINOMA INVOLVING SEROSA OF THE UTERUS AND ENDOMETRIUM - NO EVIDENCE OF CARCINOMA IN THE CERVIX, RIGHT OVARY OR FALLOPIAN TUBE - LEIOMYOMATA (2 CM; LARGEST) - SEE COMMENT 3. Lymph nodes, regional resection, right pelvic - NO CARCINOMA IDENTIFIED IN NINE LYMPH NODES (0/9) 4. Lymph nodes, regional resection, left pelvic - METASTATIC CARCINOMA INVOLVING ONE OF FIVE NODES - SEE COMMENT Microscopic Comment 2. The left lateral aspect of the uterine serosa has carcinoma which appears endometrioid with foci of squamous differentiation; serous features are not seen. There are two focal areas of endometrioid carcinoma confined to the endometrium. Based on the size of the left ovarian lesion, left pelvic node involvement and minimal involvement of the endometrium, this is favored to be a primary ovarian with uterine involvement. However, given the different histologic subtyping of these lesions, it would be beneficial to review the current case in conjunction with the patient's recently diagnosed ovarian serous carcinoma for accurate staging. This case was discussed with Dr. Denman George on October 05, 2017. Dr. Lyndon Code reviewed the case and agrees with the above diagnosis. 4. Cytokeratin AE1/3 was utilized and highlights isolated  tumor cells and a focus of tumor with similar histology as that seen on the serosal surface of the endometrium.    10/02/2017 Pathology Results    PERITONEAL WASHING(SPECIMEN 1 OF 1 COLLECTED 10/02/17): ATYPICAL CELLS. SEE COMMENT.    10/02/2017 Surgery    Surgeon: Donaciano Eva   Operation: Robotic-assisted laparoscopic total hysterectomy with bilateral salpingoophorectomy, omentectomy, bilateral pelvic lymphadenectomy  Surgeon: Donaciano Eva  Operative Findings:  : 6cm grossly normal uterus (with pedunculated 1cm fibroid), normal appearing right tube and ovary. There was a thin layer of apparent residual left ovary adherent to the uterus/left ovarian fossa (completely resected with this surgery). No ascites, normal appendix and small bowel. Normal appearing omentum. No suspicious nodes.  No residual visible cancer remaining at the completion of the surgery.          Specimens: washings, omentum, uterus with cervix and right tube and ovary, right and left pelvic lymph nodes.     10/16/2017 Cancer Staging    Staging form: Ovary, Fallopian Tube, and Primary Peritoneal Carcinoma, AJCC 8th Edition - Pathologic: FIGO Stage III (pT3, pN1, cM0) - Signed by Heath Lark, MD on 10/17/2017    10/18/2017 Genetic Testing    Negative genetic testing on the [facility] panel.  The [facility] gene panel  offered by [employer] includes sequencing and deletion/duplication testing of the following 35 genes: APC, ATM, AXIN2, BARD1, BMPR1A, BRCA1, BRCA2, BRIP1, CHD1, CDK4, CDKN2A, CHEK2, EPCAM (large rearrangement only), HOXB13, (sequencing only), GALNT12, MLH1, MSH2, MSH3 (excluding repetitive portions of exon 1), MSH6, MUTYH, NBN, NTHL1, PALB2, PMS2, PTEN, RAD51C, RAD51D, RNF43, RPS20, SMAD4, STK11, and TP53. Sequencing was performed for select regions of POLE and POLD1, and large rearrangement analysis was performed for select regions of GREM1. The report date is October 18, 2017.     10/24/2017 Procedure    Ultrasound and fluoroscopically guided right internal jugular single lumen power port catheter insertion. Tip in the SVC/RA junction. Catheter ready for use.    10/25/2017 Tumor Marker    Patient's tumor was tested for the following markers: CA-125 Results of the tumor marker test revealed 24.2    11/19/2017 Tumor Marker    Patient's tumor was tested for the following markers: CA-125 Results of the tumor marker test revealed 15.5     REVIEW OF SYSTEMS:   Constitutional: Denies fevers, chills or abnormal weight loss Eyes: Denies blurriness of vision Ears, nose, mouth, throat, and face: Denies mucositis or sore throat Respiratory: Denies cough, dyspnea or wheezes Cardiovascular: Denies palpitation, chest discomfort or lower extremity swelling Gastrointestinal:  Denies nausea, heartburn or change in bowel habits Skin: Denies abnormal skin rashes Lymphatics: Denies new lymphadenopathy or easy bruising Behavioral/Psych: Mood is stable, no new changes  All other systems were reviewed with the patient and are negative.  I have reviewed the past medical history, past surgical history, social history and family history with the patient and they are unchanged from previous note.  ALLERGIES:  is allergic to other.  MEDICATIONS:  Current Outpatient Medications  Medication Sig Dispense Refill  . acetaminophen (TYLENOL) 500 MG tablet Take 2 tablets (1,000 mg total) by mouth every 6 (six) hours. 30 tablet 0  . dexamethasone (DECADRON) 4 MG tablet Take 5 tabs at the night before and 5 tab the morning of chemotherapy, every 3 weeks, by mouth 60 tablet 0  . estrogens, conjugated, (PREMARIN) 0.625 MG tablet Take 1 tablet (0.625 mg total) by mouth daily. Take daily for 21 days then do not take for 7 days. 30 tablet 11  . ibuprofen (ADVIL,MOTRIN) 200 MG tablet Take 200 mg by mouth daily as needed for headache or moderate pain.    Marland Kitchen lidocaine-prilocaine (EMLA) cream Apply to  affected area once 30 g 3  . lisinopril (PRINIVIL,ZESTRIL) 10 MG tablet Take 10 mg by mouth daily.   1  . mometasone (ELOCON) 0.1 % cream Apply 1 application topically 2 (two) times daily as needed for rash.  12  . ondansetron (ZOFRAN) 8 MG tablet Take 1 tablet (8 mg total) by mouth every 8 (eight) hours as needed for refractory nausea / vomiting. Start on day 3 after chemo. 30 tablet 1  . oxyCODONE (OXY IR/ROXICODONE) 5 MG immediate release tablet Take 1 tablet (5 mg total) by mouth every 4 (four) hours as needed for severe pain or breakthrough pain. 30 tablet 0  . prochlorperazine (COMPAZINE) 10 MG tablet Take 1 tablet (10 mg total) by mouth every 6 (six) hours as needed (Nausea or vomiting). 30 tablet 1  . senna-docusate (SENOKOT-S) 8.6-50 MG tablet Take 2 tablets by mouth at bedtime. 30 tablet 0   No current facility-administered medications for this visit.    Facility-Administered Medications Ordered in Other Visits  Medication Dose Route Frequency Provider Last Rate Last Dose  .  CARBOplatin (PARAPLATIN) 750 mg in sodium chloride 0.9 % 250 mL chemo infusion  750 mg Intravenous Once Alvy Bimler, Gracious Renken, MD      . fosaprepitant (EMEND) 150 mg, dexamethasone (DECADRON) 12 mg in sodium chloride 0.9 % 145 mL IVPB   Intravenous Once Heath Lark, MD 454 mL/hr at 12/10/17 0953    . heparin lock flush 100 unit/mL  500 Units Intracatheter Once PRN Alvy Bimler, Chalise Pe, MD      . PACLitaxel (TAXOL) 228 mg in sodium chloride 0.9 % 250 mL chemo infusion (> '80mg'$ /m2)  353.6144 mg/m2 (Treatment Plan Recorded) Intravenous Once Alvy Bimler, Seraya Jobst, MD      . sodium chloride flush (NS) 0.9 % injection 10 mL  10 mL Intracatheter PRN Alvy Bimler, Landy Mace, MD        PHYSICAL EXAMINATION: ECOG PERFORMANCE STATUS: 1 - Symptomatic but completely ambulatory  Vitals:   12/10/17 0818  BP: 119/63  Pulse: (!) 111  Resp: 18  Temp: 97.9 F (36.6 C)  SpO2: 100%   Filed Weights   12/10/17 0818  Weight: 182 lb 6.4 oz (82.7 kg)     GENERAL:alert, no distress and comfortable SKIN: skin color, texture, turgor are normal, no rashes or significant lesions EYES: normal, Conjunctiva are pink and non-injected, sclera clear OROPHARYNX:no exudate, no erythema and lips, buccal mucosa, and tongue normal  NECK: supple, thyroid normal size, non-tender, without nodularity LYMPH:  no palpable lymphadenopathy in the cervical, axillary or inguinal LUNGS: clear to auscultation and percussion with normal breathing effort HEART: regular rate & rhythm and no murmurs and no lower extremity edema ABDOMEN:abdomen soft, non-tender and normal bowel sounds Musculoskeletal:no cyanosis of digits and no clubbing  NEURO: alert & oriented x 3 with fluent speech, no focal motor/sensory deficits  LABORATORY DATA:  I have reviewed the data as listed    Component Value Date/Time   NA 142 12/10/2017 0809   K 3.9 12/10/2017 0809   CL 106 12/10/2017 0809   CO2 24 12/10/2017 0809   GLUCOSE 263 (H) 12/10/2017 0809   BUN 12 12/10/2017 0809   CREATININE 0.62 12/10/2017 0809   CREATININE 0.67 09/18/2017 1234   CALCIUM 10.1 12/10/2017 0809   PROT 7.8 12/10/2017 0809   ALBUMIN 4.2 12/10/2017 0809   AST 46 (H) 12/10/2017 0809   ALT 47 (H) 12/10/2017 0809   ALKPHOS 80 12/10/2017 0809   BILITOT 0.2 (L) 12/10/2017 0809   GFRNONAA >60 12/10/2017 0809   GFRNONAA >60 09/18/2017 1234   GFRAA >60 12/10/2017 0809   GFRAA >60 09/18/2017 1234    No results found for: SPEP, UPEP  Lab Results  Component Value Date   WBC 10.6 (H) 12/10/2017   NEUTROABS 9.4 (H) 12/10/2017   HGB 12.8 12/10/2017   HCT 38.8 12/10/2017   MCV 82.7 12/10/2017   PLT 452 (H) 12/10/2017      Chemistry      Component Value Date/Time   NA 142 12/10/2017 0809   K 3.9 12/10/2017 0809   CL 106 12/10/2017 0809   CO2 24 12/10/2017 0809   BUN 12 12/10/2017 0809   CREATININE 0.62 12/10/2017 0809   CREATININE 0.67 09/18/2017 1234      Component Value Date/Time   CALCIUM  10.1 12/10/2017 0809   ALKPHOS 80 12/10/2017 0809   AST 46 (H) 12/10/2017 0809   ALT 47 (H) 12/10/2017 0809   BILITOT 0.2 (L) 12/10/2017 0809       All questions were answered. The patient knows to call the clinic with any  problems, questions or concerns. No barriers to learning was detected.  I spent 25 minutes counseling the patient face to face. The total time spent in the appointment was 30 minutes and more than 50% was on counseling and review of test results  Heath Lark, MD 12/10/2017 10:03 AM

## 2017-12-10 NOTE — Assessment & Plan Note (Signed)
She tolerated treatment very well except for mild intermittent neuropathy, severe hyperglycemia and some nonspecific arthralgia We discussed importance of dietary modification Plan to reduce oral dexamethasone to 8 mg the night before and 8 mg the morning of treatment We will continue with minor dose adjustment of treatment

## 2017-12-10 NOTE — Assessment & Plan Note (Signed)
This has been stable since dose adjustment.  We will continue reduced dose

## 2017-12-10 NOTE — Progress Notes (Signed)
Okay to release premeds while waiting on CMET, per Dr. Alvy Bimler.

## 2017-12-11 ENCOUNTER — Telehealth: Payer: Self-pay

## 2017-12-11 LAB — URINE CULTURE: Culture: NO GROWTH

## 2017-12-11 LAB — CA 125: Cancer Antigen (CA) 125: 14.7 U/mL (ref 0.0–38.1)

## 2017-12-11 NOTE — Telephone Encounter (Signed)
-----   Message from Heath Lark, MD sent at 12/11/2017  9:04 AM EDT ----- Regarding: urine culture neg Tell her the frequent urination is from high blood sugar, not infection pls drink more water ----- Message ----- From: Interface, [facility] [place] Sent: 12/10/2017   8:16 AM EDT To: Heath Lark, MD

## 2017-12-11 NOTE — Telephone Encounter (Signed)
Called and left below message, instructed to call office for questions.

## 2017-12-27 ENCOUNTER — Telehealth: Payer: Self-pay | Admitting: Hematology and Oncology

## 2017-12-27 NOTE — Telephone Encounter (Signed)
NG out 10/30 thru 11/29 - moved 11/4 f/u to RR - date/time remains the same. Left message for patient via [facility], [place]myra Ricks [phone].

## 2017-12-30 ENCOUNTER — Other Ambulatory Visit: Payer: Self-pay | Admitting: Hematology and Oncology

## 2017-12-30 DIAGNOSIS — Z5111 Encounter for antineoplastic chemotherapy: Secondary | ICD-10-CM

## 2017-12-30 DIAGNOSIS — C562 Malignant neoplasm of left ovary: Secondary | ICD-10-CM

## 2017-12-31 ENCOUNTER — Inpatient Hospital Stay: Payer: Self-pay

## 2017-12-31 ENCOUNTER — Encounter: Payer: Self-pay | Admitting: Hematology and Oncology

## 2017-12-31 ENCOUNTER — Inpatient Hospital Stay: Payer: Self-pay | Attending: Gynecologic Oncology

## 2017-12-31 ENCOUNTER — Inpatient Hospital Stay (HOSPITAL_BASED_OUTPATIENT_CLINIC_OR_DEPARTMENT_OTHER): Payer: Self-pay | Admitting: Hematology and Oncology

## 2017-12-31 VITALS — HR 93

## 2017-12-31 VITALS — BP 137/78 | HR 106 | Temp 97.8°F | Resp 18 | Ht 63.0 in | Wt 182.5 lb

## 2017-12-31 DIAGNOSIS — Z79899 Other long term (current) drug therapy: Secondary | ICD-10-CM | POA: Insufficient documentation

## 2017-12-31 DIAGNOSIS — C562 Malignant neoplasm of left ovary: Secondary | ICD-10-CM

## 2017-12-31 DIAGNOSIS — G620 Drug-induced polyneuropathy: Secondary | ICD-10-CM | POA: Insufficient documentation

## 2017-12-31 DIAGNOSIS — R739 Hyperglycemia, unspecified: Secondary | ICD-10-CM | POA: Insufficient documentation

## 2017-12-31 DIAGNOSIS — Z791 Long term (current) use of non-steroidal anti-inflammatories (NSAID): Secondary | ICD-10-CM | POA: Insufficient documentation

## 2017-12-31 DIAGNOSIS — R748 Abnormal levels of other serum enzymes: Secondary | ICD-10-CM | POA: Insufficient documentation

## 2017-12-31 DIAGNOSIS — R1011 Right upper quadrant pain: Secondary | ICD-10-CM | POA: Insufficient documentation

## 2017-12-31 DIAGNOSIS — Z5111 Encounter for antineoplastic chemotherapy: Secondary | ICD-10-CM | POA: Insufficient documentation

## 2017-12-31 DIAGNOSIS — R300 Dysuria: Secondary | ICD-10-CM

## 2017-12-31 DIAGNOSIS — K5900 Constipation, unspecified: Secondary | ICD-10-CM | POA: Insufficient documentation

## 2017-12-31 DIAGNOSIS — Z7952 Long term (current) use of systemic steroids: Secondary | ICD-10-CM | POA: Insufficient documentation

## 2017-12-31 LAB — CBC WITH DIFFERENTIAL (CANCER CENTER ONLY)
Abs Immature Granulocytes: 0.07 10*3/uL (ref 0.00–0.07)
BASOS ABS: 0 10*3/uL (ref 0.0–0.1)
BASOS PCT: 0 %
EOS ABS: 0 10*3/uL (ref 0.0–0.5)
Eosinophils Relative: 0 %
HCT: 40.2 % (ref 36.0–46.0)
Hemoglobin: 12.9 g/dL (ref 12.0–15.0)
IMMATURE GRANULOCYTES: 1 %
Lymphocytes Relative: 11 %
Lymphs Abs: 1 10*3/uL (ref 0.7–4.0)
MCH: 27.1 pg (ref 26.0–34.0)
MCHC: 32.1 g/dL (ref 30.0–36.0)
MCV: 84.5 fL (ref 80.0–100.0)
Monocytes Absolute: 0.1 10*3/uL (ref 0.1–1.0)
Monocytes Relative: 1 %
NEUTROS PCT: 87 %
NRBC: 0 % (ref 0.0–0.2)
Neutro Abs: 7.7 10*3/uL (ref 1.7–7.7)
PLATELETS: 344 10*3/uL (ref 150–400)
RBC: 4.76 MIL/uL (ref 3.87–5.11)
RDW: 14.9 % (ref 11.5–15.5)
WBC Count: 8.9 10*3/uL (ref 4.0–10.5)

## 2017-12-31 LAB — COMPREHENSIVE METABOLIC PANEL
ALK PHOS: 85 U/L (ref 38–126)
ALT: 46 U/L — ABNORMAL HIGH (ref 0–44)
ANION GAP: 12 (ref 5–15)
AST: 33 U/L (ref 15–41)
Albumin: 4.1 g/dL (ref 3.5–5.0)
BILIRUBIN TOTAL: 0.2 mg/dL — AB (ref 0.3–1.2)
BUN: 10 mg/dL (ref 6–20)
CALCIUM: 10.3 mg/dL (ref 8.9–10.3)
CO2: 23 mmol/L (ref 22–32)
Chloride: 106 mmol/L (ref 98–111)
Creatinine, Ser: 0.74 mg/dL (ref 0.44–1.00)
GFR calc non Af Amer: >60 mL/min (ref >60)
Glucose, Bld: 267 mg/dL — ABNORMAL HIGH (ref 70–99)
Potassium: 3.8 mmol/L (ref 3.5–5.1)
Sodium: 141 mmol/L (ref 135–145)
TOTAL PROTEIN: 7.7 g/dL (ref 6.5–8.1)

## 2017-12-31 MED ORDER — SODIUM CHLORIDE 0.9 % IV SOLN
750.0000 mg | Freq: Once | INTRAVENOUS | Status: AC
  Administered 2017-12-31: 750 mg via INTRAVENOUS
  Filled 2017-12-31: qty 75

## 2017-12-31 MED ORDER — SODIUM CHLORIDE 0.9 % IV SOLN
Freq: Once | INTRAVENOUS | Status: AC
  Administered 2017-12-31: 10:00:00 via INTRAVENOUS
  Filled 2017-12-31: qty 5

## 2017-12-31 MED ORDER — HEPARIN SOD (PORK) LOCK FLUSH 100 UNIT/ML IV SOLN
500.0000 [IU] | Freq: Once | INTRAVENOUS | Status: AC | PRN
  Administered 2017-12-31: 500 [IU]
  Filled 2017-12-31: qty 5

## 2017-12-31 MED ORDER — DIPHENHYDRAMINE HCL 50 MG/ML IJ SOLN
50.0000 mg | Freq: Once | INTRAMUSCULAR | Status: AC
  Administered 2017-12-31: 50 mg via INTRAVENOUS

## 2017-12-31 MED ORDER — SODIUM CHLORIDE 0.9 % IV SOLN
Freq: Once | INTRAVENOUS | Status: AC
  Administered 2017-12-31: 10:00:00 via INTRAVENOUS
  Filled 2017-12-31: qty 250

## 2017-12-31 MED ORDER — FAMOTIDINE IN NACL 20-0.9 MG/50ML-% IV SOLN
20.0000 mg | Freq: Once | INTRAVENOUS | Status: DC
  Filled 2017-12-31: qty 50

## 2017-12-31 MED ORDER — SODIUM CHLORIDE 0.9% FLUSH
10.0000 mL | Freq: Once | INTRAVENOUS | Status: AC
  Administered 2017-12-31: 10 mL
  Filled 2017-12-31: qty 10

## 2017-12-31 MED ORDER — PALONOSETRON HCL INJECTION 0.25 MG/5ML
INTRAVENOUS | Status: AC
  Filled 2017-12-31: qty 5

## 2017-12-31 MED ORDER — SODIUM CHLORIDE 0.9 % IV SOLN
116.6667 mg/m2 | Freq: Once | INTRAVENOUS | Status: AC
  Administered 2017-12-31: 228 mg via INTRAVENOUS
  Filled 2017-12-31: qty 38

## 2017-12-31 MED ORDER — SODIUM CHLORIDE 0.9% FLUSH
10.0000 mL | INTRAVENOUS | Status: DC | PRN
  Administered 2017-12-31: 10 mL
  Filled 2017-12-31: qty 10

## 2017-12-31 MED ORDER — DIPHENHYDRAMINE HCL 50 MG/ML IJ SOLN
INTRAMUSCULAR | Status: AC
  Filled 2017-12-31: qty 1

## 2017-12-31 MED ORDER — SODIUM CHLORIDE 0.9 % IV SOLN
20.0000 mg | Freq: Once | INTRAVENOUS | Status: AC
  Administered 2017-12-31: 20 mg via INTRAVENOUS
  Filled 2017-12-31: qty 2

## 2017-12-31 MED ORDER — PALONOSETRON HCL INJECTION 0.25 MG/5ML
0.2500 mg | Freq: Once | INTRAVENOUS | Status: AC
  Administered 2017-12-31: 0.25 mg via INTRAVENOUS

## 2017-12-31 NOTE — Progress Notes (Signed)
Hematology/Oncology Outpatient Progress Note December 31, 2017 1  Patient Care Team: Raiford Simmonds., PA-C as PCP - General  ASSESSMENT & PLAN:  Ovarian cancer on left [facility]) She tolerated treatment very well except for mild intermittent neuropathy, severe hyperglycemia and some nonspecific arthralgia At the time of her last visit, dexamethasone was decreased to 8 mg the night before and 8 mg the morning before treatment.  She has been compliant. We will continue with minor dose adjustment of treatment  Steroid-induced hyperglycemia She has significant steroid-induced hyperglycemia The glucose this morning was 267. We reemphasized the importance of dietary modification.  Peripheral neuropathy due to chemotherapy [facility]) This has been stable since dose adjustment.   We will continue reduced dose  Elevated liver enzymes She has intermittent elevated liver enzymes likely due to fatty liver We will continue to proceed with treatment with minor dose adjustment Her liver enzymes are stable today.  Dysuria She denies any urinary frequency, urgency, hematuria, or dysuria.   Her prior urine culture was negative.  She was encouraged to increase her oral fluid intake as tolerated.  INTERVAL HISTORY: Please see below for problem oriented charting. She returns for cycle 4 of treatment with paclitaxel and carboplatin. Following treatment she complains of mild nausea and headache over several days. For several days following treatment, she has arthralgias which dissipate over time. She denies any fever, sweats, shaking chills, or flulike symptoms. Both her appetite and weight remain stable.   She has chronic stable exertional dyspnea unchanged. She denies nausea, vomiting, diarrhea, or constipation. Her overall energy level remains stable. She continues to have intermittent numbness of her toes unchanged since her last visit.  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  During the ER visit an ultrasound performed which identified an adnexal mass on the left, with no signs of torsion.  She was then seen in the offices of Dr. Barrie Dunker in June 2019 and a plan was made to proceed with surgical removal of the adnexa    08/23/2017 Surgery    On August 23, 2017 she was taken to the operating room for a laparoscopic left salpingo-oophorectomy.  Review of the operative note suggest that intraoperative findings were significant for a 6 cm left adnexal mass that was stuck in the cul-de-sac with some paraovarian adhesions noted.  It did not appear to be malignant in appearance to the surgeons at the time of surgery.  The upper abdomen, diaphragms, and omentum were commented on as looking normal.  There was no ascites.  The left tube and ovary was removed, however there was some fragmentation of the specimen during removal, and therefore the left tube and ovary were sent as 2 specimens.  The first specimen is labeled ovary biopsy for frozen section which revealed serous carcinoma and the second specimen is labeled left ovary on permanent this revealed serous carcinoma.  The operative note reports that the intraoperative diagnosis was borderline tumor, or reviewing the pathology report it states that the intraoperative diagnosis was left ovarian biopsy epithelial neoplasm at least borderline, cannot exclude carcinoma.  The frozen section of the second specimen was the same (epithelial neoplasm at least borderline cannot exclude carcinoma.).  The  patient was seen back in the office by Dr. Barrie Dunker on September 06, 2017 and delivered her diagnosis of carcinoma.  At that time she was referred for consultation  with gynecologic oncology.    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 No evidence of thoracic metastasis. 2. Small angular nodule in the RIGHT middle lobe is favored benign.  Abdomen / Pelvis Impression:  1. No evidence of local recurrence of ovarian carcinoma. 2. No evidence of metastatic disease in the abdomen pelvis. 3. No free fluid    09/25/2017 Tumor Marker    Patient's tumor was tested for the following markers: CA-125 Results of the tumor marker test revealed 66.4    10/02/2017 Pathology Results    1. Omentum, resection other than for tumor - NO CARCINOMA IDENTIFIED 2. Uterus +/- tubes/ovaries, neoplastic, right fallopian tube and ovary - CARCINOMA INVOLVING SEROSA OF THE UTERUS AND ENDOMETRIUM - NO EVIDENCE OF CARCINOMA IN THE CERVIX, RIGHT OVARY OR FALLOPIAN TUBE - LEIOMYOMATA (2 CM; LARGEST) - SEE COMMENT 3. Lymph nodes, regional resection, right pelvic - NO CARCINOMA IDENTIFIED IN NINE LYMPH NODES (0/9) 4. Lymph nodes, regional resection, left pelvic - METASTATIC CARCINOMA INVOLVING ONE OF FIVE NODES - SEE COMMENT Microscopic Comment 2. The left lateral aspect of the uterine serosa has carcinoma which appears endometrioid with foci of squamous differentiation; serous features are not seen. There are two focal areas of endometrioid carcinoma confined to the endometrium. Based on the size of the left ovarian lesion, left pelvic node involvement and minimal involvement of the endometrium, this is favored to be a primary ovarian with uterine involvement. However, given the different histologic subtyping of these lesions, it would be beneficial to review the current case in conjunction with the patient's recently diagnosed ovarian serous carcinoma for accurate staging. This case was discussed with Dr. Denman George on October 05, 2017. Dr. Lyndon Code reviewed the case and agrees with the above diagnosis. 4. Cytokeratin  AE1/3 was utilized and highlights isolated tumor cells and a focus of tumor with similar histology as that seen on the serosal surface of the endometrium.    10/02/2017 Pathology Results    PERITONEAL WASHING(SPECIMEN 1 OF 1 COLLECTED 10/02/17): ATYPICAL CELLS. SEE COMMENT.    10/02/2017 Surgery    Surgeon: Donaciano Eva   Operation: Robotic-assisted laparoscopic total hysterectomy with bilateral salpingoophorectomy, omentectomy, bilateral pelvic lymphadenectomy  Surgeon: Donaciano Eva  Operative Findings:  : 6cm grossly normal uterus (with pedunculated 1cm fibroid), normal appearing right tube and ovary. There was a thin layer of apparent residual left ovary adherent to the uterus/left ovarian fossa (completely resected with this surgery). No ascites, normal appendix and small bowel. Normal appearing omentum. No suspicious nodes.  No residual visible cancer remaining at the completion of the surgery.          Specimens: washings, omentum, uterus with cervix and right tube and ovary, right and left pelvic lymph nodes.     10/16/2017 Cancer Staging    Staging form: Ovary, Fallopian Tube, and Primary Peritoneal Carcinoma, AJCC 8th Edition - Pathologic: FIGO Stage III (pT3, pN1, cM0) - Signed by Heath Lark, MD on 10/17/2017    10/18/2017 Genetic Testing    Negative genetic testing on the [facility] panel.  The [facility] gene panel offered by [employer] includes sequencing and deletion/duplication testing of the following 35 genes: APC, ATM, AXIN2, BARD1, BMPR1A, BRCA1, BRCA2, BRIP1, CHD1,  CDK4, CDKN2A, CHEK2, EPCAM (large rearrangement only), HOXB13, (sequencing only), GALNT12, MLH1, MSH2, MSH3 (excluding repetitive portions of exon 1), MSH6, MUTYH, NBN, NTHL1, PALB2, PMS2, PTEN, RAD51C, RAD51D, RNF43, RPS20, SMAD4, STK11, and TP53. Sequencing was performed for select regions of POLE and POLD1, and large rearrangement analysis was performed for select regions of GREM1. The  report date is October 18, 2017.    10/24/2017 Procedure    Ultrasound and fluoroscopically guided right internal jugular single lumen power port catheter insertion. Tip in the SVC/RA junction. Catheter ready for use.    10/25/2017 Tumor Marker    Patient's tumor was tested for the following markers: CA-125 Results of the tumor marker test revealed 24.2    11/19/2017 Tumor Marker    Patient's tumor was tested for the following markers: CA-125 Results of the tumor marker test revealed 15.5        12/31/2017                    Cycle 4 of paclitaxel and carboplatin                                         CA 125 14.7 (December 10, 2017)  REVIEW OF SYSTEMS:   Constitutional: Denies fevers, chills or abnormal weight loss Eyes: Denies blurriness of vision Ears, nose, mouth, throat, and face: Denies mucositis or sore throat Respiratory: Denies cough, dyspnea or wheezes Cardiovascular: Denies palpitation, chest discomfort or lower extremity swelling Gastrointestinal:  Denies nausea, heartburn or change in bowel habits Skin: Denies abnormal skin rashes Lymphatics: Denies new lymphadenopathy or easy bruising Behavioral/Psych: Mood is stable, no new changes  All other systems were reviewed with the patient and are negative.  Past Medical History Reviewed        Family History Reviewed        Social History Reviewed  ALLERGIES:  is allergic to other.   MEDICATIONS:  Current Outpatient Medications  Medication Sig Dispense Refill  . acetaminophen (TYLENOL) 500 MG tablet Take 2 tablets (1,000 mg total) by mouth every 6 (six) hours. 30 tablet 0  . dexamethasone (DECADRON) 4 MG tablet Take 5 tabs at the night before and 5 tab the morning of chemotherapy, every 3 weeks, by mouth 60 tablet 0  . ibuprofen (ADVIL,MOTRIN) 200 MG tablet Take 200 mg by mouth daily as needed for headache or moderate pain.    Marland Kitchen lidocaine-prilocaine (EMLA) cream Apply to affected area once 30 g 3  . lisinopril  (PRINIVIL,ZESTRIL) 10 MG tablet Take 10 mg by mouth daily.   1  . mometasone (ELOCON) 0.1 % cream Apply 1 application topically 2 (two) times daily as needed for rash.  12  . ondansetron (ZOFRAN) 8 MG tablet Take 1 tablet (8 mg total) by mouth every 8 (eight) hours as needed for refractory nausea / vomiting. Start on day 3 after chemo. 30 tablet 1  . oxyCODONE (OXY IR/ROXICODONE) 5 MG immediate release tablet Take 1 tablet (5 mg total) by mouth every 4 (four) hours as needed for severe pain or breakthrough pain. 30 tablet 0  . prochlorperazine (COMPAZINE) 10 MG tablet Take 1 tablet (10 mg total) by mouth every 6 (six) hours as needed (Nausea or vomiting). 30 tablet 1  . senna-docusate (SENOKOT-S) 8.6-50 MG tablet Take 2 tablets by mouth at bedtime. 30 tablet 0   No current facility-administered medications for this visit.  Facility-Administered Medications Ordered in Other Visits  Medication Dose Route Frequency Provider Last Rate Last Dose  . CARBOplatin (PARAPLATIN) 750 mg in sodium chloride 0.9 % 250 mL chemo infusion  750 mg Intravenous Once Heath Lark, MD 650 mL/hr at 12/31/17 1430 750 mg at 12/31/17 1430  . heparin lock flush 100 unit/mL  500 Units Intracatheter Once PRN Alvy Bimler, Ni, MD      . sodium chloride flush (NS) 0.9 % injection 10 mL  10 mL Intracatheter PRN Alvy Bimler, Ni, MD        PHYSICAL EXAMINATION: ECOG PERFORMANCE STATUS: 1 - Symptomatic but completely ambulatory  Vitals:   12/31/17 0820  BP: 137/78  Pulse: (!) 106  Resp: 18  Temp: 97.8 F (36.6 C)  SpO2: 100%   Filed Weights   12/31/17 0820  Weight: 182 lb 8 oz (82.8 kg)  GENERAL:alert, no distress and comfortable SKIN: skin color, texture, turgor are normal, no rashes or significant lesions EYES: normal, Conjunctiva are pink and non-injected, sclera clear OROPHARYNX:no exudate, no erythema and lips, buccal mucosa, and tongue normal  NECK: supple, thyroid normal size, non-tender, without nodularity LYMPH:   no palpable lymphadenopathy in the cervical, axillary or inguinal LUNGS: clear to auscultation and percussion with normal breathing effort HEART: regular rate & rhythm and no murmurs and no lower extremity edema ABDOMEN:abdomen soft, non-tender and normal bowel sounds Musculoskeletal:no cyanosis of digits and no clubbing  NEURO: alert & oriented x 3 with fluent speech, no focal motor/sensory deficits  LABORATORY DATA:  I have reviewed the data as listed    Component Value Date/Time   NA 141 12/31/2017 0751   K 3.8 12/31/2017 0751   CL 106 12/31/2017 0751   CO2 23 12/31/2017 0751   GLUCOSE 267 (H) 12/31/2017 0751   BUN 10 12/31/2017 0751   CREATININE 0.74 12/31/2017 0751   CREATININE 0.67 09/18/2017 1234   CALCIUM 10.3 12/31/2017 0751   PROT 7.7 12/31/2017 0751   ALBUMIN 4.1 12/31/2017 0751   AST 33 12/31/2017 0751   ALT 46 (H) 12/31/2017 0751   ALKPHOS 85 12/31/2017 0751   BILITOT 0.2 (L) 12/31/2017 0751   GFRNONAA >60 12/31/2017 0751   GFRNONAA >60 09/18/2017 1234   GFRAA >60 12/31/2017 0751   GFRAA >60 09/18/2017 1234    No results found for: SPEP, UPEP  Lab Results  Component Value Date   WBC 8.9 12/31/2017   NEUTROABS 7.7 12/31/2017   HGB 12.9 12/31/2017   HCT 40.2 12/31/2017   MCV 84.5 12/31/2017   PLT 344 12/31/2017      Chemistry      Component Value Date/Time   NA 141 12/31/2017 0751   K 3.8 12/31/2017 0751   CL 106 12/31/2017 0751   CO2 23 12/31/2017 0751   BUN 10 12/31/2017 0751   CREATININE 0.74 12/31/2017 0751   CREATININE 0.67 09/18/2017 1234      Component Value Date/Time   CALCIUM 10.3 12/31/2017 0751   ALKPHOS 85 12/31/2017 0751   AST 33 12/31/2017 0751   ALT 46 (H) 12/31/2017 0751   BILITOT 0.2 (L) 12/31/2017 0751    The total time spent discussing the her most recent laboratory studies, physical examination, role and rationale for continued treatment and recommendations was 25 minutes.  At least 50% of that time was spent in discussion,  counseling, and answering questions. There was ample time allotted to answer all questions.  This note was dictated using voice activated technology/software.  Unfortunately, typographical errors are not  uncommon, and transcription is subject to mistakes and regrettably misinterpretation.  If necessary, clarification of the above information can be discussed with me at any time.   Henreitta Leber, MD  Hematology/Oncology [place] [facility] [address] [place], [place] [address] Office: [phone] BFXO: [phone]

## 2017-12-31 NOTE — Patient Instructions (Signed)
We discussed in detail the results of your lab work from today.  Continue to take dexamethasone: 2 tablets the evening before chemotherapy on November 24.  Barring any unforeseen complications, your next scheduled doctor visit, laboratory studies, and cycle 5 of treatment is on January 21, 2018.  Please do not hesitate to call in the interim should any new or untoward problems arise.  Ladona Ridgel, MD Hematology/Oncology

## 2017-12-31 NOTE — Patient Instructions (Signed)
[facility] Discharge Instructions for Patients Receiving Chemotherapy  Today you received the following chemotherapy agents paclitaxel (Taxol) & carboplatin (Paraplatin).  To help prevent nausea and vomiting after your treatment, we encourage you to take your nausea medication as prescribed.    If you develop nausea and vomiting that is not controlled by your nausea medication, call the clinic.   BELOW ARE SYMPTOMS THAT SHOULD BE REPORTED IMMEDIATELY:  *FEVER GREATER THAN 100.5 F  *CHILLS WITH OR WITHOUT FEVER  NAUSEA AND VOMITING THAT IS NOT CONTROLLED WITH YOUR NAUSEA MEDICATION  *UNUSUAL SHORTNESS OF BREATH  *UNUSUAL BRUISING OR BLEEDING  TENDERNESS IN MOUTH AND THROAT WITH OR WITHOUT PRESENCE OF ULCERS  *URINARY PROBLEMS  *BOWEL PROBLEMS  UNUSUAL RASH Items with * indicate a potential emergency and should be followed up as soon as possible.  Feel free to call the clinic should you have any questions or concerns. The clinic phone number is [phone].  Please show the CHEMO ALERT CARD at check-in to the Emergency Department and triage nurse.  

## 2018-01-01 ENCOUNTER — Telehealth: Payer: Self-pay | Admitting: Hematology

## 2018-01-01 LAB — CA 125: Cancer Antigen (CA) 125: 12.9 U/mL (ref 0.0–38.1)

## 2018-01-01 NOTE — Telephone Encounter (Signed)
Appts scheduled and I notified patient of this as well per 11/4los

## 2018-01-19 NOTE — Progress Notes (Signed)
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  During the ER visit an ultrasound performed which identified an adnexal mass on the left, with no signs of torsion.  She was then seen in the offices of Dr. Barrie Franklin in June 2019 and a plan was made to proceed with surgical removal of the adnexa    08/23/2017 Surgery    On August 23, 2017 she was taken to the operating room for a laparoscopic left salpingo-oophorectomy.  Review of the operative note suggest that intraoperative findings were significant for a 6 cm left adnexal mass that was stuck in the cul-de-sac with some paraovarian adhesions noted.  It did not appear to be malignant in appearance to the surgeons at the time of surgery.  The upper abdomen, diaphragms, and omentum were commented on as looking normal.  There was no ascites.  The left tube and ovary was removed, however there was some fragmentation of the specimen during removal, and therefore the left tube and ovary were sent as 2 specimens.  The first specimen is labeled ovary biopsy for frozen section which revealed serous carcinoma and the second specimen is labeled left ovary on permanent this revealed serous carcinoma.  The operative note reports that the intraoperative diagnosis was borderline tumor, or reviewing the pathology report it states that the intraoperative diagnosis was left ovarian biopsy epithelial neoplasm at least borderline, cannot exclude carcinoma.  The frozen  section of the second specimen was the same (epithelial neoplasm at least borderline cannot exclude carcinoma.).  The patient was seen back in the office by Dr. Barrie Franklin on September 06, 2017 and delivered her diagnosis of carcinoma.  At that time she was referred for consultation with gynecologic oncology.    09/18/2017 Initial Diagnosis    Ovarian cancer on left [facility])    09/24/2017 Imaging    Chest Impression:  1. No evidence of thoracic metastasis. 2. Small angular nodule in the RIGHT middle lobe is favored benign.  Abdomen / Pelvis Impression:  1. No evidence of local recurrence of ovarian carcinoma. 2. No evidence of metastatic disease in the abdomen pelvis. 3. No free fluid    09/25/2017 Tumor Marker    Patient's tumor was tested for the following markers: CA-125 Results of the tumor marker test revealed 66.4    10/02/2017 Pathology Results    1. Omentum, resection other than for tumor - NO CARCINOMA IDENTIFIED 2. Uterus +/- tubes/ovaries, neoplastic, right fallopian tube and ovary - CARCINOMA INVOLVING SEROSA OF THE UTERUS AND ENDOMETRIUM - NO EVIDENCE OF CARCINOMA IN THE CERVIX, RIGHT OVARY OR FALLOPIAN TUBE - LEIOMYOMATA (2 CM; LARGEST) - SEE COMMENT 3. Lymph nodes, regional resection, right pelvic - NO CARCINOMA IDENTIFIED IN NINE LYMPH NODES (0/9) 4. Lymph nodes, regional resection, left pelvic - METASTATIC CARCINOMA INVOLVING ONE OF FIVE NODES - SEE COMMENT Microscopic Comment 2. The left lateral aspect of the uterine serosa has carcinoma which appears endometrioid with foci of squamous differentiation; serous features are  not seen. There are two focal areas of endometrioid carcinoma confined to the endometrium. Based on the size of the left ovarian lesion, left pelvic node involvement and minimal involvement of the endometrium, this is favored to be a primary ovarian with uterine involvement. However, given the different histologic subtyping of these lesions, it would be  beneficial to review the current case in conjunction with the patient's recently diagnosed ovarian serous carcinoma for accurate staging. This case was discussed with Dr. Denman Franklin on October 05, 2017. Dr. Lyndon Franklin reviewed the case and agrees with the above diagnosis. 4. Cytokeratin AE1/3 was utilized and highlights isolated tumor cells and a focus of tumor with similar histology as that seen on the serosal surface of the endometrium.    10/02/2017 Pathology Results    PERITONEAL WASHING(SPECIMEN 1 OF 1 COLLECTED 10/02/17): ATYPICAL CELLS. SEE COMMENT.    10/02/2017 Surgery    Surgeon: Kristy Franklin   Operation: Robotic-assisted laparoscopic total hysterectomy with bilateral salpingoophorectomy, omentectomy, bilateral pelvic lymphadenectomy  Surgeon: Kristy Franklin  Operative Findings:  : 6cm grossly normal uterus (with pedunculated 1cm fibroid), normal appearing right tube and ovary. There was a thin layer of apparent residual left ovary adherent to the uterus/left ovarian fossa (completely resected with this surgery). No ascites, normal appendix and small bowel. Normal appearing omentum. No suspicious nodes.  No residual visible cancer remaining at the completion of the surgery.          Specimens: washings, omentum, uterus with cervix and right tube and ovary, right and left pelvic lymph nodes.     10/16/2017 Cancer Staging    Staging form: Ovary, Fallopian Tube, and Primary Peritoneal Carcinoma, AJCC 8th Edition - Pathologic: FIGO Stage III (pT3, pN1, cM0) - Signed by Kristy Lark, MD on 10/17/2017    10/18/2017 Genetic Testing    Negative genetic testing on the [facility] panel.  The [facility] gene panel offered by [employer] includes sequencing and deletion/duplication testing of the following 35 genes: APC, ATM, AXIN2, BARD1, BMPR1A, BRCA1, BRCA2, BRIP1, CHD1, CDK4, CDKN2A, CHEK2, EPCAM (large rearrangement only), HOXB13, (sequencing only), GALNT12, MLH1, MSH2, MSH3  (excluding repetitive portions of exon 1), MSH6, MUTYH, NBN, NTHL1, PALB2, PMS2, PTEN, RAD51C, RAD51D, RNF43, RPS20, SMAD4, STK11, and TP53. Sequencing was performed for select regions of POLE and POLD1, and large rearrangement analysis was performed for select regions of GREM1. The report date is October 18, 2017.    10/24/2017 Procedure    Ultrasound and fluoroscopically guided right internal jugular single lumen power port catheter insertion. Tip in the SVC/RA junction. Catheter ready for use.    10/25/2017 Tumor Marker    Patient's tumor was tested for the following markers: CA-125 Results of the tumor marker test revealed 24.2    11/19/2017 Tumor Marker    Patient's tumor was tested for the following markers: CA-125 Results of the tumor marker test revealed 15.5    CURRENT THERAPY Adjuvant Carboplatin and Taxol  INTERVAL HISTORY: Kristy Franklin is a 47 y.o. female who is here for follow-up. I see her today in Dr. Calton Dach absence. She was seen by Dr. Truddie Coco in 12/31/2017. Today, she is here with her husband and interpreter. She says that she sometimes feels nauseated, and uses nausea medications for relief. She feels fatigued a few days after chemo, but recovers later. She also complains of occasional constipation, and RUQ intermittent abdominal pain that occurs everyday and is worse by laying down. The pain is 4/10 and she can still perform her  daily activities. She denies having diabetes or high BG before chemotherapy and dexamethasone. She also noticed mild itching alongside small pumps on her right arm that are shrinking.  Pertinent positives and negatives of review of systems are listed and detailed within the above HPI.   REVIEW OF SYSTEMS:   Constitutional: Denies fevers, chills or abnormal weight loss Eyes: Denies blurriness of vision Ears, nose, mouth, throat, and face: Denies mucositis or sore throat Respiratory: Denies cough, dyspnea or wheezes Cardiovascular: Denies  palpitation, chest discomfort or lower extremity swelling Gastrointestinal:  Denies heartburn (+) intermittent RUQ abdominal pain (+) constipation (+) nausea after treatment Skin: Denies abnormal skin rashes (+) itching, mild and tolerable  Lymphatics: Denies new lymphadenopathy or easy bruising Neurological:Denies numbness, tingling or new weaknesses Behavioral/Psych: Mood is stable, no new changes  All other systems were reviewed with the patient and are negative.  MEDICAL HISTORY:  Past Medical History:  Diagnosis Date  . Abdominal pain    Right mid only with bowel movements  . Blood in urine   . Change in bowel movement    more green color  . Family history of prostate cancer   . GERD (gastroesophageal reflux disease)   . Headache    after menstrual cycle every month  . History of gallstones   . Hypertension   . Neck pain on right side    pulsating  . Numbness and tingling of right leg   . Obesity   . Ovarian cancer, left ([place])   . PPD positive   . Skin rash    Chest, forehead, left arm    SURGICAL HISTORY: Past Surgical History:  Procedure Laterality Date  . CESAREAN SECTION     x2  . CHOLECYSTECTOMY     20 years ago  . IR IMAGING GUIDED PORT INSERTION  10/24/2017  . OMENTECTOMY N/A 10/02/2017   Procedure: OMENTECTOMY;  Surgeon: Everitt Amber, MD;  Location: [facility];  Service: Gynecology;  Laterality: N/A;  . OTHER SURGICAL HISTORY  07/2017   Left ovary removed  . ROBOTIC ASSISTED TOTAL HYSTERECTOMY WITH BILATERAL SALPINGO OOPHERECTOMY Right 10/02/2017   Procedure: XI ROBOTIC ASSISTED TOTAL HYSTERECTOMY WITH RIGHT SALPINGO OOPHORECTOMY;  Surgeon: Everitt Amber, MD;  Location: [facility];  Service: Gynecology;  Laterality: Right;  . ROBOTIC PELVIC AND PARA-AORTIC LYMPH NODE DISSECTION Bilateral 10/02/2017   Procedure: XI ROBOTIC PELVIC LYMPH NODE DISSECTION;  Surgeon: Everitt Amber, MD;  Location: [facility];  Service: Gynecology;  Laterality: Bilateral;    I have reviewed the social  history and family history with the patient and they are unchanged from previous note.  ALLERGIES:  is allergic to other.  MEDICATIONS:  Current Outpatient Medications  Medication Sig Dispense Refill  . acetaminophen (TYLENOL) 500 MG tablet Take 2 tablets (1,000 mg total) by mouth every 6 (six) hours. 30 tablet 0  . dexamethasone (DECADRON) 4 MG tablet Take 5 tabs at the night before and 5 tab the morning of chemotherapy, every 3 weeks, by mouth (Patient taking differently: 8 mg. Take 2 tabs at the night before and 2 tab the morning of chemotherapy, every 3 weeks, by mouth) 60 tablet 0  . ibuprofen (ADVIL,MOTRIN) 200 MG tablet Take 200 mg by mouth daily as needed for headache or moderate pain.    Marland Kitchen lidocaine-prilocaine (EMLA) cream Apply to affected area once 30 g 3  . lisinopril (PRINIVIL,ZESTRIL) 10 MG tablet Take 10 mg by mouth daily.   1  . mometasone (ELOCON) 0.1 % cream Apply 1  application topically 2 (two) times daily as needed for rash.  12  . ondansetron (ZOFRAN) 8 MG tablet Take 1 tablet (8 mg total) by mouth every 8 (eight) hours as needed for refractory nausea / vomiting. Start on day 3 after chemo. 30 tablet 1  . oxyCODONE (OXY IR/ROXICODONE) 5 MG immediate release tablet Take 1 tablet (5 mg total) by mouth every 4 (four) hours as needed for severe pain or breakthrough pain. 30 tablet 0  . prochlorperazine (COMPAZINE) 10 MG tablet Take 1 tablet (10 mg total) by mouth every 6 (six) hours as needed (Nausea or vomiting). 30 tablet 1  . senna-docusate (SENOKOT-S) 8.6-50 MG tablet Take 2 tablets by mouth at bedtime. 30 tablet 0   No current facility-administered medications for this visit.     PHYSICAL EXAMINATION: ECOG PERFORMANCE STATUS: 1 - Symptomatic but completely ambulatory  Vitals:   01/21/18 0853  BP: 126/83  Pulse: (!) 104  Resp: 18  Temp: 97.9 F (36.6 C)  SpO2: 100%   Filed Weights   01/21/18 0853  Weight: 180 lb 11.2 oz (82 kg)    GENERAL:alert, no distress  and comfortable SKIN: skin color, texture, turgor are normal, no rashes (+) a small subcutaneous nodule on the lateral right arm. (+) alopecia  EYES: normal, Conjunctiva are pink and non-injected, sclera clear OROPHARYNX:no exudate, no erythema and lips, buccal mucosa, and tongue normal  NECK: supple, thyroid normal size, non-tender, without nodularity LYMPH:  no palpable lymphadenopathy in the cervical, axillary or inguinal LUNGS: clear to auscultation and percussion with normal breathing effort HEART: regular rate & rhythm and no murmurs and no lower extremity edema ABDOMEN:abdomen soft, non-tender and normal bowel sounds Musculoskeletal:no cyanosis of digits and no clubbing  NEURO: alert & oriented x 3 with fluent speech, no focal motor/sensory deficits  LABORATORY DATA:  I have reviewed the data as listed CBC Latest Ref Rng & Units 01/21/2018 12/31/2017 12/10/2017  WBC 4.0 - 10.5 K/uL 8.5 8.9 10.6(H)  Hemoglobin 12.0 - 15.0 g/dL 12.7 12.9 12.8  Hematocrit 36.0 - 46.0 % 39.1 40.2 38.8  Platelets 150 - 400 K/uL 282 344 452(H)     CMP Latest Ref Rng & Units 01/21/2018 12/31/2017 12/10/2017  Glucose 70 - 99 mg/dL 203(H) 267(H) 263(H)  BUN 6 - 20 mg/dL '11 10 12  '$ Creatinine 0.44 - 1.00 mg/dL 0.70 0.74 0.62  Sodium 135 - 145 mmol/L 141 141 142  Potassium 3.5 - 5.1 mmol/L 3.8 3.8 3.9  Chloride 98 - 111 mmol/L 107 106 106  CO2 22 - 32 mmol/L '24 23 24  '$ Calcium 8.9 - 10.3 mg/dL 10.1 10.3 10.1  Total Protein 6.5 - 8.1 g/dL 7.6 7.7 7.8  Total Bilirubin 0.3 - 1.2 mg/dL 0.3 0.2(L) 0.2(L)  Alkaline Phos 38 - 126 U/L 81 85 80  AST 15 - 41 U/L 29 33 46(H)  ALT 0 - 44 U/L 43 46(H) 47(H)      RADIOGRAPHIC STUDIES: I have personally reviewed the radiological images as listed and agreed with the findings in the report. No results found.   ASSESSMENT & PLAN:  Kristy Franklin is a 47 y.o. female with history of   1. Left Ovarian Cancer with uterine involvement, pT3N1M0 -I have reviewed  her medical records. She was diagnosed in June 2019, s/p surgical resection, now on adjuvant chemo carboplatin and Taxol  - She tolerated treatment with mild neuropathy, arthralgia, and steroids induced hyperglycemia. -Labs reviewed,  CBC and CMP are overall WNLs except BG  203  -will proceed with cycle 5 chemo today -she will return in 3 weeks for cycle 6 chemo and see Dr. Alvy Bimler -we discussed cancer surveillance after she completes adjuvant chemotherapy, including follow-up, lab, and surveillance CT scans.  2. Steroid-induced hyperglycemia -She has had steroids induced hyperglycemia, no prior history of diabetes, her random blood glucose has been in 200-300 range.   -Chemo pre-meds dexamethasone has been reduced to 8 mg the night before treatment and to 8 mg the morning of treatment -Random blood glucose of 208 today, I recommend her to take metformin 500 mg daily, prescription called in today  -will check her Hba1c on next visit   3. Chemotherapy induced neuropathy, G1  -Stable   4. Elevated liver enzymes -Likely due to fatty liver -continue monitoring   Plan  -lab reviewed, will proceed with cycle 5 chemo today -start metformin '500mg'$  daily  -f/u in 3 weeks with Dr. Alvy Bimler and cycle 6 chemo     No problem-specific Assessment & Plan notes found for this encounter.   No orders of the defined types were placed in this encounter.  All questions were answered. The patient knows to call the clinic with any problems, questions or concerns. No barriers to learning was detected. I spent 20 minutes counseling the patient face to face. The total time spent in the appointment was 25 minutes and more than 50% was on counseling and review of test results  I, Noor Dweik am acting as scribe for Dr. Truitt Merle.  I have reviewed the above documentation for accuracy and completeness, and I agree with the above.      Truitt Merle, MD 01/21/2018

## 2018-01-21 ENCOUNTER — Ambulatory Visit: Payer: Self-pay | Admitting: Genetic Counselor

## 2018-01-21 ENCOUNTER — Inpatient Hospital Stay: Payer: Self-pay

## 2018-01-21 ENCOUNTER — Telehealth: Payer: Self-pay | Admitting: Hematology

## 2018-01-21 ENCOUNTER — Encounter: Payer: Self-pay | Admitting: Hematology

## 2018-01-21 ENCOUNTER — Inpatient Hospital Stay (HOSPITAL_BASED_OUTPATIENT_CLINIC_OR_DEPARTMENT_OTHER): Payer: Self-pay | Admitting: Hematology

## 2018-01-21 VITALS — BP 126/83 | HR 104 | Temp 97.9°F | Resp 18 | Ht 63.0 in | Wt 180.7 lb

## 2018-01-21 VITALS — HR 99

## 2018-01-21 DIAGNOSIS — Z791 Long term (current) use of non-steroidal anti-inflammatories (NSAID): Secondary | ICD-10-CM

## 2018-01-21 DIAGNOSIS — G620 Drug-induced polyneuropathy: Secondary | ICD-10-CM

## 2018-01-21 DIAGNOSIS — T380X5A Adverse effect of glucocorticoids and synthetic analogues, initial encounter: Secondary | ICD-10-CM

## 2018-01-21 DIAGNOSIS — K5900 Constipation, unspecified: Secondary | ICD-10-CM

## 2018-01-21 DIAGNOSIS — Z79899 Other long term (current) drug therapy: Secondary | ICD-10-CM

## 2018-01-21 DIAGNOSIS — C562 Malignant neoplasm of left ovary: Secondary | ICD-10-CM

## 2018-01-21 DIAGNOSIS — R300 Dysuria: Secondary | ICD-10-CM

## 2018-01-21 DIAGNOSIS — R1011 Right upper quadrant pain: Secondary | ICD-10-CM

## 2018-01-21 DIAGNOSIS — R739 Hyperglycemia, unspecified: Secondary | ICD-10-CM

## 2018-01-21 DIAGNOSIS — Z7952 Long term (current) use of systemic steroids: Secondary | ICD-10-CM

## 2018-01-21 DIAGNOSIS — Z1379 Encounter for other screening for genetic and chromosomal anomalies: Secondary | ICD-10-CM

## 2018-01-21 DIAGNOSIS — R748 Abnormal levels of other serum enzymes: Secondary | ICD-10-CM

## 2018-01-21 LAB — COMPREHENSIVE METABOLIC PANEL
ALBUMIN: 4.2 g/dL (ref 3.5–5.0)
ALK PHOS: 81 U/L (ref 38–126)
ALT: 43 U/L (ref 0–44)
AST: 29 U/L (ref 15–41)
Anion gap: 10 (ref 5–15)
BILIRUBIN TOTAL: 0.3 mg/dL (ref 0.3–1.2)
BUN: 11 mg/dL (ref 6–20)
CALCIUM: 10.1 mg/dL (ref 8.9–10.3)
CO2: 24 mmol/L (ref 22–32)
CREATININE: 0.7 mg/dL (ref 0.44–1.00)
Chloride: 107 mmol/L (ref 98–111)
GFR calc Af Amer: >60 mL/min (ref >60)
GFR calc non Af Amer: >60 mL/min (ref >60)
GLUCOSE: 203 mg/dL — AB (ref 70–99)
Potassium: 3.8 mmol/L (ref 3.5–5.1)
Sodium: 141 mmol/L (ref 135–145)
TOTAL PROTEIN: 7.6 g/dL (ref 6.5–8.1)

## 2018-01-21 LAB — CBC WITH DIFFERENTIAL/PLATELET
Abs Immature Granulocytes: 0.05 10*3/uL (ref 0.00–0.07)
BASOS PCT: 0 %
Basophils Absolute: 0 10*3/uL (ref 0.0–0.1)
EOS ABS: 0 10*3/uL (ref 0.0–0.5)
Eosinophils Relative: 0 %
HEMATOCRIT: 39.1 % (ref 36.0–46.0)
Hemoglobin: 12.7 g/dL (ref 12.0–15.0)
Immature Granulocytes: 1 %
LYMPHS ABS: 1.1 10*3/uL (ref 0.7–4.0)
Lymphocytes Relative: 13 %
MCH: 27.4 pg (ref 26.0–34.0)
MCHC: 32.5 g/dL (ref 30.0–36.0)
MCV: 84.4 fL (ref 80.0–100.0)
MONO ABS: 0.1 10*3/uL (ref 0.1–1.0)
MONOS PCT: 1 %
Neutro Abs: 7.2 10*3/uL (ref 1.7–7.7)
Neutrophils Relative %: 85 %
PLATELETS: 282 10*3/uL (ref 150–400)
RBC: 4.63 MIL/uL (ref 3.87–5.11)
RDW: 15.6 % — ABNORMAL HIGH (ref 11.5–15.5)
WBC: 8.5 10*3/uL (ref 4.0–10.5)
nRBC: 0 % (ref 0.0–0.2)

## 2018-01-21 MED ORDER — SODIUM CHLORIDE 0.9% FLUSH
10.0000 mL | INTRAVENOUS | Status: DC | PRN
  Administered 2018-01-21: 10 mL
  Filled 2018-01-21: qty 10

## 2018-01-21 MED ORDER — PALONOSETRON HCL INJECTION 0.25 MG/5ML
INTRAVENOUS | Status: AC
  Filled 2018-01-21: qty 5

## 2018-01-21 MED ORDER — PALONOSETRON HCL INJECTION 0.25 MG/5ML
0.2500 mg | Freq: Once | INTRAVENOUS | Status: AC
  Administered 2018-01-21: 0.25 mg via INTRAVENOUS

## 2018-01-21 MED ORDER — FAMOTIDINE IN NACL 20-0.9 MG/50ML-% IV SOLN
INTRAVENOUS | Status: AC
  Filled 2018-01-21: qty 50

## 2018-01-21 MED ORDER — DIPHENHYDRAMINE HCL 50 MG/ML IJ SOLN
50.0000 mg | Freq: Once | INTRAMUSCULAR | Status: AC
  Administered 2018-01-21: 50 mg via INTRAVENOUS

## 2018-01-21 MED ORDER — FAMOTIDINE IN NACL 20-0.9 MG/50ML-% IV SOLN
20.0000 mg | Freq: Once | INTRAVENOUS | Status: AC
  Administered 2018-01-21: 20 mg via INTRAVENOUS

## 2018-01-21 MED ORDER — METFORMIN HCL 500 MG PO TABS: 500.0000 mg | ORAL_TABLET | Freq: Every day | ORAL | 0 refills | Status: DC

## 2018-01-21 MED ORDER — HEPARIN SOD (PORK) LOCK FLUSH 100 UNIT/ML IV SOLN
500.0000 [IU] | Freq: Once | INTRAVENOUS | Status: AC | PRN
  Administered 2018-01-21: 500 [IU]
  Filled 2018-01-21: qty 5

## 2018-01-21 MED ORDER — SODIUM CHLORIDE 0.9 % IV SOLN
750.0000 mg | Freq: Once | INTRAVENOUS | Status: AC
  Administered 2018-01-21: 750 mg via INTRAVENOUS
  Filled 2018-01-21: qty 75

## 2018-01-21 MED ORDER — SODIUM CHLORIDE 0.9 % IV SOLN
116.6667 mg/m2 | Freq: Once | INTRAVENOUS | Status: AC
  Administered 2018-01-21: 228 mg via INTRAVENOUS
  Filled 2018-01-21: qty 38

## 2018-01-21 MED ORDER — SODIUM CHLORIDE 0.9 % IV SOLN
Freq: Once | INTRAVENOUS | Status: AC
  Administered 2018-01-21: 10:00:00 via INTRAVENOUS
  Filled 2018-01-21: qty 5

## 2018-01-21 MED ORDER — DIPHENHYDRAMINE HCL 50 MG/ML IJ SOLN
INTRAMUSCULAR | Status: AC
  Filled 2018-01-21: qty 1

## 2018-01-21 MED ORDER — SODIUM CHLORIDE 0.9% FLUSH
10.0000 mL | Freq: Once | INTRAVENOUS | Status: AC
  Administered 2018-01-21: 10 mL
  Filled 2018-01-21: qty 10

## 2018-01-21 MED ORDER — SODIUM CHLORIDE 0.9 % IV SOLN
Freq: Once | INTRAVENOUS | Status: AC
  Administered 2018-01-21: 10:00:00 via INTRAVENOUS
  Filled 2018-01-21: qty 250

## 2018-01-21 NOTE — Progress Notes (Signed)
HPI:  Kristy Franklin was previously seen in the [facility] clinic due to a personal history of cancer and concerns regarding a hereditary predisposition to cancer. Please refer to our prior cancer genetics clinic note for more information regarding Kristy Franklin's medical, social and family histories, and our assessment and recommendations, at the time. Kristy Franklin recent genetic test results were disclosed to her, as were recommendations warranted by these results. These results and recommendations are discussed in more detail below.  Kristy Franklin was also seen in clinic today briefly, January 21, 2018, to review her HRD testing below.  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  During the ER visit an ultrasound performed which identified an adnexal mass on the left, with no signs of torsion.  She was then seen in the offices of Dr. Barrie Dunker in June 2019 and a plan was made to proceed with surgical removal of the adnexa    08/23/2017 Surgery    On August 23, 2017 she was taken to the operating room for a laparoscopic left salpingo-oophorectomy.  Review of the operative note suggest that intraoperative findings were significant for a 6 cm left adnexal mass that was stuck in the cul-de-sac with some paraovarian adhesions noted.  It did not appear to be malignant in appearance to the surgeons at the time of surgery.  The upper abdomen, diaphragms, and omentum were commented on as looking normal.  There was no ascites.  The left tube and ovary was removed, however there was some fragmentation of the specimen during removal, and therefore the left tube and ovary were sent as 2 specimens.  The first  specimen is labeled ovary biopsy for frozen section which revealed serous carcinoma and the second specimen is labeled left ovary on permanent this revealed serous carcinoma.  The operative note reports that the intraoperative diagnosis was borderline tumor, or reviewing the pathology report it states that the intraoperative diagnosis was left ovarian biopsy epithelial neoplasm at least borderline, cannot exclude carcinoma.  The frozen section of the second specimen was the same (epithelial neoplasm at least borderline cannot exclude carcinoma.).  The patient was seen back in the office by Dr. Barrie Dunker on September 06, 2017 and delivered her diagnosis of carcinoma.  At that time she was referred for consultation with gynecologic oncology.    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 No evidence of thoracic metastasis. 2. Small angular nodule in the RIGHT middle lobe is favored benign.  Abdomen / Pelvis Impression:  1. No evidence of local recurrence of ovarian carcinoma. 2. No evidence of metastatic disease in the abdomen pelvis. 3. No free fluid    09/25/2017 Tumor Marker    Patient's tumor was tested for the following markers: CA-125 Results of the tumor marker test revealed 66.4    10/02/2017 Pathology Results    1. Omentum, resection other than for tumor - NO CARCINOMA IDENTIFIED 2. Uterus +/- tubes/ovaries, neoplastic, right fallopian tube and ovary - CARCINOMA INVOLVING SEROSA OF THE UTERUS AND ENDOMETRIUM - NO EVIDENCE OF CARCINOMA IN THE CERVIX, RIGHT OVARY OR FALLOPIAN TUBE -  LEIOMYOMATA (2 CM; LARGEST) - SEE COMMENT 3. Lymph nodes, regional resection, right pelvic - NO CARCINOMA IDENTIFIED IN NINE LYMPH NODES (0/9) 4. Lymph nodes, regional resection, left pelvic - METASTATIC CARCINOMA INVOLVING ONE OF FIVE NODES - SEE COMMENT Microscopic Comment 2. The left lateral aspect of the uterine serosa has carcinoma which  appears endometrioid with foci of squamous differentiation; serous features are not seen. There are two focal areas of endometrioid carcinoma confined to the endometrium. Based on the size of the left ovarian lesion, left pelvic node involvement and minimal involvement of the endometrium, this is favored to be a primary ovarian with uterine involvement. However, given the different histologic subtyping of these lesions, it would be beneficial to review the current case in conjunction with the patient's recently diagnosed ovarian serous carcinoma for accurate staging. This case was discussed with Dr. Denman George on October 05, 2017. Dr. Lyndon Code reviewed the case and agrees with the above diagnosis. 4. Cytokeratin AE1/3 was utilized and highlights isolated tumor cells and a focus of tumor with similar histology as that seen on the serosal surface of the endometrium.    10/02/2017 Pathology Results    PERITONEAL WASHING(SPECIMEN 1 OF 1 COLLECTED 10/02/17): ATYPICAL CELLS. SEE COMMENT.    10/02/2017 Surgery    Surgeon: Donaciano Eva   Operation: Robotic-assisted laparoscopic total hysterectomy with bilateral salpingoophorectomy, omentectomy, bilateral pelvic lymphadenectomy  Surgeon: Donaciano Eva  Operative Findings:  : 6cm grossly normal uterus (with pedunculated 1cm fibroid), normal appearing right tube and ovary. There was a thin layer of apparent residual left ovary adherent to the uterus/left ovarian fossa (completely resected with this surgery). No ascites, normal appendix and small bowel. Normal appearing omentum. No suspicious nodes.  No residual visible cancer remaining at the completion of the surgery.          Specimens: washings, omentum, uterus with cervix and right tube and ovary, right and left pelvic lymph nodes.     10/16/2017 Cancer Staging    Staging form: Ovary, Fallopian Tube, and Primary Peritoneal Carcinoma, AJCC 8th Edition - Pathologic: FIGO Stage III (pT3, pN1, cM0) -  Signed by Heath Lark, MD on 10/17/2017    10/18/2017 Genetic Testing    Negative genetic testing on the [facility] panel.  The [facility] gene panel offered by [employer] includes sequencing and deletion/duplication testing of the following 35 genes: APC, ATM, AXIN2, BARD1, BMPR1A, BRCA1, BRCA2, BRIP1, CHD1, CDK4, CDKN2A, CHEK2, EPCAM (large rearrangement only), HOXB13, (sequencing only), GALNT12, MLH1, MSH2, MSH3 (excluding repetitive portions of exon 1), MSH6, MUTYH, NBN, NTHL1, PALB2, PMS2, PTEN, RAD51C, RAD51D, RNF43, RPS20, SMAD4, STK11, and TP53. Sequencing was performed for select regions of POLE and POLD1, and large rearrangement analysis was performed for select regions of GREM1. The report date is October 18, 2017.    10/24/2017 Procedure    Ultrasound and fluoroscopically guided right internal jugular single lumen power port catheter insertion. Tip in the SVC/RA junction. Catheter ready for use.    10/25/2017 Tumor Marker    Patient's tumor was tested for the following markers: CA-125 Results of the tumor marker test revealed 24.2    11/19/2017 Tumor Marker    Patient's tumor was tested for the following markers: CA-125 Results of the tumor marker test revealed 15.5     FAMILY HISTORY:  We obtained a detailed, 4-generation family history.  Significant diagnoses are listed below: Family History  Problem Relation Age of Onset  . Hypertension Mother   . Prostate cancer Father  75       d. 24  . Diabetes Paternal Aunt   . Heart attack Paternal Aunt   . Heart attack Paternal Grandmother 95  . Thyroid disease Other 14    The patient has a son and daughter who are cancer free.  She has two brothers and three sisters, all who are cancer free.  Her father is deceased, but her mother is living.  The patient's mother has a brother and sister who are cancer free.  Her parents are deceased from non-cancer related issues.  The patient's father was diagnosed with prostate cancer  at 83 and died at 57.  He has one sister who died of diabetes and a heart attack.  He has multiple maternal half siblings who are not known to have cancer.  The paternal grandparents are deceased from a heart attack and lung problems.  Kristy Franklin is unaware of previous family history of genetic testing for hereditary cancer risks. Patient's maternal ancestors are of [place] descent, and paternal ancestors are of [place] descent. There is no reported Ashkenazi Jewish ancestry. There is no known consanguinity.  GENETIC TEST RESULTS: Genetic testing reported out on October 18, 2017 through the [facility] cancer panel found no deleterious mutations.  The [facility] gene panel offered by [employer] includes sequencing and deletion/duplication testing of the following 35 genes: APC, ATM, AXIN2, BARD1, BMPR1A, BRCA1, BRCA2, BRIP1, CHD1, CDK4, CDKN2A, CHEK2, EPCAM (large rearrangement only), HOXB13, (sequencing only), GALNT12, MLH1, MSH2, MSH3 (excluding repetitive portions of exon 1), MSH6, MUTYH, NBN, NTHL1, PALB2, PMS2, PTEN, RAD51C, RAD51D, RNF43, RPS20, SMAD4, STK11, and TP53. Sequencing was performed for select regions of POLE and POLD1, and large rearrangement analysis was performed for select regions of GREM1.   HRD testing was also attempted, but could not be resulted due to the tissue type (endometrial) that the tumor was located on.   The test report has been scanned into EPIC and is located under the Molecular Pathology section of the Results Review tab.    We discussed with Kristy Franklin that since the current genetic testing is not perfect, it is possible there may be a gene mutation in one of these genes that current testing cannot detect, but that chance is small.  We also discussed, that it is possible that another gene that has not yet been discovered, or that we have not yet tested, is responsible for the cancer diagnoses in the family, and it is, therefore, important to remain in  touch with cancer genetics in the future so that we can continue to offer Kristy Franklin the most up to date genetic testing.      CANCER SCREENING RECOMMENDATIONS: This result is reassuring and indicates that Kristy Franklin likely does not have an increased risk for a future cancer due to a mutation in one of these genes. This normal test also suggests that Kristy Franklin's cancer was most likely not due to an inherited predisposition associated with one of these genes.  Most cancers happen by chance and this negative test suggests that her cancer falls into this category.  We, therefore, recommended she continue to follow the cancer management and screening guidelines provided by her oncology and primary healthcare provider.   An individual's cancer risk and medical management are not determined by genetic test results alone. Overall cancer risk assessment incorporates additional factors, including personal medical history, family history, and any available genetic information that may result in a personalized plan for cancer prevention and surveillance.  RECOMMENDATIONS FOR FAMILY MEMBERS:  Women in this family might be at some increased risk of developing cancer, over the general population risk, simply due to the family history of cancer.  We recommended women in this family have a yearly mammogram beginning at age 26, or 27 years younger than the earliest onset of cancer, an annual clinical breast exam, and perform monthly breast self-exams. Women in this family should also have a gynecological exam as recommended by their primary provider. All family members should have a colonoscopy by age 68.  FOLLOW-UP: Lastly, we discussed with Kristy Franklin that cancer genetics is a rapidly advancing field and it is possible that new genetic tests will be appropriate for her and/or her family members in the future. We encouraged her to remain in contact with cancer genetics on an annual basis so we can update  her personal and family histories and let her know of advances in cancer genetics that may benefit this family.   Our contact number was provided. Ms. All questions were answered to her satisfaction, and she knows she is welcome to call us at anytime with additional questions or concerns.   Roma Kayser, MS, [facility] Certified Genetic Counselor [email or web address]

## 2018-01-21 NOTE — Patient Instructions (Signed)
[facility] Discharge Instructions for Patients Receiving Chemotherapy  Today you received the following chemotherapy agents paclitaxel (Taxol) & carboplatin (Paraplatin).  To help prevent nausea and vomiting after your treatment, we encourage you to take your nausea medication as prescribed.    If you develop nausea and vomiting that is not controlled by your nausea medication, call the clinic.   BELOW ARE SYMPTOMS THAT SHOULD BE REPORTED IMMEDIATELY:  *FEVER GREATER THAN 100.5 F  *CHILLS WITH OR WITHOUT FEVER  NAUSEA AND VOMITING THAT IS NOT CONTROLLED WITH YOUR NAUSEA MEDICATION  *UNUSUAL SHORTNESS OF BREATH  *UNUSUAL BRUISING OR BLEEDING  TENDERNESS IN MOUTH AND THROAT WITH OR WITHOUT PRESENCE OF ULCERS  *URINARY PROBLEMS  *BOWEL PROBLEMS  UNUSUAL RASH Items with * indicate a potential emergency and should be followed up as soon as possible.  Feel free to call the clinic should you have any questions or concerns. The clinic phone number is [phone].  Please show the [facility] at check-in to the Emergency Department and triage nurse.

## 2018-01-21 NOTE — Telephone Encounter (Signed)
Printed calendar and avs. °

## 2018-01-22 LAB — CA 125: Cancer Antigen (CA) 125: 10.7 U/mL (ref 0.0–38.1)

## 2018-01-30 ENCOUNTER — Telehealth: Payer: Self-pay | Admitting: Oncology

## 2018-01-30 NOTE — Telephone Encounter (Signed)
Kristy Franklin called and said that she still has "knots" which she described as "lymph nodes" on her left and right arm and also on her lower back.  She thinks they are smaller and had notified Dr. Burr Medico at her last appointment.  She wants to make sure Dr. Alvy Bimler is aware for her next appointment on 02/12/18.

## 2018-02-11 ENCOUNTER — Ambulatory Visit: Payer: Self-pay

## 2018-02-12 ENCOUNTER — Telehealth: Payer: Self-pay | Admitting: Hematology and Oncology

## 2018-02-12 ENCOUNTER — Inpatient Hospital Stay: Payer: Self-pay

## 2018-02-12 ENCOUNTER — Encounter: Payer: Self-pay | Admitting: Hematology and Oncology

## 2018-02-12 ENCOUNTER — Inpatient Hospital Stay (HOSPITAL_BASED_OUTPATIENT_CLINIC_OR_DEPARTMENT_OTHER): Payer: Self-pay | Admitting: Hematology and Oncology

## 2018-02-12 ENCOUNTER — Inpatient Hospital Stay: Payer: Self-pay | Attending: Gynecologic Oncology

## 2018-02-12 VITALS — BP 128/72 | HR 94 | Temp 98.2°F | Resp 18 | Ht 63.0 in | Wt 180.1 lb

## 2018-02-12 DIAGNOSIS — T380X5A Adverse effect of glucocorticoids and synthetic analogues, initial encounter: Secondary | ICD-10-CM

## 2018-02-12 DIAGNOSIS — Z791 Long term (current) use of non-steroidal anti-inflammatories (NSAID): Secondary | ICD-10-CM

## 2018-02-12 DIAGNOSIS — Z79899 Other long term (current) drug therapy: Secondary | ICD-10-CM | POA: Insufficient documentation

## 2018-02-12 DIAGNOSIS — T451X5A Adverse effect of antineoplastic and immunosuppressive drugs, initial encounter: Secondary | ICD-10-CM

## 2018-02-12 DIAGNOSIS — C562 Malignant neoplasm of left ovary: Secondary | ICD-10-CM

## 2018-02-12 DIAGNOSIS — R739 Hyperglycemia, unspecified: Secondary | ICD-10-CM | POA: Insufficient documentation

## 2018-02-12 DIAGNOSIS — G620 Drug-induced polyneuropathy: Secondary | ICD-10-CM | POA: Insufficient documentation

## 2018-02-12 DIAGNOSIS — R110 Nausea: Secondary | ICD-10-CM

## 2018-02-12 DIAGNOSIS — Z5111 Encounter for antineoplastic chemotherapy: Secondary | ICD-10-CM | POA: Insufficient documentation

## 2018-02-12 LAB — COMPREHENSIVE METABOLIC PANEL
ALT: 42 U/L (ref 0–44)
AST: 30 U/L (ref 15–41)
Albumin: 4.2 g/dL (ref 3.5–5.0)
Alkaline Phosphatase: 89 U/L (ref 38–126)
Anion gap: 12 (ref 5–15)
BUN: 10 mg/dL (ref 6–20)
CO2: 23 mmol/L (ref 22–32)
Calcium: 10.3 mg/dL (ref 8.9–10.3)
Chloride: 106 mmol/L (ref 98–111)
Creatinine, Ser: 0.69 mg/dL (ref 0.44–1.00)
GFR calc Af Amer: >60 mL/min (ref >60)
GFR calc non Af Amer: >60 mL/min (ref >60)
Glucose, Bld: 213 mg/dL — ABNORMAL HIGH (ref 70–99)
Potassium: 3.9 mmol/L (ref 3.5–5.1)
Sodium: 141 mmol/L (ref 135–145)
Total Bilirubin: 0.2 mg/dL — ABNORMAL LOW (ref 0.3–1.2)
Total Protein: 7.7 g/dL (ref 6.5–8.1)

## 2018-02-12 LAB — CBC WITH DIFFERENTIAL/PLATELET
Abs Immature Granulocytes: 0.06 10*3/uL (ref 0.00–0.07)
Basophils Absolute: 0 10*3/uL (ref 0.0–0.1)
Basophils Relative: 0 %
Eosinophils Absolute: 0 10*3/uL (ref 0.0–0.5)
Eosinophils Relative: 0 %
HCT: 38.5 % (ref 36.0–46.0)
HEMOGLOBIN: 12.9 g/dL (ref 12.0–15.0)
Immature Granulocytes: 1 %
Lymphocytes Relative: 14 %
Lymphs Abs: 0.9 10*3/uL (ref 0.7–4.0)
MCH: 28.9 pg (ref 26.0–34.0)
MCHC: 33.5 g/dL (ref 30.0–36.0)
MCV: 86.3 fL (ref 80.0–100.0)
Monocytes Absolute: 0.1 10*3/uL (ref 0.1–1.0)
Monocytes Relative: 1 %
Neutro Abs: 5.2 10*3/uL (ref 1.7–7.7)
Neutrophils Relative %: 84 %
Platelets: 311 10*3/uL (ref 150–400)
RBC: 4.46 MIL/uL (ref 3.87–5.11)
RDW: 15.5 % (ref 11.5–15.5)
WBC: 6.2 10*3/uL (ref 4.0–10.5)
nRBC: 0 % (ref 0.0–0.2)

## 2018-02-12 LAB — HEMOGLOBIN A1C
Hgb A1c MFr Bld: 5.4 % (ref 4.8–5.6)
Mean Plasma Glucose: 108.28 mg/dL

## 2018-02-12 MED ORDER — SODIUM CHLORIDE 0.9 % IV SOLN
750.0000 mg | Freq: Once | INTRAVENOUS | Status: AC
  Administered 2018-02-12: 750 mg via INTRAVENOUS
  Filled 2018-02-12: qty 75

## 2018-02-12 MED ORDER — SODIUM CHLORIDE 0.9 % IV SOLN
Freq: Once | INTRAVENOUS | Status: AC
  Administered 2018-02-12: 09:00:00 via INTRAVENOUS
  Filled 2018-02-12: qty 250

## 2018-02-12 MED ORDER — SODIUM CHLORIDE 0.9 % IV SOLN
116.6667 mg/m2 | Freq: Once | INTRAVENOUS | Status: AC
  Administered 2018-02-12: 228 mg via INTRAVENOUS
  Filled 2018-02-12: qty 38

## 2018-02-12 MED ORDER — SODIUM CHLORIDE 0.9 % IV SOLN
Freq: Once | INTRAVENOUS | Status: AC
  Administered 2018-02-12: 10:00:00 via INTRAVENOUS
  Filled 2018-02-12: qty 5

## 2018-02-12 MED ORDER — PALONOSETRON HCL INJECTION 0.25 MG/5ML
0.2500 mg | Freq: Once | INTRAVENOUS | Status: AC
  Administered 2018-02-12: 0.25 mg via INTRAVENOUS

## 2018-02-12 MED ORDER — PALONOSETRON HCL INJECTION 0.25 MG/5ML
INTRAVENOUS | Status: AC
  Filled 2018-02-12: qty 5

## 2018-02-12 MED ORDER — SODIUM CHLORIDE 0.9% FLUSH
10.0000 mL | Freq: Once | INTRAVENOUS | Status: AC
  Administered 2018-02-12: 10 mL
  Filled 2018-02-12: qty 10

## 2018-02-12 MED ORDER — DIPHENHYDRAMINE HCL 50 MG/ML IJ SOLN
INTRAMUSCULAR | Status: AC
  Filled 2018-02-12: qty 1

## 2018-02-12 MED ORDER — FAMOTIDINE IN NACL 20-0.9 MG/50ML-% IV SOLN
20.0000 mg | Freq: Once | INTRAVENOUS | Status: AC
  Administered 2018-02-12: 20 mg via INTRAVENOUS

## 2018-02-12 MED ORDER — SODIUM CHLORIDE 0.9% FLUSH
10.0000 mL | INTRAVENOUS | Status: DC | PRN
  Administered 2018-02-12: 10 mL
  Filled 2018-02-12: qty 10

## 2018-02-12 MED ORDER — FAMOTIDINE IN NACL 20-0.9 MG/50ML-% IV SOLN
INTRAVENOUS | Status: AC
  Filled 2018-02-12: qty 50

## 2018-02-12 MED ORDER — HEPARIN SOD (PORK) LOCK FLUSH 100 UNIT/ML IV SOLN
500.0000 [IU] | Freq: Once | INTRAVENOUS | Status: AC | PRN
  Administered 2018-02-12: 500 [IU]
  Filled 2018-02-12: qty 5

## 2018-02-12 MED ORDER — DIPHENHYDRAMINE HCL 50 MG/ML IJ SOLN
50.0000 mg | Freq: Once | INTRAMUSCULAR | Status: AC
  Administered 2018-02-12: 50 mg via INTRAVENOUS

## 2018-02-12 NOTE — Assessment & Plan Note (Signed)
She will complete cycle 6 of treatment today I will touch base with GYN oncologist regarding imaging study and follow-up Her recent tumor marker has improved She is not symptomatic

## 2018-02-12 NOTE — Assessment & Plan Note (Signed)
She has significant steroid-induced hyperglycemia Her blood sugar monitoring has been within normal limits without chemo We will continue dietary modification while on treatment

## 2018-02-12 NOTE — Progress Notes (Signed)
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 We will continue with mildly reduced dose treatment  Steroid-induced hyperglycemia She has significant steroid-induced hyperglycemia Her blood sugar monitoring has been within normal limits without chemo We will continue dietary modification while on treatment  Chemotherapy-induced nausea Recommend antiemetics as needed.   No orders of the defined types were placed in this encounter.   INTERVAL HISTORY: Please see below for problem oriented charting. She is seen prior to cycle 6 of treatment She feels well Denies worsening peripheral neuropathy She has mild nausea, stable Her blood sugar monitoring in between treatment has been within normal limits No recent infection, fever or chills  SUMMARY OF ONCOLOGIC HISTORY: Oncology History   Serous, endometrioid with foci of squamous differentiation Negative genetic testing     Ovarian cancer on left ([place])   08/13/2017 Initial Diagnosis    The patient reports a history of having seen the emergency room in [place] in May or June 2019 when she developed left lower quadrant pain.  During the ER visit an ultrasound performed which identified an adnexal mass on the left, with no signs of torsion.  She was then seen in the offices of Dr. Barrie Dunker in June 2019 and a plan was made to proceed with surgical removal of the adnexa    08/23/2017 Surgery    On August 23, 2017 she was taken to the operating room for a laparoscopic left salpingo-oophorectomy.  Review of the operative  note suggest that intraoperative findings were significant for a 6 cm left adnexal mass that was stuck in the cul-de-sac with some paraovarian adhesions noted.  It did not appear to be malignant in appearance to the surgeons at the time of surgery.  The upper abdomen, diaphragms, and omentum were commented on as looking normal.  There was no ascites.  The left tube and ovary was removed, however there was some fragmentation of the specimen during removal, and therefore the left tube and ovary were sent as 2 specimens.  The first specimen is labeled ovary biopsy for frozen section which revealed serous carcinoma and the second specimen is labeled left ovary on permanent this revealed serous carcinoma.  The operative note reports that the intraoperative diagnosis was borderline tumor, or reviewing the pathology report it states that the intraoperative diagnosis was left ovarian biopsy epithelial neoplasm at least borderline, cannot exclude carcinoma.  The frozen section of the second specimen was the same (epithelial neoplasm at least borderline cannot exclude carcinoma.).  The patient was seen back in the office by Dr. Barrie Dunker on September 06, 2017 and delivered her diagnosis of carcinoma.  At that time she was referred for consultation with gynecologic oncology.    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 No evidence of thoracic metastasis. 2. Small angular nodule in the RIGHT middle lobe is favored benign.  Abdomen / Pelvis Impression:  1. No evidence of local recurrence of ovarian carcinoma. 2. No evidence of metastatic disease in the abdomen pelvis. 3. No  free fluid    09/25/2017 Tumor Marker    Patient's tumor was tested for the following markers: CA-125 Results of the tumor marker test revealed 66.4    10/02/2017 Pathology Results    1. Omentum, resection other than for tumor - NO CARCINOMA IDENTIFIED 2. Uterus +/-  tubes/ovaries, neoplastic, right fallopian tube and ovary - CARCINOMA INVOLVING SEROSA OF THE UTERUS AND ENDOMETRIUM - NO EVIDENCE OF CARCINOMA IN THE CERVIX, RIGHT OVARY OR FALLOPIAN TUBE - LEIOMYOMATA (2 CM; LARGEST) - SEE COMMENT 3. Lymph nodes, regional resection, right pelvic - NO CARCINOMA IDENTIFIED IN NINE LYMPH NODES (0/9) 4. Lymph nodes, regional resection, left pelvic - METASTATIC CARCINOMA INVOLVING ONE OF FIVE NODES - SEE COMMENT Microscopic Comment 2. The left lateral aspect of the uterine serosa has carcinoma which appears endometrioid with foci of squamous differentiation; serous features are not seen. There are two focal areas of endometrioid carcinoma confined to the endometrium. Based on the size of the left ovarian lesion, left pelvic node involvement and minimal involvement of the endometrium, this is favored to be a primary ovarian with uterine involvement. However, given the different histologic subtyping of these lesions, it would be beneficial to review the current case in conjunction with the patient's recently diagnosed ovarian serous carcinoma for accurate staging. This case was discussed with Dr. Denman George on October 05, 2017. Dr. Lyndon Code reviewed the case and agrees with the above diagnosis. 4. Cytokeratin AE1/3 was utilized and highlights isolated tumor cells and a focus of tumor with similar histology as that seen on the serosal surface of the endometrium.    10/02/2017 Pathology Results    PERITONEAL WASHING(SPECIMEN 1 OF 1 COLLECTED 10/02/17): ATYPICAL CELLS. SEE COMMENT.    10/02/2017 Surgery    Surgeon: Donaciano Eva   Operation: Robotic-assisted laparoscopic total hysterectomy with bilateral salpingoophorectomy, omentectomy, bilateral pelvic lymphadenectomy  Surgeon: Donaciano Eva  Operative Findings:  : 6cm grossly normal uterus (with pedunculated 1cm fibroid), normal appearing right tube and ovary. There was a thin layer of apparent residual left  ovary adherent to the uterus/left ovarian fossa (completely resected with this surgery). No ascites, normal appendix and small bowel. Normal appearing omentum. No suspicious nodes.  No residual visible cancer remaining at the completion of the surgery.          Specimens: washings, omentum, uterus with cervix and right tube and ovary, right and left pelvic lymph nodes.     10/16/2017 Cancer Staging    Staging form: Ovary, Fallopian Tube, and Primary Peritoneal Carcinoma, AJCC 8th Edition - Pathologic: FIGO Stage III (pT3, pN1, cM0) - Signed by Heath Lark, MD on 10/17/2017    10/18/2017 Genetic Testing    Negative genetic testing on the [facility] panel.  The [facility] gene panel offered by [employer] includes sequencing and deletion/duplication testing of the following 35 genes: APC, ATM, AXIN2, BARD1, BMPR1A, BRCA1, BRCA2, BRIP1, CHD1, CDK4, CDKN2A, CHEK2, EPCAM (large rearrangement only), HOXB13, (sequencing only), GALNT12, MLH1, MSH2, MSH3 (excluding repetitive portions of exon 1), MSH6, MUTYH, NBN, NTHL1, PALB2, PMS2, PTEN, RAD51C, RAD51D, RNF43, RPS20, SMAD4, STK11, and TP53. Sequencing was performed for select regions of POLE and POLD1, and large rearrangement analysis was performed for select regions of GREM1. The report date is October 18, 2017.    10/24/2017 Procedure    Ultrasound and fluoroscopically guided right internal jugular single lumen power port catheter insertion. Tip in the SVC/RA junction. Catheter ready for use.    10/25/2017 Tumor Marker  Patient's tumor was tested for the following markers: CA-125 Results of the tumor marker test revealed 24.2    10/26/2017 - 02/12/2018 Chemotherapy    The patient had carboplatin and taxol    11/19/2017 Tumor Marker    Patient's tumor was tested for the following markers: CA-125 Results of the tumor marker test revealed 15.5    01/21/2018 Tumor Marker    Patient's tumor was tested for the following markers: CA-125 Results  of the tumor marker test revealed 10.7     REVIEW OF SYSTEMS:   Constitutional: Denies fevers, chills or abnormal weight loss Eyes: Denies blurriness of vision Ears, nose, mouth, throat, and face: Denies mucositis or sore throat Respiratory: Denies cough, dyspnea or wheezes Cardiovascular: Denies palpitation, chest discomfort or lower extremity swelling Skin: Denies abnormal skin rashes Lymphatics: Denies new lymphadenopathy or easy bruising Neurological:Denies numbness, tingling or new weaknesses Behavioral/Psych: Mood is stable, no new changes  All other systems were reviewed with the patient and are negative.  I have reviewed the past medical history, past surgical history, social history and family history with the patient and they are unchanged from previous note.  ALLERGIES:  is allergic to other.  MEDICATIONS:  Current Outpatient Medications  Medication Sig Dispense Refill  . acetaminophen (TYLENOL) 500 MG tablet Take 2 tablets (1,000 mg total) by mouth every 6 (six) hours. 30 tablet 0  . dexamethasone (DECADRON) 4 MG tablet Take 5 tabs at the night before and 5 tab the morning of chemotherapy, every 3 weeks, by mouth (Patient taking differently: 8 mg. Take 2 tabs at the night before and 2 tab the morning of chemotherapy, every 3 weeks, by mouth) 60 tablet 0  . ibuprofen (ADVIL,MOTRIN) 200 MG tablet Take 200 mg by mouth daily as needed for headache or moderate pain.    Marland Kitchen lidocaine-prilocaine (EMLA) cream Apply to affected area once 30 g 3  . lisinopril (PRINIVIL,ZESTRIL) 10 MG tablet Take 10 mg by mouth daily.   1  . mometasone (ELOCON) 0.1 % cream Apply 1 application topically 2 (two) times daily as needed for rash.  12  . ondansetron (ZOFRAN) 8 MG tablet Take 1 tablet (8 mg total) by mouth every 8 (eight) hours as needed for refractory nausea / vomiting. Start on day 3 after chemo. 30 tablet 1  . oxyCODONE (OXY IR/ROXICODONE) 5 MG immediate release tablet Take 1 tablet (5 mg  total) by mouth every 4 (four) hours as needed for severe pain or breakthrough pain. 30 tablet 0  . prochlorperazine (COMPAZINE) 10 MG tablet Take 1 tablet (10 mg total) by mouth every 6 (six) hours as needed (Nausea or vomiting). 30 tablet 1  . senna-docusate (SENOKOT-S) 8.6-50 MG tablet Take 2 tablets by mouth at bedtime. 30 tablet 0   No current facility-administered medications for this visit.    Facility-Administered Medications Ordered in Other Visits  Medication Dose Route Frequency Provider Last Rate Last Dose  . CARBOplatin (PARAPLATIN) 750 mg in sodium chloride 0.9 % 250 mL chemo infusion  750 mg Intravenous Once Alvy Bimler, Court Gracia, MD      . heparin lock flush 100 unit/mL  500 Units Intracatheter Once PRN Alvy Bimler, Estaban [place], MD      . PACLitaxel (TAXOL) 228 mg in sodium chloride 0.9 % 250 mL chemo infusion (> 23m/m2)  1250.5397mg/m2 (Treatment Plan Recorded) Intravenous Once FTruitt Merle MD 96 mL/hr at 02/12/18 1100 228 mg at 02/12/18 1100  . sodium chloride flush (NS) 0.9 % injection 10 mL  10 mL Intracatheter PRN Heath Lark, MD        PHYSICAL EXAMINATION: ECOG PERFORMANCE STATUS: 1 - Symptomatic but completely ambulatory GENERAL:alert, no distress and comfortable SKIN: skin color, texture, turgor are normal, no rashes or significant lesions EYES: normal, Conjunctiva are pink and non-injected, sclera clear OROPHARYNX:no exudate, no erythema and lips, buccal mucosa, and tongue normal  NECK: supple, thyroid normal size, non-tender, without nodularity LYMPH:  no palpable lymphadenopathy in the cervical, axillary or inguinal LUNGS: clear to auscultation and percussion with normal breathing effort HEART: regular rate & rhythm and no murmurs and no lower extremity edema ABDOMEN:abdomen soft, non-tender and normal bowel sounds Musculoskeletal:no cyanosis of digits and no clubbing  NEURO: alert & oriented x 3 with fluent speech, no focal motor/sensory deficits  LABORATORY DATA:  I have reviewed  the data as listed    Component Value Date/Time   NA 141 02/12/2018 0849   K 3.9 02/12/2018 0849   CL 106 02/12/2018 0849   CO2 23 02/12/2018 0849   GLUCOSE 213 (H) 02/12/2018 0849   BUN 10 02/12/2018 0849   CREATININE 0.69 02/12/2018 0849   CREATININE 0.67 09/18/2017 1234   CALCIUM 10.3 02/12/2018 0849   PROT 7.7 02/12/2018 0849   ALBUMIN 4.2 02/12/2018 0849   AST 30 02/12/2018 0849   ALT 42 02/12/2018 0849   ALKPHOS 89 02/12/2018 0849   BILITOT 0.2 (L) 02/12/2018 0849   GFRNONAA >60 02/12/2018 0849   GFRNONAA >60 09/18/2017 1234   GFRAA >60 02/12/2018 0849   GFRAA >60 09/18/2017 1234    No results found for: SPEP, UPEP  Lab Results  Component Value Date   WBC 6.2 02/12/2018   NEUTROABS 5.2 02/12/2018   HGB 12.9 02/12/2018   HCT 38.5 02/12/2018   MCV 86.3 02/12/2018   PLT 311 02/12/2018      Chemistry      Component Value Date/Time   NA 141 02/12/2018 0849   K 3.9 02/12/2018 0849   CL 106 02/12/2018 0849   CO2 23 02/12/2018 0849   BUN 10 02/12/2018 0849   CREATININE 0.69 02/12/2018 0849   CREATININE 0.67 09/18/2017 1234      Component Value Date/Time   CALCIUM 10.3 02/12/2018 0849   ALKPHOS 89 02/12/2018 0849   AST 30 02/12/2018 0849   ALT 42 02/12/2018 0849   BILITOT 0.2 (L) 02/12/2018 0849      All questions were answered. The patient knows to call the clinic with any problems, questions or concerns. No barriers to learning was detected.  I spent 15 minutes counseling the patient face to face. The total time spent in the appointment was 20 minutes and more than 50% was on counseling and review of test results  Heath Lark, MD 02/12/2018 11:22 AM

## 2018-02-12 NOTE — Assessment & Plan Note (Signed)
Recommend antiemetics as needed.

## 2018-02-12 NOTE — Assessment & Plan Note (Signed)
she has mild peripheral neuropathy, likely related to side effects of treatment. We will continue with mildly reduced dose treatment

## 2018-02-12 NOTE — Telephone Encounter (Signed)
Per 12/17 los, return for no new orders.

## 2018-02-12 NOTE — Patient Instructions (Signed)
   [facility] Discharge Instructions for Patients Receiving Chemotherapy  Today you received the following chemotherapy agents Taxol and Carboplatin   To help prevent nausea and vomiting after your treatment, we encourage you to take your nausea medication as directed.    If you develop nausea and vomiting that is not controlled by your nausea medication, call the clinic.   BELOW ARE SYMPTOMS THAT SHOULD BE REPORTED IMMEDIATELY:  *FEVER GREATER THAN 100.5 F  *CHILLS WITH OR WITHOUT FEVER  NAUSEA AND VOMITING THAT IS NOT CONTROLLED WITH YOUR NAUSEA MEDICATION  *UNUSUAL SHORTNESS OF BREATH  *UNUSUAL BRUISING OR BLEEDING  TENDERNESS IN MOUTH AND THROAT WITH OR WITHOUT PRESENCE OF ULCERS  *URINARY PROBLEMS  *BOWEL PROBLEMS  UNUSUAL RASH Items with * indicate a potential emergency and should be followed up as soon as possible.  Feel free to call the clinic should you have any questions or concerns. The clinic phone number is [phone].  Please show the CHEMO ALERT CARD at check-in to the Emergency Department and triage nurse.   

## 2018-02-12 NOTE — Progress Notes (Signed)
MD saw pt in infusion. Ok to treat.  Hardie Pulley, PharmD, BCPS, BCOP

## 2018-02-13 ENCOUNTER — Telehealth: Payer: Self-pay | Admitting: *Deleted

## 2018-02-13 LAB — CA 125: Cancer Antigen (CA) 125: 9.3 U/mL (ref 0.0–38.1)

## 2018-02-13 NOTE — Telephone Encounter (Signed)
Patient called back and scheduled an appt for 1/8

## 2018-02-13 NOTE — Telephone Encounter (Signed)
Called and left a message for the patient to call the office back. Need to schedule the patient an appt to see Dr. Denman George in January

## 2018-03-06 ENCOUNTER — Encounter: Payer: Self-pay | Admitting: Gynecologic Oncology

## 2018-03-06 ENCOUNTER — Inpatient Hospital Stay: Payer: Self-pay | Attending: Gynecologic Oncology | Admitting: Gynecologic Oncology

## 2018-03-06 VITALS — BP 136/51 | HR 73 | Temp 98.1°F | Resp 20 | Ht 63.0 in | Wt 177.4 lb

## 2018-03-06 DIAGNOSIS — Z90710 Acquired absence of both cervix and uterus: Secondary | ICD-10-CM | POA: Insufficient documentation

## 2018-03-06 DIAGNOSIS — Z9221 Personal history of antineoplastic chemotherapy: Secondary | ICD-10-CM | POA: Insufficient documentation

## 2018-03-06 DIAGNOSIS — Z90722 Acquired absence of ovaries, bilateral: Secondary | ICD-10-CM | POA: Insufficient documentation

## 2018-03-06 DIAGNOSIS — C562 Malignant neoplasm of left ovary: Secondary | ICD-10-CM | POA: Insufficient documentation

## 2018-03-06 NOTE — Progress Notes (Signed)
Follow-up Note: Gyn-Onc  Consult was intially requested by Dr. Barrie Dunker for the evaluation of Kristy Franklin 48 y.o. female  CC:  Chief Complaint  Patient presents with  . Malignant neoplasm of left ovary (HCC)   Seen with translator (spanish)  Assessment/Plan:  [name]  is a 48 y.o.  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 BRCA negative.  S/p completion of chemotherapy with 6 cycles adjuvant carboplatin and paclitaxel in December, 2019.  No apparent recurrence though she has abdominal pains - will order CT scan to document no residual/active measurable disease.  Will see her back at 3 monthly intervals with CA 125.   HPI: [name] is a 48 year old P2 who is seen in consultation at the request of Dr Barrie Dunker for high grade serous ovarian cancer, clinical stage IC.  The patient reports a history of having seen the emergency room in [place] in May or June 2019 when she developed left lower quadrant pain.  During the ER visit an ultrasound performed which identified an adnexal mass on the left, with no signs of torsion.  She was then seen in the offices of Dr. Barrie Dunker in June 2019 and a plan was made to proceed with surgical removal of the adnexa.  On August 23, 2017 she was taken to the operating room for a laparoscopic left salpingo-oophorectomy.  Review of the operative note suggest that intraoperative findings were significant for a 6 cm left adnexal mass that was stuck in the cul-de-sac with some paraovarian adhesions noted.  It did not appear to be malignant in appearance to the surgeons at the time of surgery.  The upper abdomen, diaphragms, and omentum were commented on as looking normal.  There was no ascites.  The left tube and ovary was removed, however there was some fragmentation of the specimen during removal, and therefore the left tube and ovary were sent as 2  specimens.  The first specimen is labeled ovary biopsy for frozen section which revealed serous carcinoma and the second specimen is labeled left ovary on permanent this revealed serous carcinoma.  The operative note reports that the intraoperative diagnosis was borderline tumor, or reviewing the pathology report it states that the intraoperative diagnosis was left ovarian biopsy epithelial neoplasm at least borderline, cannot exclude carcinoma.  The frozen section of the second specimen was the same (epithelial neoplasm at least borderline cannot exclude carcinoma.).  The patient was seen back in the office by Dr. Barrie Dunker on September 06, 2017 and delivered her diagnosis of carcinoma.  At that time she was referred for consultation with gynecologic oncology.  Her past medical history 6 significant for obesity and hypertension.  She has had 2 prior cesarean deliveries.  She does not desire future fertility as she is completed childbearing.  She has no history of abnormal Pap smears and her last Pap was 7 to 8 months ago was normal.  Her past surgical history is significant for 2 cesarean sections and a laparoscopic cholecystectomy.  Her only family history is significant for father with prostate cancer at age 77.  On 10/02/17 she underwent robotic assisted total hysterectomy, RSO, omentectomy, bilateral pelvic lymphadenectomy (PA node dissection not feasible due to body habitus, and no suspicious nodes on preop imaging). Final pathology revealed residual carcinoma on the surface of the uterus (serosa) and separate additional endometrioid carcinoma in endometrium (stage I, thought to be second primary).  Metastatic carcinoma (consistent with serous) in one of 5 left pelvic lymph nodes (right side all benign). Omentum was benign (though atypical cells were identified in peritoneal washings).  CT chest/abd/pelvis on 09/24/17 (prior to staging surgery) showed no evidence of gross metastatic disease.  She was determined  to most likely have synchronous endometrial (stage I) and ovarian (stage IIIC) cancers of endometrioid and high grade serous histologies respectively after pathology and multidisciplinary conference review. Recommendation was for 6 cycles of adjuvant carboplatin and paclitaxel chemotherapy with genetics consultation.  She commenced chemotherapy with Dr Alvy Bimler on 10/26/17.  BRCA and HRD testing negative.   Interval Hx:  She completed 6 cycles of adjuvant carboplatin paclitaxel chemotherapy on February 12, 2018.  Posttreatment Ca1 25 was normal at 9.3.  She began developing mid right abdominal wall discomfort in November December 2019.  Current Meds:  Outpatient Encounter Medications as of 03/06/2018  Medication Sig  . acetaminophen (TYLENOL) 500 MG tablet Take 2 tablets (1,000 mg total) by mouth every 6 (six) hours.  Marland Kitchen ibuprofen (ADVIL,MOTRIN) 200 MG tablet Take 200 mg by mouth daily as needed for headache or moderate pain.  Marland Kitchen lidocaine-prilocaine (EMLA) cream Apply to affected area once  . lisinopril (PRINIVIL,ZESTRIL) 10 MG tablet Take 10 mg by mouth daily.   . mometasone (ELOCON) 0.1 % cream Apply 1 application topically 2 (two) times daily as needed for rash.  . ondansetron (ZOFRAN) 8 MG tablet Take 1 tablet (8 mg total) by mouth every 8 (eight) hours as needed for refractory nausea / vomiting. Start on day 3 after chemo.  Marland Kitchen oxyCODONE (OXY IR/ROXICODONE) 5 MG immediate release tablet Take 1 tablet (5 mg total) by mouth every 4 (four) hours as needed for severe pain or breakthrough pain.  Marland Kitchen senna-docusate (SENOKOT-S) 8.6-50 MG tablet Take 2 tablets by mouth at bedtime.  . prochlorperazine (COMPAZINE) 10 MG tablet Take 1 tablet (10 mg total) by mouth every 6 (six) hours as needed (Nausea or vomiting). (Patient not taking: Reported on 03/06/2018)  . [DISCONTINUED] dexamethasone (DECADRON) 4 MG tablet Take 5 tabs at the night before and 5 tab the morning of chemotherapy, every 3 weeks, by mouth  (Patient not taking: Reported on 03/06/2018)   No facility-administered encounter medications on file as of 03/06/2018.     Allergy:  Allergies  Allergen Reactions  . Other Rash    Pork    Social Hx:   Social History   Socioeconomic History  . Marital status: Married    Spouse name: Media planner  . Number of children: 2  . Years of education: Not on file  . Highest education level: Not on file  Occupational History  . Occupation: unemployed  Social Needs  . Financial resource strain: Not on file  . Food insecurity:    Worry: Not on file    Inability: Not on file  . Transportation needs:    Medical: Not on file    Non-medical: Not on file  Tobacco Use  . Smoking status: Never Smoker  . Smokeless tobacco: Never Used  Substance and Sexual Activity  . Alcohol use: Never    Frequency: Never  . Drug use: Never  . Sexual activity: Not Currently    Comment: calendar  Lifestyle  . Physical activity:    Days per week: Not on file    Minutes per session: Not on file  . Stress: Not on file  Relationships  . Social connections:    Talks on phone: Not on file  Gets together: Not on file    Attends religious service: Not on file    Active member of club or organization: Not on file    Attends meetings of clubs or organizations: Not on file    Relationship status: Not on file  . Intimate partner violence:    Fear of current or ex partner: Not on file    Emotionally abused: Not on file    Physically abused: Not on file    Forced sexual activity: Not on file  Other Topics Concern  . Not on file  Social History Narrative  . Not on file    Past Surgical Hx:  Past Surgical History:  Procedure Laterality Date  . CESAREAN SECTION     x2  . CHOLECYSTECTOMY     20 years ago  . IR IMAGING GUIDED PORT INSERTION  10/24/2017  . OMENTECTOMY N/A 10/02/2017   Procedure: OMENTECTOMY;  Surgeon: Everitt Amber, MD;  Location: [facility];  Service: Gynecology;  Laterality: N/A;  . OTHER  SURGICAL HISTORY  07/2017   Left ovary removed  . ROBOTIC ASSISTED TOTAL HYSTERECTOMY WITH BILATERAL SALPINGO OOPHERECTOMY Right 10/02/2017   Procedure: XI ROBOTIC ASSISTED TOTAL HYSTERECTOMY WITH RIGHT SALPINGO OOPHORECTOMY;  Surgeon: Everitt Amber, MD;  Location: [facility];  Service: Gynecology;  Laterality: Right;  . ROBOTIC PELVIC AND PARA-AORTIC LYMPH NODE DISSECTION Bilateral 10/02/2017   Procedure: XI ROBOTIC PELVIC LYMPH NODE DISSECTION;  Surgeon: Everitt Amber, MD;  Location: [facility];  Service: Gynecology;  Laterality: Bilateral;    Past Medical Hx:  Past Medical History:  Diagnosis Date  . Abdominal pain    Right mid only with bowel movements  . Blood in urine   . Change in bowel movement    more green color  . Family history of prostate cancer   . GERD (gastroesophageal reflux disease)   . Headache    after menstrual cycle every month  . History of gallstones   . Hypertension   . Neck pain on right side    pulsating  . Numbness and tingling of right leg   . Obesity   . Ovarian cancer, left ([place])   . PPD positive   . Skin rash    Chest, forehead, left arm    Past Gynecological History:  C/s x 2. No history of abnormal paps.  Patient's last menstrual period was 09/24/2017.  Family Hx:  Family History  Problem Relation Age of Onset  . Hypertension Mother   . Prostate cancer Father 73       d. 36  . Diabetes Paternal Aunt   . Heart attack Paternal Aunt   . Heart attack Paternal Grandmother 95  . Thyroid disease Other 14    Review of Systems:  Constitutional  Feels well,    ENT Normal appearing ears and nares bilaterally Skin/Breast  No rash, sores, jaundice, itching, dryness Cardiovascular  No chest pain, shortness of breath, or edema  Pulmonary  No cough or wheeze.  Gastro Intestinal  No nausea, vomitting, or diarrhoea. No bright red blood per rectum, change in bowel movement, or constipation. + abdominal pain for 2 months. Intermittent Genito Urinary  No  frequency, urgency, dysuria, no pain, no bleeding. Musculo Skeletal  No myalgia, arthralgia, joint swelling or pain  Neurologic  No weakness, numbness, change in gait,  Psychology  No depression, anxiety, insomnia.   Vitals:  Blood pressure (!) 136/51, pulse 73, temperature 98.1 F (36.7 C), temperature source Oral, resp. rate 20, height 5'  3" (1.6 m), weight 177 lb 6.4 oz (80.5 kg), last menstrual period 09/24/2017, SpO2 100 %.  Physical Exam: WD in NAD Neck  Supple NROM, without any enlargements.  Lymph Node Survey No cervical supraclavicular or inguinal adenopathy Cardiovascular  Pulse normal rate, regularity and rhythm. S1 and S2 normal.  Lungs  Clear to auscultation bilateraly, without wheezes/crackles/rhonchi. Good air movement.  Skin  No rash/lesions/breakdown  Psychiatry  Alert and oriented to person, place, and time  Abdomen  Normoactive bowel sounds, abdomen soft, non-tender and obese without evidence of hernia. Incisions well healed, there is some subtle nodularity appreciated in the right mid abdominal wall, corresponds to patient's discomfort, does not correspond to incision sites. Back No CVA tenderness Genito Urinary  Vaginal cuff in tact, well healed, no blood, no masses  Rectal  deferred Extremities  No bilateral cyanosis, clubbing or edema.    Thereasa Solo, MD  03/06/2018, 3:50 PM

## 2018-03-06 NOTE — Patient Instructions (Signed)
Dr Denman George has ordered a CT scan to rule out cancer as a source of your pain.  Dr Denman George recommends returning for follow-up visits in 71months with lab testing.   Please notify Dr Denman George at phone number [phone] if you notice vaginal bleeding, new pelvic or abdominal pains, bloating, feeling full easy, or a change in bladder or bowel function.

## 2018-03-13 ENCOUNTER — Ambulatory Visit (HOSPITAL_COMMUNITY)
Admission: RE | Admit: 2018-03-13 | Discharge: 2018-03-13 | Disposition: A | Discharge status: Home / Self Care | Payer: Self-pay | Source: Ambulatory Visit | Attending: Gynecologic Oncology | Admitting: Gynecologic Oncology

## 2018-03-13 DIAGNOSIS — C562 Malignant neoplasm of left ovary: Secondary | ICD-10-CM | POA: Insufficient documentation

## 2018-03-13 MED ORDER — SODIUM CHLORIDE (PF) 0.9 % IJ SOLN
INTRAMUSCULAR | Status: AC
  Filled 2018-03-13: qty 50

## 2018-03-13 MED ORDER — IOHEXOL 300 MG/ML  SOLN
100.0000 mL | Freq: Once | INTRAMUSCULAR | Status: AC | PRN
  Administered 2018-03-13: 100 mL via INTRAVENOUS

## 2018-03-14 ENCOUNTER — Telehealth: Payer: Self-pay

## 2018-03-14 NOTE — Telephone Encounter (Signed)
Spoke with Kristy Franklin regarding the results of the CT scan as noted below by Dr. Denman George. Used [employer]. Kristy Franklin's pain level is a 5-6/10 especially when she is sitting in the left pelvis. She takes 2 Lipoprotein in the am.  Her pain level is a 2/10 while sitting/standing for ~6 hrs.. Pt has not wanted to take more medication as she is concerned about toxicity. Reviewed with Joylene John, NP. Told Kristy Franklin that she could take 3 ibuprofen twice a day and see how this affects her discomfort. She can think about the procedure and call the office back and let us know if she wants to proceed with IR referral. Pt will talk with husband and call the office next week with her decision. Did review that that the fluid could re accumulate  after drainage. A drain may need to be left in place for several days or week or two if the cyst is not completely drained with initial entry.  There is a chance of infection with procedure.   Giving information of some possible scenarios for patient to help in deciding on pursuing the drainage of the lymphocyst.

## 2018-03-14 NOTE — Telephone Encounter (Signed)
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 If she is interested, we could see if IR could try to drain this. It is not medically necessary, but if she is having pain symptoms on that side, it might help with her pain. Terrence Dupont ----- Message ----- From: Buel Ream, Rad Results In Sent: 03/13/2018   5:00 PM EST To: Everitt Amber, MD

## 2018-03-25 ENCOUNTER — Other Ambulatory Visit: Payer: Self-pay | Admitting: Gynecologic Oncology

## 2018-03-25 ENCOUNTER — Telehealth: Payer: Self-pay

## 2018-03-25 DIAGNOSIS — R188 Other ascites: Secondary | ICD-10-CM

## 2018-03-25 NOTE — Progress Notes (Signed)
See previous RN note.

## 2018-03-25 NOTE — Telephone Encounter (Signed)
Received call from pt regarding her pain in her back was bad over weekend and she ready to have procedure to drain area as they discussed with her previously.  Notified Joylene John NP.  Order placed for IR- notified IR dept and they are reviewing order, usually receive a call within 2 days.  I called pt back and let her know this and that IR will call her with appt within 2 days. .  Per Melissa NP, pt can try Ibuprofen and heating pad for pain.  Gave her instructions regarding Ibuprofen and heating pad- pt voiced understanding and said she will wait for their call.  No other needs per pt at this time.

## 2018-03-26 ENCOUNTER — Telehealth: Payer: Self-pay

## 2018-03-26 NOTE — Telephone Encounter (Signed)
Outgoing call to pt regarding if she had received call from IR yet. Pt said yes, confirmed with her appt is for 7:15 am, Feb 4th, nothing to eat or drink.  Voiced understanding.  Pt reports she started having some slight redness and itching around her eyes, face, and neck.  Then having rectal itching also. Reports she was concerned that her cancer has returned. Per Joylene John, her CT on Jan 15th says no cancer and unsure if CT contrast would be bothering her now.  Discussed possibility of soap or detergent or new medicine - but did not have any new meds- pt reports she is using regular detergent now where she use to use one that was more sensitive.  Per Joylene John NP- pt can try Benadryl over the counter as long as she is not driving. Also encouraged if not improving, go to urgent care.  Pt voiced understanding. Reports she will try Benadryl and not helping see PCP or urgent care. No other needs at this time.

## 2018-03-28 ENCOUNTER — Ambulatory Visit: Payer: Self-pay | Admitting: Gynecologic Oncology

## 2018-04-01 ENCOUNTER — Other Ambulatory Visit: Payer: Self-pay | Admitting: Physician Assistant

## 2018-04-02 ENCOUNTER — Ambulatory Visit (HOSPITAL_COMMUNITY)
Admission: RE | Admit: 2018-04-02 | Discharge: 2018-04-02 | Disposition: A | Discharge status: Home / Self Care | Payer: PRIVATE HEALTH INSURANCE | Source: Ambulatory Visit | Attending: Gynecologic Oncology | Admitting: Gynecologic Oncology

## 2018-04-02 ENCOUNTER — Encounter (HOSPITAL_COMMUNITY): Payer: Self-pay

## 2018-04-02 DIAGNOSIS — R188 Other ascites: Secondary | ICD-10-CM | POA: Diagnosis present

## 2018-04-02 LAB — CBC
HCT: 39.1 % (ref 36.0–46.0)
Hemoglobin: 12.7 g/dL (ref 12.0–15.0)
MCH: 30 pg (ref 26.0–34.0)
MCHC: 32.5 g/dL (ref 30.0–36.0)
MCV: 92.2 fL (ref 80.0–100.0)
NRBC: 0 % (ref 0.0–0.2)
Platelets: 214 10*3/uL (ref 150–400)
RBC: 4.24 MIL/uL (ref 3.87–5.11)
RDW: 14 % (ref 11.5–15.5)
WBC: 3.7 10*3/uL — ABNORMAL LOW (ref 4.0–10.5)

## 2018-04-02 LAB — PROTIME-INR
INR: 0.94
Prothrombin Time: 12.5 seconds (ref 11.4–15.2)

## 2018-04-02 MED ORDER — SODIUM CHLORIDE 0.9 % IV SOLN
INTRAVENOUS | Status: DC
  Administered 2018-04-02: 08:00:00 via INTRAVENOUS

## 2018-04-02 MED ORDER — MIDAZOLAM HCL 2 MG/2ML IJ SOLN
INTRAMUSCULAR | Status: AC | PRN
  Administered 2018-04-02 (×2): 1 mg via INTRAVENOUS

## 2018-04-02 MED ORDER — LIDOCAINE HCL (PF) 1 % IJ SOLN
INTRAMUSCULAR | Status: AC | PRN
  Administered 2018-04-02: 10 mL

## 2018-04-02 MED ORDER — FENTANYL CITRATE (PF) 100 MCG/2ML IJ SOLN
INTRAMUSCULAR | Status: AC | PRN
  Administered 2018-04-02 (×2): 50 ug via INTRAVENOUS

## 2018-04-02 MED ORDER — MIDAZOLAM HCL 2 MG/2ML IJ SOLN
INTRAMUSCULAR | Status: AC
  Filled 2018-04-02: qty 4

## 2018-04-02 MED ORDER — FENTANYL CITRATE (PF) 100 MCG/2ML IJ SOLN
INTRAMUSCULAR | Status: AC
  Filled 2018-04-02: qty 2

## 2018-04-02 NOTE — H&P (Signed)
Referring Physician(s): Rossi,E  Supervising Physician: Marybelle Killings  Patient Status:  [facility]  Chief Complaint:  Pelvic fluid collection  Subjective: Patient familiar to IR service from prior Port-A-Cath placement on 10/24/2017.  She has a history of stage IIIc high-grade serous left ovarian cancer and synchronous stage I endometrioid endometrial cancer status post left salpingo-oophorectomy on 08/23/2017 and completion hysterectomy, right salpingo-oophorectomy, omentectomy and bilateral pelvic lymphadenectomy on 10/02/2017.  She has had some intermittent abdominal/back pain as well as nausea and follow-up imaging on 03/13/2018 which revealed large lobulated and simple left pelvic sidewall cystic structure, favoring lymphocele.  She presents today for image guided aspiration and possible drainage of the pelvic fluid collection.  She currently denies fever, headache, chest pain, vomiting or abnormal bleeding.  She has had occasional cough.  Past Medical History:  Diagnosis Date  . Abdominal pain    Right mid only with bowel movements  . Blood in urine   . Change in bowel movement    more green color  . Family history of prostate cancer   . GERD (gastroesophageal reflux disease)   . Headache    after menstrual cycle every month  . History of gallstones   . Hypertension   . Neck pain on right side    pulsating  . Numbness and tingling of right leg   . Obesity   . Ovarian cancer, left ([place])   . PPD positive   . Skin rash    Chest, forehead, left arm   Past Surgical History:  Procedure Laterality Date  . CESAREAN SECTION     x2  . CHOLECYSTECTOMY     20 years ago  . IR IMAGING GUIDED PORT INSERTION  10/24/2017  . OMENTECTOMY N/A 10/02/2017   Procedure: OMENTECTOMY;  Surgeon: Everitt Amber, MD;  Location: [facility];  Service: Gynecology;  Laterality: N/A;  . OTHER SURGICAL HISTORY  07/2017   Left ovary removed  . ROBOTIC ASSISTED TOTAL HYSTERECTOMY WITH BILATERAL SALPINGO  OOPHERECTOMY Right 10/02/2017   Procedure: XI ROBOTIC ASSISTED TOTAL HYSTERECTOMY WITH RIGHT SALPINGO OOPHORECTOMY;  Surgeon: Everitt Amber, MD;  Location: [facility];  Service: Gynecology;  Laterality: Right;  . ROBOTIC PELVIC AND PARA-AORTIC LYMPH NODE DISSECTION Bilateral 10/02/2017   Procedure: XI ROBOTIC PELVIC LYMPH NODE DISSECTION;  Surgeon: Everitt Amber, MD;  Location: [facility];  Service: Gynecology;  Laterality: Bilateral;      Allergies: Other  Medications: Prior to Admission medications   Medication Sig Start Date End Date Taking? Authorizing Provider  acetaminophen (TYLENOL) 500 MG tablet Take 2 tablets (1,000 mg total) by mouth every 6 (six) hours. 10/03/17  Yes Everitt Amber, MD  ibuprofen (ADVIL,MOTRIN) 200 MG tablet Take 200 mg by mouth daily as needed for headache or moderate pain.   Yes [provider]  lisinopril (PRINIVIL,ZESTRIL) 10 MG tablet Take 10 mg by mouth daily.  09/10/17  Yes [provider]  mometasone (ELOCON) 0.1 % cream Apply 1 application topically 2 (two) times daily as needed for rash. 08/10/17  Yes [provider]  senna-docusate (SENOKOT-S) 8.6-50 MG tablet Take 2 tablets by mouth at bedtime. 10/03/17  Yes Everitt Amber, MD  lidocaine-prilocaine (EMLA) cream Apply to affected area once 10/25/17   Heath Lark, MD  ondansetron (ZOFRAN) 8 MG tablet Take 1 tablet (8 mg total) by mouth every 8 (eight) hours as needed for refractory nausea / vomiting. Start on day 3 after chemo. 10/25/17   Heath Lark, MD  oxyCODONE (OXY IR/ROXICODONE) 5 MG  immediate release tablet Take 1 tablet (5 mg total) by mouth every 4 (four) hours as needed for severe pain or breakthrough pain. 10/03/17   Everitt Amber, MD  prochlorperazine (COMPAZINE) 10 MG tablet Take 1 tablet (10 mg total) by mouth every 6 (six) hours as needed (Nausea or vomiting). Patient not taking: Reported on 03/06/2018 10/25/17   Heath Lark, MD     Vital Signs: Blood pressure 135/78, heart rate 83, temp 98.5,  respirations 18, O2 sat 99% room air LMP 09/24/2017   Physical Exam awake, alert.  Chest clear to auscultation bilaterally.  Clean, intact right chest wall Port-A-Cath.  Heart with regular rate and rhythm.  Abdomen soft, positive bowel sounds, currently nontender.  No lower extremity edema.  Imaging: No results found.  Labs:  CBC: Recent Labs    12/31/17 0751 01/21/18 0827 02/12/18 0849 04/02/18 0800  WBC 8.9 8.5 6.2 3.7*  HGB 12.9 12.7 12.9 12.7  HCT 40.2 39.1 38.5 39.1  PLT 344 282 311 214    COAGS: Recent Labs    10/24/17 1354 04/02/18 0800  INR 1.03 0.94    BMP: Recent Labs    12/10/17 0809 12/31/17 0751 01/21/18 0827 02/12/18 0849  NA 142 141 141 141  K 3.9 3.8 3.8 3.9  CL 106 106 107 106  CO2 24 23 24 23   GLUCOSE 263* 267* 203* 213*  BUN 12 10 11 10   CALCIUM 10.1 10.3 10.1 10.3  CREATININE 0.62 0.74 0.70 0.69  GFRNONAA >60 >60 >60 >60  GFRAA >60 >60 >60 >60    LIVER FUNCTION TESTS: Recent Labs    12/10/17 0809 12/31/17 0751 01/21/18 0827 02/12/18 0849  BILITOT 0.2* 0.2* 0.3 0.2*  AST 46* 33 29 30  ALT 47* 46* 43 42  ALKPHOS 80 85 81 89  PROT 7.8 7.7 7.6 7.7  ALBUMIN 4.2 4.1 4.2 4.2    Assessment and Plan: Pt with history of stage IIIc high-grade serous left ovarian cancer and synchronous stage I endometrioid endometrial cancer status post left salpingo-oophorectomy on 08/23/2017 and completion hysterectomy, right salpingo-oophorectomy, omentectomy and bilateral pelvic lymphadenectomy on 10/02/2017.  She has had some intermittent abdominal/back pain as well as nausea and follow-up imaging on 03/13/2018 which revealed large lobulated and simple left pelvic sidewall cystic structure, favoring lymphocele.  She presents today for image guided aspiration and possible drainage of the pelvic fluid collection. Risks and benefits discussed with the patient/spouse via interpreter including bleeding, infection, damage to adjacent structures, bowel  perforation/fistula connection, and sepsis.  All of the patient's questions were answered, patient is agreeable to proceed. Consent signed and in chart.     Electronically Signed: D. Rowe Robert, PA-C 04/02/2018, 8:44 AM   I spent a total of 25 minutes  at the the patient's bedside AND on the patient's hospital floor or unit, greater than 50% of which was counseling/coordinating care for image guided aspiration/possible drainage of pelvic fluid collection

## 2018-04-02 NOTE — Progress Notes (Addendum)
Spanish interpreter is at the bedside.  Pre-precedure completed using interpreter.  Husband: Ursula Alert at the bedside.  Confirmed also with pt that husband is driving pt home.  Husband is her driver.

## 2018-04-02 NOTE — Procedures (Signed)
LLQ peritoneal cyst aspiration 8 Fr 200 cc clear yellow fluid EBL 0 Comp 0

## 2018-04-02 NOTE — Discharge Instructions (Signed)
° °  Sedacin consciente [employer] adultos, cuidados posteriores (Moderate Conscious Sedation, Adult, Care After) Estas indicaciones le proporcionan informacin acerca de cmo deber cuidarse despus del procedimiento. El mdico tambin podr darle instrucciones ms especficas. El tratamiento ha sido planificado segn las prcticas mdicas actuales, pero en algunos casos pueden ocurrir problemas. Comunquese con el mdico si tiene algn problema o dudas despus del procedimiento. QU ESPERAR DESPUS DEL PROCEDIMIENTO Despus del procedimiento, es comn:  Sentirse somnoliento durante varias horas.  Sentirse torpe y [employer] de equilibrio durante varias horas.  Perder el sentido de la realidad durante varias horas.  Vomitar si come [employer]. INSTRUCCIONES PARA EL CUIDADO EN EL HOGAR  Durante al menos 24horas despus del procedimiento:  No haga lo siguiente: ? Participar en actividades que impliquen posibles cadas o lesiones. ? Conducir vehculos. ? Operar maquinarias pesadas. ? Beber alcohol. ? Tomar somnferos o medicamentos que causen somnolencia. ? Firmar documentos legales ni tomar [employer]. ? Cuidar a nios por su cuenta.  Hacer reposo. Comida y bebida  Siga la dieta recomendada por el mdico.  Si vomita: ? Pruebe agua, jugo o sopa cuando usted pueda beber sin vomitar. ? Asegrese de no tener nuseas antes de ingerir alimentos slidos. Instrucciones generales  Permanezca con un adulto responsable hasta que est completamente despierto y consciente.  Tome los medicamentos de venta libre y los recetados solamente como se lo haya indicado el mdico.  Si fuma, no lo haga sin supervisin.  Concurra a todas las visitas de control como se lo haya indicado el mdico. Esto es importante. SOLICITE ATENCIN MDICA SI:  Sigue teniendo nuseas o vomitando.  Tiene sensacin de desvanecimiento.  Le aparece una erupcin cutnea.  Tiene  fiebre. SOLICITE ATENCIN MDICA DE INMEDIATO SI:  Tiene dificultad para respirar. Esta informacin no tiene Marine scientist el consejo del mdico. Asegrese de hacerle al mdico cualquier pregunta que tenga. Document Released: 02/18/2013 Document Revised: 03/06/2014 Document Reviewed: 06/05/2015 Elsevier Interactive Patient Education  2019 [place] may remove the dressing after 24 hours, after 10:00 am tomorrow. Then you may shower, you do not have to put anything else back on it.

## 2018-04-03 ENCOUNTER — Telehealth: Payer: Self-pay | Admitting: Hematology and Oncology

## 2018-04-03 NOTE — Telephone Encounter (Signed)
Patient called to schedule  °

## 2018-04-04 ENCOUNTER — Telehealth: Payer: Self-pay

## 2018-04-04 ENCOUNTER — Inpatient Hospital Stay: Payer: PRIVATE HEALTH INSURANCE | Attending: Gynecologic Oncology

## 2018-04-04 DIAGNOSIS — Z452 Encounter for adjustment and management of vascular access device: Secondary | ICD-10-CM | POA: Diagnosis not present

## 2018-04-04 DIAGNOSIS — C562 Malignant neoplasm of left ovary: Secondary | ICD-10-CM

## 2018-04-04 MED ORDER — SODIUM CHLORIDE 0.9% FLUSH
10.0000 mL | Freq: Once | INTRAVENOUS | Status: AC
  Administered 2018-04-04: 10 mL
  Filled 2018-04-04: qty 10

## 2018-04-04 MED ORDER — HEPARIN SOD (PORK) LOCK FLUSH 100 UNIT/ML IV SOLN
500.0000 [IU] | Freq: Once | INTRAVENOUS | Status: AC
  Administered 2018-04-04: 500 [IU]
  Filled 2018-04-04: qty 5

## 2018-04-04 NOTE — Telephone Encounter (Signed)
Outgoing call to patient using pacific interpreter # [phone] per Joylene John NP regarding "see how she feels after having fluid drawn off"   - No answer - left VM for pt to call office.

## 2018-04-05 ENCOUNTER — Telehealth: Payer: Self-pay

## 2018-04-05 ENCOUNTER — Telehealth: Payer: Self-pay | Admitting: *Deleted

## 2018-04-05 NOTE — Telephone Encounter (Signed)
Used pacific interpreter # [phone] per Joylene John NP to "see how she feels after having fluid drawn off" - no answer- left VM to call our office.

## 2018-04-05 NOTE — Telephone Encounter (Signed)
Patient returned our call, patient states that every time the interpreter tries to call her it goes straight to a voicemail.  Patient states that she understands some [place].  Patient verbalizes that she is doing ok after the fluid was taken off, patient denies any pain at this time, and she states that she doesn't seem to be having any problems at this time. Results from the bone marrow report per Joylene John, NP show a collection of lymph fluid which is what they expected to see because they took the lymph node, also no cancer cells, and also no infection. Patient verbalized understanding.

## 2018-04-30 ENCOUNTER — Other Ambulatory Visit: Payer: Self-pay | Admitting: Nurse Practitioner

## 2018-04-30 ENCOUNTER — Other Ambulatory Visit (HOSPITAL_COMMUNITY): Payer: Self-pay | Admitting: Internal Medicine

## 2018-04-30 ENCOUNTER — Other Ambulatory Visit (HOSPITAL_COMMUNITY): Payer: Self-pay | Admitting: Student

## 2018-04-30 ENCOUNTER — Other Ambulatory Visit (HOSPITAL_COMMUNITY): Payer: Self-pay | Admitting: Nurse Practitioner

## 2018-04-30 DIAGNOSIS — R222 Localized swelling, mass and lump, trunk: Secondary | ICD-10-CM

## 2018-04-30 DIAGNOSIS — Z1231 Encounter for screening mammogram for malignant neoplasm of breast: Secondary | ICD-10-CM

## 2018-05-06 ENCOUNTER — Ambulatory Visit (HOSPITAL_COMMUNITY)
Admission: RE | Admit: 2018-05-06 | Discharge: 2018-05-06 | Disposition: A | Discharge status: Home / Self Care | Payer: PRIVATE HEALTH INSURANCE | Source: Ambulatory Visit | Attending: Nurse Practitioner | Admitting: Nurse Practitioner

## 2018-05-06 ENCOUNTER — Ambulatory Visit (HOSPITAL_COMMUNITY)
Admission: RE | Admit: 2018-05-06 | Discharge: 2018-05-06 | Disposition: A | Discharge status: Home / Self Care | Payer: PRIVATE HEALTH INSURANCE | Source: Ambulatory Visit | Attending: Internal Medicine | Admitting: Internal Medicine

## 2018-05-06 DIAGNOSIS — Z1231 Encounter for screening mammogram for malignant neoplasm of breast: Secondary | ICD-10-CM

## 2018-05-06 DIAGNOSIS — R222 Localized swelling, mass and lump, trunk: Secondary | ICD-10-CM | POA: Insufficient documentation

## 2018-05-17 ENCOUNTER — Encounter (INDEPENDENT_AMBULATORY_CARE_PROVIDER_SITE_OTHER): Payer: Self-pay | Admitting: Internal Medicine

## 2018-05-24 ENCOUNTER — Telehealth: Payer: Self-pay

## 2018-05-24 NOTE — Telephone Encounter (Signed)
R/s follow up from 06-14-18 to 08-09-18. Scheduled lab at 0845/flush and visit with Dr. Denman George. Scheduled port flush to 06-20-18 due to low supplies currently with COVID 19. Last flushed 04-04-18. Pt knows to call the office at [phone] if she has any concerns prior to her visit.

## 2018-06-12 ENCOUNTER — Encounter: Payer: Self-pay | Admitting: General Practice

## 2018-06-12 NOTE — Progress Notes (Signed)
[place] [phone]  [facility] [place] Team contacted patient to assess for food insecurity and other psychosocial needs during current COVID19 pandemic.  Left VM, no answer.    Beverely Pace, [place]

## 2018-06-14 ENCOUNTER — Ambulatory Visit: Payer: Self-pay | Admitting: Gynecologic Oncology

## 2018-06-14 ENCOUNTER — Other Ambulatory Visit: Payer: Self-pay

## 2018-06-19 ENCOUNTER — Telehealth: Payer: Self-pay

## 2018-06-19 NOTE — Telephone Encounter (Signed)
Spoke with Gregary Signs, [place] interpreter, to let her know pt has an appt tomorrow at 0930

## 2018-06-20 ENCOUNTER — Inpatient Hospital Stay: Payer: PRIVATE HEALTH INSURANCE | Attending: Gynecologic Oncology

## 2018-06-20 ENCOUNTER — Inpatient Hospital Stay: Payer: PRIVATE HEALTH INSURANCE

## 2018-06-20 ENCOUNTER — Other Ambulatory Visit: Payer: Self-pay

## 2018-06-20 DIAGNOSIS — C562 Malignant neoplasm of left ovary: Secondary | ICD-10-CM | POA: Diagnosis not present

## 2018-06-20 DIAGNOSIS — R739 Hyperglycemia, unspecified: Secondary | ICD-10-CM | POA: Insufficient documentation

## 2018-06-20 DIAGNOSIS — G620 Drug-induced polyneuropathy: Secondary | ICD-10-CM | POA: Insufficient documentation

## 2018-06-20 DIAGNOSIS — Z79899 Other long term (current) drug therapy: Secondary | ICD-10-CM | POA: Diagnosis not present

## 2018-06-20 DIAGNOSIS — E748 Other specified disorders of carbohydrate metabolism: Secondary | ICD-10-CM | POA: Insufficient documentation

## 2018-06-20 LAB — CBC WITH DIFFERENTIAL/PLATELET
Abs Immature Granulocytes: 0.01 10*3/uL (ref 0.00–0.07)
Basophils Absolute: 0 10*3/uL (ref 0.0–0.1)
Basophils Relative: 0 %
Eosinophils Absolute: 0.3 10*3/uL (ref 0.0–0.5)
Eosinophils Relative: 4 %
HCT: 38.9 % (ref 36.0–46.0)
Hemoglobin: 12.7 g/dL (ref 12.0–15.0)
Immature Granulocytes: 0 %
Lymphocytes Relative: 33 %
Lymphs Abs: 2.5 10*3/uL (ref 0.7–4.0)
MCH: 28.5 pg (ref 26.0–34.0)
MCHC: 32.6 g/dL (ref 30.0–36.0)
MCV: 87.2 fL (ref 80.0–100.0)
Monocytes Absolute: 0.6 10*3/uL (ref 0.1–1.0)
Monocytes Relative: 8 %
Neutro Abs: 4.2 10*3/uL (ref 1.7–7.7)
Neutrophils Relative %: 55 %
Platelets: 272 10*3/uL (ref 150–400)
RBC: 4.46 MIL/uL (ref 3.87–5.11)
RDW: 12.5 % (ref 11.5–15.5)
WBC: 7.7 10*3/uL (ref 4.0–10.5)
nRBC: 0 % (ref 0.0–0.2)

## 2018-06-20 LAB — COMPREHENSIVE METABOLIC PANEL
ALT: 18 U/L (ref 0–44)
AST: 19 U/L (ref 15–41)
Albumin: 4 g/dL (ref 3.5–5.0)
Alkaline Phosphatase: 90 U/L (ref 38–126)
Anion gap: 10 (ref 5–15)
BUN: 13 mg/dL (ref 6–20)
CO2: 25 mmol/L (ref 22–32)
Calcium: 9.5 mg/dL (ref 8.9–10.3)
Chloride: 106 mmol/L (ref 98–111)
Creatinine, Ser: 0.65 mg/dL (ref 0.44–1.00)
GFR calc Af Amer: >60 mL/min (ref >60)
GFR calc non Af Amer: >60 mL/min (ref >60)
Glucose, Bld: 86 mg/dL (ref 70–99)
Potassium: 4 mmol/L (ref 3.5–5.1)
Sodium: 141 mmol/L (ref 135–145)
Total Bilirubin: 0.3 mg/dL (ref 0.3–1.2)
Total Protein: 7.3 g/dL (ref 6.5–8.1)

## 2018-06-20 MED ORDER — HEPARIN SOD (PORK) LOCK FLUSH 100 UNIT/ML IV SOLN
500.0000 [IU] | Freq: Once | INTRAVENOUS | Status: AC
  Administered 2018-06-20: 500 [IU]
  Filled 2018-06-20: qty 5

## 2018-06-20 MED ORDER — SODIUM CHLORIDE 0.9% FLUSH
10.0000 mL | Freq: Once | INTRAVENOUS | Status: AC
  Administered 2018-06-20: 10 mL
  Filled 2018-06-20: qty 10

## 2018-06-21 ENCOUNTER — Telehealth: Payer: Self-pay

## 2018-06-21 LAB — CA 125: Cancer Antigen (CA) 125: 8.2 U/mL (ref 0.0–38.1)

## 2018-06-21 NOTE — Telephone Encounter (Signed)
Kristy Franklin will inform Kristy Franklin that her CA-125 was WNL at 8.2. Keep appointment as scheduled with Dr. Denman George on 08-09-18

## 2018-07-16 ENCOUNTER — Ambulatory Visit (INDEPENDENT_AMBULATORY_CARE_PROVIDER_SITE_OTHER): Payer: PRIVATE HEALTH INSURANCE | Admitting: Internal Medicine

## 2018-07-16 ENCOUNTER — Other Ambulatory Visit: Payer: Self-pay

## 2018-07-16 ENCOUNTER — Encounter (INDEPENDENT_AMBULATORY_CARE_PROVIDER_SITE_OTHER): Payer: Self-pay | Admitting: Internal Medicine

## 2018-07-16 VITALS — BP 122/80 | HR 67 | Temp 98.4°F | Ht 63.0 in | Wt 168.5 lb

## 2018-07-16 DIAGNOSIS — K6289 Other specified diseases of anus and rectum: Secondary | ICD-10-CM | POA: Diagnosis not present

## 2018-07-16 MED ORDER — HYDROCORTISONE (PERIANAL) 2.5 % EX CREA: TOPICAL_CREAM | Freq: Two times a day (BID) | CUTANEOUS | 1 refills | Status: DC

## 2018-07-16 NOTE — Patient Instructions (Signed)
Rx for Anusol cream to her pharmacy. OV in  4 weeks.

## 2018-07-16 NOTE — Progress Notes (Signed)
Subjective:    Patient ID: Kristy Franklin, female    DOB: 08-13-1970, 47 y.o.   MRN: [id number] Patient did not bring her medications.  HPI Referred by Dr. Anastasio Champion for rectal pain.She tells me she has been having this pain since her surgery. She feels burning inside her rectum. When she moves her bowels, she has a burning sharp pain. Even if she takes the stool softener, she has the pain. There has been no change in her stools. No family hx of colon cancer. If she sits more than 2 hrs she says the rectal pain becomes worse. This is not the same pain as she her back pain.  She has never undergone a colonoscopy.    Hx of ovarian cancer diagnosed in 2019. She has finished chemo.  10/02/2017 Underwent a robotic assisted laparoscopic total hysterectomy with bilateral salping- oophorectomy, omentectomy, bilateral pelvic lymphadenectomy.   05/06/2018 MR Lumbar spine WO CM; low back pain radiating to both legs.  Mild L4-L5 disc bulge without spinal canal or neural foraminal.    Review of Systems Past Medical History:  Diagnosis Date  . Abdominal pain    Right mid only with bowel movements  . Blood in urine   . Change in bowel movement    more green color  . Family history of prostate cancer   . GERD (gastroesophageal reflux disease)   . Headache    after menstrual cycle every month  . History of gallstones   . Hypertension   . Neck pain on right side    pulsating  . Numbness and tingling of right leg   . Obesity   . Ovarian cancer, left ([place])   . PPD positive   . Skin rash    Chest, forehead, left arm    Past Surgical History:  Procedure Laterality Date  . CESAREAN SECTION     x2  . CHOLECYSTECTOMY     20 years ago  . IR IMAGING GUIDED PORT INSERTION  10/24/2017  . OMENTECTOMY N/A 10/02/2017   Procedure: OMENTECTOMY;  Surgeon: Everitt Amber, MD;  Location: [facility];  Service: Gynecology;  Laterality: N/A;  . OTHER SURGICAL HISTORY  07/2017   Left ovary removed  . ROBOTIC  ASSISTED TOTAL HYSTERECTOMY WITH BILATERAL SALPINGO OOPHERECTOMY Right 10/02/2017   Procedure: XI ROBOTIC ASSISTED TOTAL HYSTERECTOMY WITH RIGHT SALPINGO OOPHORECTOMY;  Surgeon: Everitt Amber, MD;  Location: [facility];  Service: Gynecology;  Laterality: Right;  . ROBOTIC PELVIC AND PARA-AORTIC LYMPH NODE DISSECTION Bilateral 10/02/2017   Procedure: XI ROBOTIC PELVIC LYMPH NODE DISSECTION;  Surgeon: Everitt Amber, MD;  Location: [facility];  Service: Gynecology;  Laterality: Bilateral;    Allergies  Allergen Reactions  . Other Rash    Pork    Current Outpatient Medications on File Prior to Visit  Medication Sig Dispense Refill  . acetaminophen (TYLENOL) 500 MG tablet Take 2 tablets (1,000 mg total) by mouth every 6 (six) hours. 30 tablet 0  . Cholecalciferol (VITAMIN D) 10 MCG/ML LIQD Take 2,000 Units by mouth.    Marland Kitchen ibuprofen (ADVIL,MOTRIN) 200 MG tablet Take 200 mg by mouth daily as needed for headache or moderate pain.    Marland Kitchen lidocaine-prilocaine (EMLA) cream Apply to affected area once 30 g 3  . lisinopril (PRINIVIL,ZESTRIL) 10 MG tablet Take 10 mg by mouth daily.   1  . mometasone (ELOCON) 0.1 % cream Apply 1 application topically 2 (two) times daily as needed for rash.  12  . senna-docusate (SENOKOT-S) 8.6-50 MG tablet  Take 2 tablets by mouth at bedtime. 30 tablet 0  . UNABLE TO FIND Progesterone and estrogen 1mg  daily.     No current facility-administered medications on file prior to visit.         Objective:   Physical Exam Blood pressure 122/80, pulse 67, temperature 98.4 F (36.9 C), height 5\' 3"  (1.6 m), weight 168 lb 8 oz (76.4 kg),  Alert and oriented. Skin warm and dry. Oral mucosa is moist.   . Sclera anicteric, conjunctivae is pink. Thyroid not enlarged. No cervical lymphadenopathy. Lungs clear. Heart regular rate and rhythm.  Abdomen is soft. Bowel sounds are positive. No hepatomegaly. No abdominal masses felt. No tenderness.  No edema to lower extremities. Rectal exam:No masses, guaiac  negative.  Portacath rt upper chest.         Assessment & Plan:  Rectal pain. ? Hemorrhoidal.   Am going to start her on Anusol cream.  Will also set her up for a colonoscopy. Will bring her back in 4 weeks to see how she is doing.  OV in 4 weeks.

## 2018-08-07 ENCOUNTER — Telehealth: Payer: Self-pay | Admitting: *Deleted

## 2018-08-07 NOTE — Telephone Encounter (Signed)
Returned the patient's call and left a message with the flush appt.

## 2018-08-09 ENCOUNTER — Other Ambulatory Visit: Payer: PRIVATE HEALTH INSURANCE

## 2018-08-09 ENCOUNTER — Other Ambulatory Visit: Payer: Self-pay

## 2018-08-09 ENCOUNTER — Encounter: Payer: Self-pay | Admitting: Gynecologic Oncology

## 2018-08-09 ENCOUNTER — Inpatient Hospital Stay: Payer: PRIVATE HEALTH INSURANCE

## 2018-08-09 ENCOUNTER — Inpatient Hospital Stay: Payer: PRIVATE HEALTH INSURANCE | Attending: Gynecologic Oncology | Admitting: Gynecologic Oncology

## 2018-08-09 VITALS — BP 109/48 | HR 57 | Temp 98.0°F | Resp 17 | Ht 63.0 in | Wt 168.3 lb

## 2018-08-09 DIAGNOSIS — Z9221 Personal history of antineoplastic chemotherapy: Secondary | ICD-10-CM

## 2018-08-09 DIAGNOSIS — Z90710 Acquired absence of both cervix and uterus: Secondary | ICD-10-CM | POA: Diagnosis not present

## 2018-08-09 DIAGNOSIS — C541 Malignant neoplasm of endometrium: Secondary | ICD-10-CM | POA: Diagnosis present

## 2018-08-09 DIAGNOSIS — Z90722 Acquired absence of ovaries, bilateral: Secondary | ICD-10-CM

## 2018-08-09 DIAGNOSIS — C562 Malignant neoplasm of left ovary: Secondary | ICD-10-CM

## 2018-08-09 DIAGNOSIS — R21 Rash and other nonspecific skin eruption: Secondary | ICD-10-CM | POA: Insufficient documentation

## 2018-08-09 MED ORDER — SODIUM CHLORIDE 0.9% FLUSH
10.0000 mL | Freq: Once | INTRAVENOUS | Status: AC
  Administered 2018-08-09: 10 mL
  Filled 2018-08-09: qty 10

## 2018-08-09 MED ORDER — HEPARIN SOD (PORK) LOCK FLUSH 100 UNIT/ML IV SOLN
250.0000 [IU] | Freq: Once | INTRAVENOUS | Status: AC
  Administered 2018-08-09: 250 [IU]
  Filled 2018-08-09: qty 5

## 2018-08-09 NOTE — Progress Notes (Signed)
Follow-up Note: Gyn-Onc  Consult was intially requested by Dr. Barrie Dunker for the evaluation of Kristy Franklin 48 y.o. female  CC:  Chief Complaint  Patient presents with  . Ovarian cancer on left [facility])   Seen with translator (spanish)  Assessment/Plan:  Kristy. Stepheni Cameron  is a 65 y.o.  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 BRCA negative.  S/p completion of chemotherapy with 6 cycles adjuvant carboplatin and paclitaxel in December, 2019.  No apparent recurrence - will order CT scan to re-evaluate the left pelvic lymphocyst that was drained.  Hydrocortisone for skin rash.   Will see her back at 3 monthly intervals with CA 125.   HPI: Kristy Franklin is a 48 year old P2 who is seen in consultation at the request of Dr Barrie Dunker for high grade serous ovarian cancer, clinical stage IC.  The patient reports a history of having seen the emergency room in [place] in May or June 2019 when she developed left lower quadrant pain.  During the ER visit an ultrasound performed which identified an adnexal mass on the left, with no signs of torsion.  She was then seen in the offices of Dr. Barrie Dunker in June 2019 and a plan was made to proceed with surgical removal of the adnexa.  On August 23, 2017 she was taken to the operating room for a laparoscopic left salpingo-oophorectomy.  Review of the operative note suggest that intraoperative findings were significant for a 6 cm left adnexal mass that was stuck in the cul-de-sac with some paraovarian adhesions noted.  It did not appear to be malignant in appearance to the surgeons at the time of surgery.  The upper abdomen, diaphragms, and omentum were commented on as looking normal.  There was no ascites.  The left tube and ovary was removed, however there was some fragmentation of the specimen during removal, and therefore the left tube and ovary were sent as  2 specimens.  The first specimen is labeled ovary biopsy for frozen section which revealed serous carcinoma and the second specimen is labeled left ovary on permanent this revealed serous carcinoma.  The operative note reports that the intraoperative diagnosis was borderline tumor, or reviewing the pathology report it states that the intraoperative diagnosis was left ovarian biopsy epithelial neoplasm at least borderline, cannot exclude carcinoma.  The frozen section of the second specimen was the same (epithelial neoplasm at least borderline cannot exclude carcinoma.).  The patient was seen back in the office by Dr. Barrie Dunker on September 06, 2017 and delivered her diagnosis of carcinoma.  At that time she was referred for consultation with gynecologic oncology.  Her past medical history 6 significant for obesity and hypertension.  She has had 2 prior cesarean deliveries.  She does not desire future fertility as she is completed childbearing.  She has no history of abnormal Pap smears and her last Pap was 7 to 8 months ago was normal.  Her past surgical history is significant for 2 cesarean sections and a laparoscopic cholecystectomy.  Her only family history is significant for father with prostate cancer at age 85.  On 10/02/17 she underwent robotic assisted total hysterectomy, RSO, omentectomy, bilateral pelvic lymphadenectomy (PA node dissection not feasible due to body habitus, and no suspicious nodes on preop imaging). Final pathology revealed residual carcinoma on the surface of the uterus (serosa) and separate additional endometrioid carcinoma in endometrium (stage I, thought to  be second primary). Metastatic carcinoma (consistent with serous) in one of 5 left pelvic lymph nodes (right side all benign). Omentum was benign (though atypical cells were identified in peritoneal washings).  CT chest/abd/pelvis on 09/24/17 (prior to staging surgery) showed no evidence of gross metastatic disease.  She was  determined to most likely have synchronous endometrial (stage I) and ovarian (stage IIIC) cancers of endometrioid and high grade serous histologies respectively after pathology and multidisciplinary conference review. Recommendation was for 6 cycles of adjuvant carboplatin and paclitaxel chemotherapy with genetics consultation.  She commenced chemotherapy with Dr Alvy Bimler on 10/26/17.  BRCA and HRD testing negative.   She completed 6 cycles of adjuvant carboplatin paclitaxel chemotherapy on February 12, 2018.  Posttreatment Ca1 25 was normal at 9.3.  Interval Hx:  She began developing mid right abdominal wall discomfort in November December 2019.  A CT abd/pelvis on March 13 2018 which showed a 12.3 cm left pelvic sidewall cystic structure favoring lymphocele.  This was drained by CT guided drainage on therapy for 2020.  The patient reported her symptoms improved after this time.  Ca1 25 on June 20, 2018 was normal at 8.2.  She developed a maculopapular pruritic rash on her left arm and left side of her neck which she is not treated with any medication.  She has what sounds like a hemorrhoid that is she is being treated by a gastroenterologist for.  Current Meds:  Outpatient Encounter Medications as of 08/09/2018  Medication Sig  . acetaminophen (TYLENOL) 500 MG tablet Take 2 tablets (1,000 mg total) by mouth every 6 (six) hours.  . Cholecalciferol (VITAMIN D) 10 MCG/ML LIQD Take 2,000 Units by mouth.  . estradiol (ESTRACE) 1 MG tablet Take 1 mg by mouth daily.  . hydrocortisone (ANUSOL-HC) 2.5 % rectal cream Place rectally 2 (two) times daily.  Marland Kitchen ibuprofen (ADVIL,MOTRIN) 200 MG tablet Take 200 mg by mouth daily as needed for headache or moderate pain.  Marland Kitchen lidocaine-prilocaine (EMLA) cream Apply to affected area once  . lisinopril (PRINIVIL,ZESTRIL) 10 MG tablet Take 10 mg by mouth daily.   . mometasone (ELOCON) 0.1 % cream Apply 1 application topically 2 (two) times daily as needed for  rash.  . progesterone (PROMETRIUM) 200 MG capsule TAKE 1 CAPSULE BY MOUTH ONCE DAILY AT NIGHT  . senna-docusate (SENOKOT-S) 8.6-50 MG tablet Take 2 tablets by mouth at bedtime.  . [DISCONTINUED] UNABLE TO FIND Progesterone and estrogen '1mg'$  daily.   No facility-administered encounter medications on file as of 08/09/2018.     Allergy:  Allergies  Allergen Reactions  . Other Rash    Pork    Social Hx:   Social History   Socioeconomic History  . Marital status: Married    Spouse name: Media planner  . Number of children: 2  . Years of education: Not on file  . Highest education level: Not on file  Occupational History  . Occupation: unemployed  Social Needs  . Financial resource strain: Not on file  . Food insecurity    Worry: Not on file    Inability: Not on file  . Transportation needs    Medical: Not on file    Non-medical: Not on file  Tobacco Use  . Smoking status: Never Smoker  . Smokeless tobacco: Never Used  Substance and Sexual Activity  . Alcohol use: Never    Frequency: Never  . Drug use: Never  . Sexual activity: Not Currently    Comment: calendar  Lifestyle  . Physical  activity    Days per week: Not on file    Minutes per session: Not on file  . Stress: Not on file  Relationships  . Social Herbalist on phone: Not on file    Gets together: Not on file    Attends religious service: Not on file    Active member of club or organization: Not on file    Attends meetings of clubs or organizations: Not on file    Relationship status: Not on file  . Intimate partner violence    Fear of current or ex partner: Not on file    Emotionally abused: Not on file    Physically abused: Not on file    Forced sexual activity: Not on file  Other Topics Concern  . Not on file  Social History Narrative  . Not on file    Past Surgical Hx:  Past Surgical History:  Procedure Laterality Date  . CESAREAN SECTION     x2  . CHOLECYSTECTOMY     20 years ago  .  IR IMAGING GUIDED PORT INSERTION  10/24/2017  . OMENTECTOMY N/A 10/02/2017   Procedure: OMENTECTOMY;  Surgeon: Everitt Amber, MD;  Location: [facility];  Service: Gynecology;  Laterality: N/A;  . OTHER SURGICAL HISTORY  07/2017   Left ovary removed  . ROBOTIC ASSISTED TOTAL HYSTERECTOMY WITH BILATERAL SALPINGO OOPHERECTOMY Right 10/02/2017   Procedure: XI ROBOTIC ASSISTED TOTAL HYSTERECTOMY WITH RIGHT SALPINGO OOPHORECTOMY;  Surgeon: Everitt Amber, MD;  [facility];  Service: Gynecology;  Laterality: Right;  . ROBOTIC PELVIC AND PARA-AORTIC LYMPH NODE DISSECTION Bilateral 10/02/2017   Procedure: XI ROBOTIC PELVIC LYMPH NODE DISSECTION;  Surgeon: Everitt Amber, MD;  Location: [facility];  Service: Gynecology;  Laterality: Bilateral;    Past Medical Hx:  Past Medical History:  Diagnosis Date  . Abdominal pain    Right mid only with bowel movements  . Blood in urine   . Change in bowel movement    more green color  . Family history of prostate cancer   . GERD (gastroesophageal reflux disease)   . Headache    after menstrual cycle every month  . History of gallstones   . Hypertension   . Neck pain on right side    pulsating  . Numbness and tingling of right leg   . Obesity   . Ovarian cancer, left ([place])   . PPD positive   . Skin rash    Chest, forehead, left arm    Past Gynecological History:  C/s x 2. No history of abnormal paps.  Patient's last menstrual period was 09/24/2017.  Family Hx:  Family History  Problem Relation Age of Onset  . Hypertension Mother   . Prostate cancer Father 65       d. 48  . Diabetes Paternal Aunt   . Heart attack Paternal Aunt   . Heart attack Paternal Grandmother 95  . Thyroid disease Other 14    Review of Systems:  Constitutional  Feels well,    ENT Normal appearing ears and nares bilaterally Skin/Breast  No rash, sores, jaundice, itching, dryness Cardiovascular  No chest pain, shortness of breath, or edema  Pulmonary  No cough or wheeze.   Gastro Intestinal  No nausea, vomitting, or diarrhoea. No bright red blood per rectum, change in bowel movement, or constipation. + abdominal pain for 2 months. Intermittent Genito Urinary  No frequency, urgency, dysuria, no pain, no bleeding. Musculo Skeletal  No myalgia, arthralgia,  joint swelling or pain  Neurologic  No weakness, numbness, change in gait,  Psychology  No depression, anxiety, insomnia.   Vitals:  Blood pressure (!) 109/48, pulse (!) 57, temperature 98 F (36.7 C), temperature source Oral, resp. rate 17, height '5\' 3"'$  (1.6 m), weight 168 lb 4.8 oz (76.3 kg), last menstrual period 09/24/2017, SpO2 99 %.  Physical Exam: WD in NAD Neck  Supple NROM, without any enlargements.  Lymph Node Survey No cervical supraclavicular or inguinal adenopathy Cardiovascular  Pulse normal rate, regularity and rhythm. S1 and S2 normal.  Lungs  Clear to auscultation bilateraly, without wheezes/crackles/rhonchi. Good air movement.  Skin  No rash/lesions/breakdown  Psychiatry  Alert and oriented to person, place, and time  Abdomen  Normoactive bowel sounds, abdomen soft, non-tender and obese without evidence of hernia. Incisions well healed, there is some subtle nodularity appreciated in the right mid abdominal wall, corresponds to patient's discomfort, does not correspond to incision sites. Back No CVA tenderness Genito Urinary  Vaginal cuff in tact, well healed, no blood, no masses  Rectal  deferred Extremities  No bilateral cyanosis, clubbing or edema.    Thereasa Solo, MD  08/09/2018, 10:25 AM

## 2018-08-09 NOTE — Patient Instructions (Signed)
Dr Denman George is recommending a CT scan to evaluate the pelvic cyst that you had before that was drained.  Please return to see her in 3 months. A lab will be drawn before this.  Please try hydrocortisone cream (this could be purchased from the supermarket or pharmacy) and applied to the rash twice a day. If this does not improve it, you should notify your primary care doctor.  Please notify Dr Denman George at phone number [phone] if you notice vaginal bleeding, new pelvic or abdominal pains, bloating, feeling full easy, or a change in bladder or bowel function.

## 2018-08-12 ENCOUNTER — Encounter (INDEPENDENT_AMBULATORY_CARE_PROVIDER_SITE_OTHER): Payer: Self-pay

## 2018-08-13 ENCOUNTER — Encounter (INDEPENDENT_AMBULATORY_CARE_PROVIDER_SITE_OTHER): Payer: Self-pay | Admitting: Internal Medicine

## 2018-08-13 ENCOUNTER — Ambulatory Visit (INDEPENDENT_AMBULATORY_CARE_PROVIDER_SITE_OTHER): Payer: PRIVATE HEALTH INSURANCE | Admitting: Internal Medicine

## 2018-08-13 ENCOUNTER — Other Ambulatory Visit: Payer: Self-pay

## 2018-08-13 VITALS — BP 137/84 | HR 72 | Temp 98.4°F | Ht 63.0 in | Wt 169.1 lb

## 2018-08-13 DIAGNOSIS — R109 Unspecified abdominal pain: Secondary | ICD-10-CM | POA: Insufficient documentation

## 2018-08-13 DIAGNOSIS — K6289 Other specified diseases of anus and rectum: Secondary | ICD-10-CM | POA: Diagnosis not present

## 2018-08-13 NOTE — Progress Notes (Signed)
Subjective:    Patient ID: Kristy Franklin, female    DOB: 1970/04/15, 48 y.o.   MRN: [id number]  HPI Here today for f/u. Last seen in May of this year for rectal pain. ? Hemorrhoidal. She was started on Anusol cream. Colonoscopy will be scheduled.  No family hx of colon cancer. Rectal pain worse have she has sat greater than 2 hrs.  She tells me the Anusol cream helped She is not having any rectal pain.  Her BMs are moving okay. She has to take [facility] because she get a little constipated. Her appetite is okay. No weight  Hx of ovarian cancer diagnosed in 2019. She has finished chemo.  10/02/2017 Underwent a robotic assisted laparoscopic total hysterectomy with bilateral salping- oophorectomy, omentectomy, bilateral pelvic lymphadenectomy.   05/06/2018 MR Lumbar spine WO CM; low back pain radiating to both legs.  Mild L4-L5 disc bulge without spinal canal or neural foraminal.  Review of Systems Past Medical History:  Diagnosis Date  . Abdominal pain    Right mid only with bowel movements  . Blood in urine   . Change in bowel movement    more green color  . Family history of prostate cancer   . GERD (gastroesophageal reflux disease)   . Headache    after menstrual cycle every month  . History of gallstones   . Hypertension   . Neck pain on right side    pulsating  . Numbness and tingling of right leg   . Obesity   . Ovarian cancer, left ([place])   . PPD positive   . Skin rash    Chest, forehead, left arm    Past Surgical History:  Procedure Laterality Date  . CESAREAN SECTION     x2  . CHOLECYSTECTOMY     20 years ago  . IR IMAGING GUIDED PORT INSERTION  10/24/2017  . OMENTECTOMY N/A 10/02/2017   Procedure: OMENTECTOMY;  Surgeon: Everitt Amber, MD;  [facility];  Service: Gynecology;  Laterality: N/A;  . OTHER SURGICAL HISTORY  07/2017   Left ovary removed  . ROBOTIC ASSISTED TOTAL HYSTERECTOMY WITH BILATERAL SALPINGO OOPHERECTOMY Right 10/02/2017   Procedure: XI  ROBOTIC ASSISTED TOTAL HYSTERECTOMY WITH RIGHT SALPINGO OOPHORECTOMY;  Surgeon: Everitt Amber, MD;  [facility];  Service: Gynecology;  Laterality: Right;  . ROBOTIC PELVIC AND PARA-AORTIC LYMPH NODE DISSECTION Bilateral 10/02/2017   Procedure: XI ROBOTIC PELVIC LYMPH NODE DISSECTION;  Surgeon: Everitt Amber, MD;  [facility];  Service: Gynecology;  Laterality: Bilateral;    Allergies  Allergen Reactions  . Other Rash    Pork    Current Outpatient Medications on File Prior to Visit  Medication Sig Dispense Refill  . acetaminophen (TYLENOL) 500 MG tablet Take 2 tablets (1,000 mg total) by mouth every 6 (six) hours. 30 tablet 0  . Cholecalciferol (VITAMIN D) 10 MCG/ML LIQD Take 2,000 Units by mouth.    . estradiol (ESTRACE) 1 MG tablet Take 1 mg by mouth daily.    . hydrocortisone (ANUSOL-HC) 2.5 % rectal cream Place rectally 2 (two) times daily. 30 g 1  . ibuprofen (ADVIL,MOTRIN) 200 MG tablet Take 200 mg by mouth daily as needed for headache or moderate pain.    Marland Kitchen lidocaine-prilocaine (EMLA) cream Apply to affected area once 30 g 3  . lisinopril (PRINIVIL,ZESTRIL) 10 MG tablet Take 10 mg by mouth daily.   1  . mometasone (ELOCON) 0.1 % cream Apply 1 application topically 2 (two) times daily as  needed for rash.  12  . progesterone (PROMETRIUM) 200 MG capsule TAKE 1 CAPSULE BY MOUTH ONCE DAILY AT NIGHT    . senna-docusate (SENOKOT-S) 8.6-50 MG tablet Take 2 tablets by mouth at bedtime. 30 tablet 0   No current facility-administered medications on file prior to visit.         Objective:   Physical Exam Blood pressure 137/84, pulse 72, temperature 98.4 F (36.9 C), height 5\' 3"  (1.6 m), weight 169 lb 1.6 oz (76.7 kg), last menstrual period 09/24/2017.  Alert and oriented. Skin warm and dry. Oral mucosa is moist.   . Sclera anicteric, conjunctivae is pink. Thyroid not enlarged. No cervical lymphadenopathy. Lungs clear. Heart regular rate and rhythm.  Abdomen is soft. Bowel sounds are  positive. No hepatomegaly. No abdominal masses felt. No tenderness.  No edema to lower extremities.         Assessment & Plan:  Rectal pain resolved with the Anusol Cream. Will go ahead and set up for a screening colonoscopy given her hx of ovarian cancer.

## 2018-08-13 NOTE — Patient Instructions (Signed)
Will set up for a colonoscopy

## 2018-08-15 ENCOUNTER — Ambulatory Visit (HOSPITAL_COMMUNITY)
Admission: RE | Admit: 2018-08-15 | Discharge: 2018-08-15 | Disposition: A | Discharge status: Home / Self Care | Payer: PRIVATE HEALTH INSURANCE | Source: Ambulatory Visit | Attending: Gynecologic Oncology | Admitting: Gynecologic Oncology

## 2018-08-15 ENCOUNTER — Other Ambulatory Visit: Payer: Self-pay

## 2018-08-15 DIAGNOSIS — C562 Malignant neoplasm of left ovary: Secondary | ICD-10-CM | POA: Diagnosis not present

## 2018-08-15 MED ORDER — HEPARIN SOD (PORK) LOCK FLUSH 100 UNIT/ML IV SOLN
500.0000 [IU] | Freq: Once | INTRAVENOUS | Status: AC
  Administered 2018-08-15: 500 [IU] via INTRAVENOUS

## 2018-08-15 MED ORDER — SODIUM CHLORIDE (PF) 0.9 % IJ SOLN
INTRAMUSCULAR | Status: AC
  Filled 2018-08-15: qty 50

## 2018-08-15 MED ORDER — HEPARIN SOD (PORK) LOCK FLUSH 100 UNIT/ML IV SOLN
INTRAVENOUS | Status: AC
  Filled 2018-08-15: qty 5

## 2018-08-15 MED ORDER — IOHEXOL 300 MG/ML  SOLN
100.0000 mL | Freq: Once | INTRAMUSCULAR | Status: AC | PRN
  Administered 2018-08-15: 100 mL via INTRAVENOUS

## 2018-08-16 ENCOUNTER — Telehealth: Payer: Self-pay

## 2018-08-16 DIAGNOSIS — R188 Other ascites: Secondary | ICD-10-CM

## 2018-08-16 DIAGNOSIS — C562 Malignant neoplasm of left ovary: Secondary | ICD-10-CM

## 2018-08-16 NOTE — Telephone Encounter (Signed)
Told Kristy Franklin the results of the scan as noted below by Dr. Denman George. Will schedule a repeat CT scan of Abdomen and Pelvis prior to her 11-13-18 visit with Dr. Denman George. Patient verbalized understanding.

## 2018-08-16 NOTE — Telephone Encounter (Signed)
Told Kristy Franklin that the CT scan is scheduled for 11-07-18 at [place] at [place]. She needs to go to [facility] radiology department to pick up the contrast and instructions in the beginning of September. Pt verbalized understanding.

## 2018-08-16 NOTE — Telephone Encounter (Signed)
-----   Message from Baruch Merl, RN sent at 08/16/2018  9:57 AM EDT -----  ----- Message ----- From: Everitt Amber, MD Sent: 08/15/2018  11:51 AM EDT To: Dorothyann Gibbs, NP  I have a low suspicion this is recurrence. Let's tell her that there continues to be a cyst in the pelvis but no signs of cancer. Dr Denman George will repeat the scan in 3 months to monitor it. Terrence Dupont ----- Message ----- From: Buel Ream, Rad Results In Sent: 08/15/2018  10:48 AM EDT To: Everitt Amber, MD

## 2018-08-21 ENCOUNTER — Telehealth (INDEPENDENT_AMBULATORY_CARE_PROVIDER_SITE_OTHER): Payer: Self-pay | Admitting: *Deleted

## 2018-08-21 ENCOUNTER — Encounter (INDEPENDENT_AMBULATORY_CARE_PROVIDER_SITE_OTHER): Payer: Self-pay | Admitting: *Deleted

## 2018-08-21 ENCOUNTER — Other Ambulatory Visit (INDEPENDENT_AMBULATORY_CARE_PROVIDER_SITE_OTHER): Payer: Self-pay | Admitting: Internal Medicine

## 2018-08-21 DIAGNOSIS — K6289 Other specified diseases of anus and rectum: Secondary | ICD-10-CM

## 2018-08-21 NOTE — Telephone Encounter (Signed)
Patient needs trilyte 

## 2018-08-22 MED ORDER — PEG 3350-KCL-NA BICARB-NACL 420 G PO SOLR: 4000.0000 mL | Freq: Once | ORAL | 0 refills | Status: AC

## 2018-08-28 ENCOUNTER — Other Ambulatory Visit (HOSPITAL_COMMUNITY): Payer: Self-pay | Admitting: Internal Medicine

## 2018-08-28 ENCOUNTER — Ambulatory Visit (HOSPITAL_COMMUNITY)
Admission: RE | Admit: 2018-08-28 | Discharge: 2018-08-28 | Disposition: A | Discharge status: Home / Self Care | Payer: PRIVATE HEALTH INSURANCE | Source: Ambulatory Visit | Attending: Internal Medicine | Admitting: Internal Medicine

## 2018-08-28 ENCOUNTER — Other Ambulatory Visit: Payer: Self-pay

## 2018-08-28 DIAGNOSIS — M546 Pain in thoracic spine: Secondary | ICD-10-CM

## 2018-09-10 ENCOUNTER — Other Ambulatory Visit: Payer: Self-pay

## 2018-09-10 ENCOUNTER — Other Ambulatory Visit (HOSPITAL_COMMUNITY)
Admission: RE | Admit: 2018-09-10 | Discharge: 2018-09-10 | Disposition: A | Discharge status: Home / Self Care | Payer: PRIVATE HEALTH INSURANCE | Source: Ambulatory Visit | Attending: Internal Medicine | Admitting: Internal Medicine

## 2018-09-10 DIAGNOSIS — Z1159 Encounter for screening for other viral diseases: Secondary | ICD-10-CM | POA: Insufficient documentation

## 2018-09-11 LAB — SARS CORONAVIRUS 2 (TAT 6-24 HRS): SARS Coronavirus 2: NEGATIVE

## 2018-09-12 ENCOUNTER — Ambulatory Visit (HOSPITAL_COMMUNITY)
Admission: RE | Admit: 2018-09-12 | Discharge: 2018-09-12 | Disposition: A | Discharge status: Home / Self Care | Payer: PRIVATE HEALTH INSURANCE | Attending: Internal Medicine | Admitting: Internal Medicine

## 2018-09-12 ENCOUNTER — Other Ambulatory Visit: Payer: Self-pay

## 2018-09-12 ENCOUNTER — Encounter (HOSPITAL_COMMUNITY): Payer: Self-pay

## 2018-09-12 ENCOUNTER — Encounter (HOSPITAL_COMMUNITY)
Admission: RE | Disposition: A | Discharge status: Home / Self Care | Payer: Self-pay | Source: Home / Self Care | Attending: Internal Medicine

## 2018-09-12 DIAGNOSIS — R109 Unspecified abdominal pain: Secondary | ICD-10-CM | POA: Insufficient documentation

## 2018-09-12 DIAGNOSIS — K648 Other hemorrhoids: Secondary | ICD-10-CM | POA: Insufficient documentation

## 2018-09-12 DIAGNOSIS — Z8543 Personal history of malignant neoplasm of ovary: Secondary | ICD-10-CM | POA: Diagnosis not present

## 2018-09-12 DIAGNOSIS — Z9221 Personal history of antineoplastic chemotherapy: Secondary | ICD-10-CM | POA: Diagnosis not present

## 2018-09-12 DIAGNOSIS — K644 Residual hemorrhoidal skin tags: Secondary | ICD-10-CM | POA: Diagnosis not present

## 2018-09-12 DIAGNOSIS — K5730 Diverticulosis of large intestine without perforation or abscess without bleeding: Secondary | ICD-10-CM | POA: Insufficient documentation

## 2018-09-12 DIAGNOSIS — I10 Essential (primary) hypertension: Secondary | ICD-10-CM | POA: Insufficient documentation

## 2018-09-12 DIAGNOSIS — K6289 Other specified diseases of anus and rectum: Secondary | ICD-10-CM | POA: Diagnosis present

## 2018-09-12 DIAGNOSIS — Z79890 Hormone replacement therapy (postmenopausal): Secondary | ICD-10-CM | POA: Diagnosis not present

## 2018-09-12 DIAGNOSIS — Z9049 Acquired absence of other specified parts of digestive tract: Secondary | ICD-10-CM | POA: Diagnosis not present

## 2018-09-12 HISTORY — PX: COLONOSCOPY: SHX5424

## 2018-09-12 SURGERY — COLONOSCOPY
Anesthesia: Moderate Sedation

## 2018-09-12 MED ORDER — AMITRIPTYLINE HCL 25 MG PO TABS: 25.0000 mg | ORAL_TABLET | Freq: Every day | ORAL | 1 refills | Status: DC

## 2018-09-12 MED ORDER — STERILE WATER FOR IRRIGATION IR SOLN
Status: DC | PRN
  Administered 2018-09-12: 2.5 mL

## 2018-09-12 MED ORDER — MIDAZOLAM HCL 5 MG/5ML IJ SOLN
INTRAMUSCULAR | Status: DC | PRN
  Administered 2018-09-12 (×2): 2 mg via INTRAVENOUS
  Administered 2018-09-12: 1 mg via INTRAVENOUS
  Administered 2018-09-12: 2 mg via INTRAVENOUS
  Administered 2018-09-12: 1 mg via INTRAVENOUS

## 2018-09-12 MED ORDER — SODIUM CHLORIDE 0.9 % IV SOLN
INTRAVENOUS | Status: DC
  Administered 2018-09-12: 14:00:00 via INTRAVENOUS

## 2018-09-12 MED ORDER — MEPERIDINE HCL 50 MG/ML IJ SOLN
INTRAMUSCULAR | Status: DC | PRN
  Administered 2018-09-12 (×2): 25 mg via INTRAVENOUS

## 2018-09-12 MED ORDER — MIDAZOLAM HCL 5 MG/5ML IJ SOLN
INTRAMUSCULAR | Status: AC
  Filled 2018-09-12: qty 10

## 2018-09-12 MED ORDER — MEPERIDINE HCL 50 MG/ML IJ SOLN
INTRAMUSCULAR | Status: AC
  Filled 2018-09-12: qty 1

## 2018-09-12 NOTE — Discharge Instructions (Signed)
Resume usual medications as before. Amitriptyline 25 mg.  Half a tablet by mouth daily at bedtime for 1 week and if you have no side effects increase the dose to 1 tablet or 25 mg daily at bedtime. High-fiber diet. No driving for 24 hours. Next screening colonoscopy in 10 years. Office visit in 1 month.   Colonoscopy, Adult, Care After This sheet gives you information about how to care for yourself after your procedure. Your doctor may also give you more specific instructions. If you have problems or questions, call your doctor. What can I expect after the procedure? After the procedure, it is common to have:  A small amount of blood in your poop for 24 hours.  Some gas.  Mild cramping or bloating in your belly. Follow these instructions at home: General instructions  For the first 24 hours after the procedure: ? Do not drive or use machinery. ? Do not sign important documents. ? Do not drink alcohol. ? Do your daily activities more slowly than normal. ? Eat foods that are soft and easy to digest.  Take over-the-counter or prescription medicines only as told by your doctor. To help cramping and bloating:   Try walking around.  Put heat on your belly (abdomen) as told by your doctor. Use a heat source that your doctor recommends, such as a moist heat pack or a heating pad. ? Put a towel between your skin and the heat source. ? Leave the heat on for 20-30 minutes. ? Remove the heat if your skin turns bright red. This is especially important if you cannot feel pain, heat, or cold. You can get burned. Eating and drinking   Drink enough fluid to keep your pee (urine) clear or pale yellow.  Return to your normal diet as told by your doctor. Avoid heavy or fried foods that are hard to digest.  Avoid drinking alcohol for as long as told by your doctor. Contact a doctor if:  You have blood in your poop (stool) 2-3 days after the procedure. Get help right away if:  You have  more than a small amount of blood in your poop.  You see large clumps of tissue (blood clots) in your poop.  Your belly is swollen.  You feel sick to your stomach (nauseous).  You throw up (vomit).  You have a fever.  You have belly pain that gets worse, and medicine does not help your pain. Summary  After the procedure, it is common to have a small amount of blood in your poop. You may also have mild cramping and bloating in your belly.  For the first 24 hours after the procedure, do not drive or use machinery, do not sign important documents, and do not drink alcohol.  Get help right away if you have a lot of blood in your poop, feel sick to your stomach, have a fever, or have more belly pain. This information is not intended to replace advice given to you by your health care provider. Make sure you discuss any questions you have with your health care provider. Document Released: 03/18/2010 Document Revised: 12/14/2016 Document Reviewed: 11/08/2015 Elsevier Patient Education  2020 [employer].  Diverticulosis  Diverticulosis is a condition that develops when small pouches (diverticula) form in the wall of the large intestine (colon). The colon is where water is absorbed and stool is formed. The pouches form when the inside layer of the colon pushes through weak spots in the outer layers of the colon.  You may have a few pouches or many of them. What are the causes? The cause of this condition is not known. What increases the risk? The following factors may make you more likely to develop this condition:  Being older than age 99. Your risk for this condition increases with age. Diverticulosis is rare among people younger than age 73. By age 49, many people have it.  Eating a low-fiber diet.  Having frequent constipation.  Being overweight.  Not getting enough exercise.  Smoking.  Taking over-the-counter pain medicines, like aspirin and ibuprofen.  Having a family  history of diverticulosis. What are the signs or symptoms? In most people, there are no symptoms of this condition. If you do have symptoms, they may include:  Bloating.  Cramps in the abdomen.  Constipation or diarrhea.  Pain in the lower left side of the abdomen. How is this diagnosed? This condition is most often diagnosed during an exam for other colon problems. Because diverticulosis usually has no symptoms, it often cannot be diagnosed independently. This condition may be diagnosed by:  Using a flexible scope to examine the colon (colonoscopy).  Taking an X-ray of the colon after dye has been put into the colon (barium enema).  Doing a CT scan. How is this treated? You may not need treatment for this condition if you have never developed an infection related to diverticulosis. If you have had an infection before, treatment may include:  Eating a high-fiber diet. This may include eating more fruits, vegetables, and grains.  Taking a fiber supplement.  Taking a live bacteria supplement (probiotic).  Taking medicine to relax your colon.  Taking antibiotic medicines. Follow these instructions at home:  Drink 6-8 glasses of water or more each day to prevent constipation.  Try not to strain when you have a bowel movement.  If you have had an infection before: ? Eat more fiber as directed by your health care provider or your diet and nutrition specialist (dietitian). ? Take a fiber supplement or probiotic, if your health care provider approves.  Take over-the-counter and prescription medicines only as told by your health care provider.  If you were prescribed an antibiotic, take it as told by your health care provider. Do not stop taking the antibiotic even if you start to feel better.  Keep all follow-up visits as told by your health care provider. This is important. Contact a health care provider if:  You have pain in your abdomen.  You have bloating.  You have  cramps.  You have not had a bowel movement in 3 days. Get help right away if:  Your pain gets worse.  Your bloating becomes very bad.  You have a fever or chills, and your symptoms suddenly get worse.  You vomit.  You have bowel movements that are bloody or black.  You have bleeding from your rectum. Summary  Diverticulosis is a condition that develops when small pouches (diverticula) form in the wall of the large intestine (colon).  You may have a few pouches or many of them.  This condition is most often diagnosed during an exam for other colon problems.  If you have had an infection related to diverticulosis, treatment may include increasing the fiber in your diet, taking supplements, or taking medicines. This information is not intended to replace advice given to you by your health care provider. Make sure you discuss any questions you have with your health care provider. Document Released: 11/11/2003 Document Revised: 01/26/2017 Document Reviewed:  01/03/2016 Elsevier Patient Education  2020 [employer].  Hemorrhoids Hemorrhoids are swollen veins in and around the rectum or anus. There are two types of hemorrhoids:  Internal hemorrhoids. These occur in the veins that are just inside the rectum. They may poke through to the outside and become irritated and painful.  External hemorrhoids. These occur in the veins that are outside the anus and can be felt as a painful swelling or hard lump near the anus. Most hemorrhoids do not cause serious problems, and they can be managed with home treatments such as diet and lifestyle changes. If home treatments do not help the symptoms, procedures can be done to shrink or remove the hemorrhoids. What are the causes? This condition is caused by increased pressure in the anal area. This pressure may result from various things, including:  Constipation.  Straining to have a bowel  movement.  Diarrhea.  Pregnancy.  Obesity.  Sitting for long periods of time.  Heavy lifting or other activity that causes you to strain.  Anal sex.  Riding a bike for a long period of time. What are the signs or symptoms? Symptoms of this condition include:  Pain.  Anal itching or irritation.  Rectal bleeding.  Leakage of stool (feces).  Anal swelling.  One or more lumps around the anus. How is this diagnosed? This condition can often be diagnosed through a visual exam. Other exams or tests may also be done, such as:  An exam that involves feeling the rectal area with a gloved hand (digital rectal exam).  An exam of the anal canal that is done using a small tube (anoscope).  A blood test, if you have lost a significant amount of blood.  A test to look inside the colon using a flexible tube with a camera on the end (sigmoidoscopy or colonoscopy). How is this treated? This condition can usually be treated at home. However, various procedures may be done if dietary changes, lifestyle changes, and other home treatments do not help your symptoms. These procedures can help make the hemorrhoids smaller or remove them completely. Some of these procedures involve surgery, and others do not. Common procedures include:  Rubber band ligation. Rubber bands are placed at the base of the hemorrhoids to cut off their blood supply.  Sclerotherapy. Medicine is injected into the hemorrhoids to shrink them.  Infrared coagulation. A type of light energy is used to get rid of the hemorrhoids.  Hemorrhoidectomy surgery. The hemorrhoids are surgically removed, and the veins that supply them are tied off.  Stapled hemorrhoidopexy surgery. The surgeon staples the base of the hemorrhoid to the rectal wall. Follow these instructions at home: Eating and drinking   Eat foods that have a lot of fiber in them, such as whole grains, beans, nuts, fruits, and vegetables.  Ask your health care  provider about taking products that have added fiber (fiber supplements).  Reduce the amount of fat in your diet. You can do this by eating low-fat dairy products, eating less red meat, and avoiding processed foods.  Drink enough fluid to keep your urine pale yellow. Managing pain and swelling   Take warm sitz baths for 20 minutes, 3-4 times a day to ease pain and discomfort. You may do this in a bathtub or using a portable sitz bath that fits over the toilet.  If directed, apply ice to the affected area. Using ice packs between sitz baths may be helpful. ? Put ice in a plastic bag. ? Place  a towel between your skin and the bag. ? Leave the ice on for 20 minutes, 2-3 times a day. General instructions  Take over-the-counter and prescription medicines only as told by your health care provider.  Use medicated creams or suppositories as told.  Get regular exercise. Ask your health care provider how much and what kind of exercise is best for you. In general, you should do moderate exercise for at least 30 minutes on most days of the week (150 minutes each week). This can include activities such as walking, biking, or yoga.  Go to the bathroom when you have the urge to have a bowel movement. Do not wait.  Avoid straining to have bowel movements.  Keep the anal area dry and clean. Use wet toilet paper or moist towelettes after a bowel movement.  Do not sit on the toilet for long periods of time. This increases blood pooling and pain.  Keep all follow-up visits as told by your health care provider. This is important. Contact a health care provider if you have:  Increasing pain and swelling that are not controlled by treatment or medicine.  Difficulty having a bowel movement, or you are unable to have a bowel movement.  Pain or inflammation outside the area of the hemorrhoids. Get help right away if you have:  Uncontrolled bleeding from your rectum. Summary  Hemorrhoids are swollen  veins in and around the rectum or anus.  Most hemorrhoids can be managed with home treatments such as diet and lifestyle changes.  Taking warm sitz baths can help ease pain and discomfort.  In severe cases, procedures or surgery can be done to shrink or remove the hemorrhoids. This information is not intended to replace advice given to you by your health care provider. Make sure you discuss any questions you have with your health care provider. Document Released: 02/11/2000 Document Revised: 02/21/2018 Document Reviewed: 07/05/2017 Elsevier Patient Education  2020 [employer].

## 2018-09-12 NOTE — H&P (Signed)
Kristy Franklin is an 48 y.o. female.   Chief Complaint: Patient is here for colonoscopy. 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  She also received chemotherapy.  She has had rectal pain ever since.  She has been treated with topicals but with minimal benefit.  She denies change in bowel habits or rectal bleeding.  She is undergoing colonoscopy both for diagnostic purposes. Family history is negative for CRC.  Past Medical History:  Diagnosis Date  . Abdominal pain    Right mid only with bowel movements  . Blood in urine   . Change in bowel movement    more green color  . Family history of prostate cancer   . GERD (gastroesophageal reflux disease)   . Headache    after menstrual cycle every month  . History of gallstones   . Hypertension   . Neck pain on right side    pulsating  . Numbness and tingling of right leg   . Obesity   . Ovarian cancer, left ([place])   . PPD positive   . Skin rash    Chest, forehead, left arm    Past Surgical History:  Procedure Laterality Date  . CESAREAN SECTION     x2  . CHOLECYSTECTOMY     20 years ago  . IR IMAGING GUIDED PORT INSERTION  10/24/2017  . OMENTECTOMY N/A 10/02/2017   Procedure: OMENTECTOMY;  Surgeon: Everitt Amber, MD;  Location: [facility];  Service: Gynecology;  Laterality: N/A;  . OTHER SURGICAL HISTORY  07/2017   Left ovary removed  . ROBOTIC ASSISTED TOTAL HYSTERECTOMY WITH BILATERAL SALPINGO OOPHERECTOMY Right 10/02/2017   Procedure: XI ROBOTIC ASSISTED TOTAL HYSTERECTOMY WITH RIGHT SALPINGO OOPHORECTOMY;  Surgeon: Everitt Amber, MD;  Location: [facility];  Service: Gynecology;  Laterality: Right;  . ROBOTIC PELVIC AND PARA-AORTIC LYMPH NODE DISSECTION Bilateral 10/02/2017   Procedure: XI ROBOTIC PELVIC LYMPH NODE DISSECTION;  Surgeon: Everitt Amber, MD;  Location: [facility];  Service: Gynecology;  Laterality: Bilateral;    Family History  Problem Relation Age of Onset  .  Hypertension Mother   . Prostate cancer Father 33       d. 56  . Diabetes Paternal Aunt   . Heart attack Paternal Aunt   . Heart attack Paternal Grandmother 95  . Thyroid disease Other 14   Social History:  reports that she has never smoked. She has never used smokeless tobacco. She reports that she does not drink alcohol or use drugs.  Allergies:  Allergies  Allergen Reactions  . Other Rash    Pork    Medications Prior to Admission  Medication Sig Dispense Refill  . acetaminophen (TYLENOL) 500 MG tablet Take 2 tablets (1,000 mg total) by mouth every 6 (six) hours. (Patient taking differently: Take 1,000 mg by mouth every 6 (six) hours as needed (pain/headaches.). ) 30 tablet 0  . Cholecalciferol (VITAMIN D-3) 125 MCG (5000 UT) TABS Take 5,000 Units by mouth daily.    Marland Kitchen estradiol (ESTRACE) 1 MG tablet Take 0.5 mg by mouth daily.     . hydrocortisone (ANUSOL-HC) 2.5 % rectal cream Place rectally 2 (two) times daily. (Patient taking differently: Place 1 application rectally 2 (two) times daily as needed for hemorrhoids. ) 30 g 1  . HYDROCORTISONE EX Apply 1 application topically 2 (two) times daily as needed (itching.).    Marland Kitchen ibuprofen (ADVIL,MOTRIN) 200 MG tablet Take 200-400 mg by mouth every 6 (six)  hours as needed (pain/headaches).     . lidocaine-prilocaine (EMLA) cream Apply to affected area once (Patient taking differently: Apply 1 application topically daily as needed (prior to port accessed). ) 30 g 3  . lisinopril (PRINIVIL,ZESTRIL) 10 MG tablet Take 10 mg by mouth every evening.   1  . mometasone (ELOCON) 0.1 % cream Apply 1 application topically 2 (two) times daily as needed (rash).   12  . senna-docusate (SENOKOT-S) 8.6-50 MG tablet Take 2 tablets by mouth at bedtime. (Patient taking differently: Take 2 tablets by mouth daily as needed (constipation.). ) 30 tablet 0  . progesterone (PROMETRIUM) 200 MG capsule Take 200 mg by mouth at bedtime.       No results found for this  or any previous visit (from the past 48 hour(s)). No results found.  ROS  Blood pressure 115/64, pulse 71, temperature 98 F (36.7 C), temperature source Oral, resp. rate 20, height 5\' 3"  (1.6 m), weight 73.9 kg, last menstrual period 09/24/2017, SpO2 100 %. Physical Exam  Constitutional: She appears well-developed and well-nourished.  HENT:  Mouth/Throat: Oropharynx is clear and moist.  Eyes: Conjunctivae are normal. No scleral icterus.  Neck: No thyromegaly present.  Cardiovascular: Normal rate, regular rhythm and normal heart sounds.  No murmur heard. Respiratory: Effort normal and breath sounds normal.  GI:  Abdomen is symmetrical.  She has laparoscopy scars across lower abdomen.  Abdomen is soft and nontender with organomegaly or masses  Musculoskeletal:        General: No edema.  Lymphadenopathy:    She has no cervical adenopathy.  Neurological: She is alert.  Skin: Skin is warm and dry.     Assessment/Plan Rectal pain. Diagnostic colonoscopy.  Hildred Laser, MD 09/12/2018, 2:32 PM

## 2018-09-12 NOTE — Op Note (Signed)
[facility] Patient Name: Kristy Franklin Procedure Date: 09/12/2018 2:15 PM MRN: [id number] Date of Birth: [birth year] Attending MD: Hildred Laser , MD CSN: [id number] Age: 48 Admit Type: Outpatient Procedure:                Colonoscopy Indications:              Rectal pain Providers:                Hildred Laser, MD, Jeanann Lewandowsky. Sharon Seller, RN, Raphael Gibney, Technician Referring MD:             Doree Albee, MD Medicines:                Meperidine 50 mg IV, Midazolam 8 mg IV Complications:            No immediate complications. Estimated Blood Loss:     Estimated blood loss: none. Procedure:                Pre-Anesthesia Assessment:                           - Prior to the procedure, a History and Physical                            was performed, and patient medications and                            allergies were reviewed. The patient's tolerance of                            previous anesthesia was also reviewed. The risks                            and benefits of the procedure and the sedation                            options and risks were discussed with the patient.                            All questions were answered, and informed consent                            was obtained. ASA Grade Assessment: II - A patient                            with mild systemic disease. After reviewing the                            risks and benefits, the patient was deemed in                            satisfactory condition to undergo the procedure.  After obtaining informed consent, the colonoscope                            was passed under direct vision. Throughout the                            procedure, the patient's blood pressure, pulse, and                            oxygen saturations were monitored continuously. The                            [id number]-[id number] ([id number]) scope was introduced through       the anus and advanced to the the cecum, identified                            by appendiceal orifice and ileocecal valve. The                            colonoscopy was somewhat difficult due to                            restricted mobility of the colon. The patient                            tolerated the procedure well. The quality of the                            bowel preparation was excellent. The ileocecal                            valve, appendiceal orifice, and rectum were                            photographed. Scope In: 2:43:55 PM Scope Out: 3:07:24 PM Scope Withdrawal Time: 0 hours 9 minutes 16 seconds  Total Procedure Duration: 0 hours 23 minutes 29 seconds  Findings:      The perianal and digital rectal examinations were normal.      Multiple small-mouthed diverticula were found in the hepatic flexure.      Multiple medium-mouthed diverticula were found in the sigmoid colon.      External and internal hemorrhoids were found during retroflexion. The       hemorrhoids were small.      Anal papilla(e) were hypertrophied. Impression:               - Diverticulosis at the hepatic flexure.                           - Diverticulosis in the sigmoid colon.                           - External and internal hemorrhoids.                           - Anal papilla(e)  were hypertrophied.                           - No specimens collected.                           Comment: suspect nerve pain or spasm. Moderate Sedation:      Moderate (conscious) sedation was administered by the endoscopy nurse       and supervised by the endoscopist. The following parameters were       monitored: oxygen saturation, heart rate, blood pressure, CO2       capnography and response to care. Total physician intraservice time was       30 minutes. Recommendation:           - Patient has a contact number available for                            emergencies. The signs and symptoms of potential                             delayed complications were discussed with the                            patient. Return to normal activities tomorrow.                            Written discharge instructions were provided to the                            patient.                           - High fiber diet today.                           - Continue present medications.                           - Amitryptiline 25 mg po qhs.                           - Repeat colonoscopy in 10 years.                           - Return to GI office in 1 month. Procedure Code(s):        --- Professional ---                           [id number], Colonoscopy, flexible; diagnostic, including                            collection of specimen(s) by brushing or washing,                            when performed (separate procedure)  47096, Moderate sedation; each additional 15                            minutes intraservice time                           G0500, Moderate sedation services provided by the                            same physician or other qualified health care                            professional performing a gastrointestinal                            endoscopic service that sedation supports,                            requiring the presence of an independent trained                            observer to assist in the monitoring of the                            patient's level of consciousness and physiological                            status; initial 15 minutes of intra-service time;                            patient age 63 years or older (additional time may                            be reported with [id number], as appropriate) Diagnosis Code(s):        --- Professional ---                           K64.8, Other hemorrhoids                           K62.89, Other specified diseases of anus and rectum                           K57.30, Diverticulosis of large intestine without                             perforation or abscess without bleeding CPT copyright 2019 [employer]. All rights reserved. The codes documented in this report are preliminary and upon coder review may  be revised to meet current compliance requirements. Hildred Laser, MD Hildred Laser, MD 09/12/2018 3:24:02 PM This report has been signed electronically. Number of Addenda: 0

## 2018-09-16 ENCOUNTER — Telehealth (INDEPENDENT_AMBULATORY_CARE_PROVIDER_SITE_OTHER): Payer: Self-pay | Admitting: *Deleted

## 2018-09-16 NOTE — Telephone Encounter (Signed)
Per [facility] op note, patient needs OV in 1 month

## 2018-09-18 ENCOUNTER — Encounter (HOSPITAL_COMMUNITY): Payer: Self-pay | Admitting: Internal Medicine

## 2018-11-06 ENCOUNTER — Telehealth: Payer: Self-pay | Admitting: *Deleted

## 2018-11-06 NOTE — Telephone Encounter (Signed)
Called and spoke with the patient. Canceled her scan due to no insurance authorization. Explained that we will call her back once approved

## 2018-11-07 ENCOUNTER — Ambulatory Visit (HOSPITAL_COMMUNITY): Admission: RE | Admit: 2018-11-07 | Payer: PRIVATE HEALTH INSURANCE | Source: Ambulatory Visit

## 2018-11-08 ENCOUNTER — Ambulatory Visit (HOSPITAL_COMMUNITY): Payer: PRIVATE HEALTH INSURANCE

## 2018-11-12 ENCOUNTER — Ambulatory Visit (HOSPITAL_COMMUNITY): Admission: RE | Admit: 2018-11-12 | Payer: PRIVATE HEALTH INSURANCE | Source: Ambulatory Visit

## 2018-11-12 ENCOUNTER — Other Ambulatory Visit: Payer: Self-pay

## 2018-11-12 ENCOUNTER — Encounter (INDEPENDENT_AMBULATORY_CARE_PROVIDER_SITE_OTHER): Payer: Self-pay | Admitting: Internal Medicine

## 2018-11-12 ENCOUNTER — Ambulatory Visit (INDEPENDENT_AMBULATORY_CARE_PROVIDER_SITE_OTHER): Payer: PRIVATE HEALTH INSURANCE | Admitting: Internal Medicine

## 2018-11-12 ENCOUNTER — Telehealth: Payer: Self-pay | Admitting: Oncology

## 2018-11-12 ENCOUNTER — Telehealth: Payer: Self-pay | Admitting: *Deleted

## 2018-11-12 VITALS — BP 140/100 | HR 72 | Ht 63.0 in | Wt 164.8 lb

## 2018-11-12 DIAGNOSIS — R202 Paresthesia of skin: Secondary | ICD-10-CM | POA: Diagnosis not present

## 2018-11-12 DIAGNOSIS — I10 Essential (primary) hypertension: Secondary | ICD-10-CM

## 2018-11-12 DIAGNOSIS — C562 Malignant neoplasm of left ovary: Secondary | ICD-10-CM

## 2018-11-12 HISTORY — DX: Essential (primary) hypertension: I10

## 2018-11-12 NOTE — Telephone Encounter (Signed)
Called and gave new appts for 9/25

## 2018-11-12 NOTE — Telephone Encounter (Signed)
Called patient's insurance, [employer] at [phone] and spoke to Dr. Molli Knock for peer to peer.  Explained that the repeat CT is to monitor the fluid collections see on the CT from 08/15/2018.  Dr. Molli Knock said the CT was denied because they do not have the report from the CT on 08/15/2018.    She reopened the case and requested for the report be faxed to [phone]. They will make a decision and fax it back to [place] today.  Tracking number for the case is [id number].

## 2018-11-12 NOTE — Progress Notes (Signed)
Wellness Office Visit  Subjective:  Patient ID: Kristy Franklin, female    DOB: 03/31/1970  Age: 48 y.o. MRN: [id number]  CC: This lady comes in for follow-up of hypertension, obesity. HPI Overall, she is doing reasonably well but she is now complaining of a headache.  She is also complaining of bilateral hand paresthesia which usually occurs in the morning and she has to shake her hands to feel better. She wonders whether starting bioidentical hormone therapy has something to do with this.  The symptoms been present for about 2 months.  Past Medical History:  Diagnosis Date  . Abdominal pain    Right mid only with bowel movements  . Blood in urine   . Change in bowel movement    more green color  . Essential hypertension, benign 11/12/2018  . Family history of prostate cancer   . GERD (gastroesophageal reflux disease)   . Headache    after menstrual cycle every month  . History of gallstones   . Hypertension   . Neck pain on right side    pulsating  . Numbness and tingling of right leg   . Obesity   . Ovarian cancer, left ([place])   . PPD positive   . Skin rash    Chest, forehead, left arm      Family History  Problem Relation Age of Onset  . Hypertension Mother   . Prostate cancer Father 60       d. 55  . Diabetes Paternal Aunt   . Heart attack Paternal Aunt   . Heart attack Paternal Grandmother 95  . Thyroid disease Other 50    Social History   Social History Narrative   Married 26 years.Lives with husband and daughter.Homemaker.     Current Meds  Medication Sig  . acetaminophen (TYLENOL) 500 MG tablet Take 2 tablets (1,000 mg total) by mouth every 6 (six) hours. (Patient taking differently: Take 1,000 mg by mouth every 6 (six) hours as needed (pain/headaches.). )  . Cholecalciferol (VITAMIN D-3) 125 MCG (5000 UT) TABS Take 5,000 Units by mouth daily.  Marland Kitchen estradiol (ESTRACE) 1 MG tablet Take 1 mg by mouth daily.   Marland Kitchen ibuprofen (ADVIL,MOTRIN) 200 MG  tablet Take 200-400 mg by mouth every 6 (six) hours as needed (pain/headaches).   Marland Kitchen lisinopril (PRINIVIL,ZESTRIL) 10 MG tablet Take 10 mg by mouth daily.   . progesterone (PROMETRIUM) 200 MG capsule Take 200 mg by mouth at bedtime.      Nutrition  She continues to eat well with intermittent fasting and eating healthy, along with her husband. Sleep  7 -8 hours uninterrupted sleep  Exercise  Walking every other day 45 minutes and stationary bicycle everyday for 30 minutes. Bio Identical Hormones  Estradiol is being used in this patient for multiple benefits based on several studies including protection against heart disease, cerebrovascular disease, osteoporosis, colon cancer, Alzheimer's disease, macular degeneration and cataracts. The patient has been counseled regarding benefits and side effects and modes of administration. The patient is agreeable that this therapy is an integral to part of her wellness, quality of life and prevention of chronic disease.  Micronized progesterone is being used in this patient for multiple benefits based on studies including protection against uterine cancer, breast cancer, osteoporosis and heart disease. The patient has been counseled regarding side effects, benefits and modes of administration. The patient is agreeable that this therapy is an integral part of her wellness, quality of life and prevention of chronic disease.  Objective:   Today's Vitals: BP (!) 140/100   Pulse 72   Ht 5\' 3"  (1.6 m)   Wt 164 lb 12.8 oz (74.8 kg)   LMP 09/24/2017   BMI 29.19 kg/m  Vitals with BMI 11/12/2018 09/12/2018 09/12/2018  Height 5\' 3"  - -  Weight 164 lbs 13 oz - -  BMI [id number] - -  Systolic [id number]  Diastolic [id number] 61 42  Pulse 72 - 64     Physical Exam  She looks systemically well, she does not have evidence of Tinel's  sign.  She does not appear to have thenar eminence wasting.  I see that her blood pressure is uncontrolled.     Assessment   1.  Malignant neoplasm of left ovary (HCC)   2. Essential hypertension, benign   3. Paresthesia of both hands      Plan: 1. I am going to increase her lisinopril to 20 mg daily so she will take 2 tablets daily. 2. I told her that I am not convinced that the hormones are causing the side effects but I recommended that she stop estradiol and progesterone for the time being to see if her symptoms improve.  If they do not improve, I will likely refer her for nerve conduction studies. 3. I will see her in about a month's time to see how she is doing.  Tests ordered No orders of the defined types were placed in this encounter.    Doree Albee, MD

## 2018-11-12 NOTE — Patient Instructions (Signed)
STOP ESTRADIOL AND PROGESTERONE.Marland Kitchen INCREASE LISINOPRIL TO 20mg  daily.

## 2018-11-13 ENCOUNTER — Inpatient Hospital Stay: Payer: PRIVATE HEALTH INSURANCE

## 2018-11-13 ENCOUNTER — Inpatient Hospital Stay: Payer: PRIVATE HEALTH INSURANCE | Admitting: Gynecologic Oncology

## 2018-11-13 NOTE — Telephone Encounter (Signed)
Called [employer] to check status of authorization.  They said it is in clinical review and that we will be notified once a decision has been made.

## 2018-11-17 NOTE — Progress Notes (Signed)
Subjective:    Patient ID: Kristy Franklin, female    DOB: Jan 07, 1971, 48 y.o.   MRN: [id number]  HPI Kristy Franklin is a 48 year old female with a past medical history of hypertension, obesity, paresthesias to her hands, GERD, ovarian cancer 10/02/2017 s/p robotic assisted laparoscopic total hysterectomy with bilateral salping- oophorectomy, omentectomy, bilateral pelvic lymphadenectomy s/p 6 cycles of chemotherapy followed by Dr. Heath Lark.  She presents today for follow-up after having a colonoscopy done 09/12/2018 due to having rectal pain.  The colonoscopy identified diverticulosis to the sigmoid colon, external and internal hemorrhoids, hypertrophied anal papilla and no evidence of colitis.  Biopsies were not obtained.  A repeat colonoscopy in 10 years was recommended.  Colonoscopy results did not identify the cause of her rectal pain.  She was assessed to possibly have neuropathic type pain and she was prescribed amitriptyline 25 mg 1 tab at bedtime.  She stated her rectal pain improved on the amitriptyline but she was too drowsy in the morning throughout the early afternoon.  She is now taking the amitriptyline every other day with less drowsiness.  The day she does not take the amitriptyline she does notice more rectal discomfort.  The rectal discomfort occurs throughout the day and does not improve after defecation.  She is passing a soft to loose bowel movement once or twice daily.  She does not feel emptied.  Rectal bleeding or melena.  Reports having lower spinal stenosis and questions if this attributes to her rectal pain.  She continues with her surveillance abdominal CT imaging.  See results below.  No current GERD symptoms.   Abdominal/pelvic CT 08/15/2018: 1. There are 2 fluid density collections in the pelvis. One is along the left pelvic sidewall at the site of the large recently drained pelvic fluid collection, but currently this measures only 1.6 cubic cm. A second collection is  along the top of the vaginal cuff and is new compared to the prior exam, measuring 17 cubic cm. Given the mucinous nature of the patient's original ovarian cancer, mucinous neoplastic fluid collections cannot be totally excluded, and sampling or close surveillance may be indicated. 2. Small type 1 hiatal hernia. 3. Descending and sigmoid colon diverticulosis.  She is scheduled for a repeat abd/pelvic CT 11/19/2018  Past Medical History:  Diagnosis Date  . Abdominal pain    Right mid only with bowel movements  . Blood in urine   . Change in bowel movement    more green color  . Essential hypertension, benign 11/12/2018  . Family history of prostate cancer   . GERD (gastroesophageal reflux disease)   . Headache    after menstrual cycle every month  . History of gallstones   . Hypertension   . Neck pain on right side    pulsating  . Numbness and tingling of right leg   . Obesity   . Ovarian cancer, left ([place])   . PPD positive   . Skin rash    Chest, forehead, left arm   Past Surgical History:  Procedure Laterality Date  . CESAREAN SECTION     x2  . CHOLECYSTECTOMY     20 years ago  . COLONOSCOPY N/A 09/12/2018   Procedure: COLONOSCOPY;  Surgeon: Rogene Houston, MD;  Location: [facility];  Service: Endoscopy;  Laterality: N/A;  200  . IR IMAGING GUIDED PORT INSERTION  10/24/2017  . OMENTECTOMY N/A 10/02/2017   Procedure: OMENTECTOMY;  Surgeon: Everitt Amber, MD;  Location: [facility];  Service: Gynecology;  Laterality: N/A;  . OTHER SURGICAL HISTORY  07/2017   Left ovary removed  . ROBOTIC ASSISTED TOTAL HYSTERECTOMY WITH BILATERAL SALPINGO OOPHERECTOMY Right 10/02/2017   Procedure: XI ROBOTIC ASSISTED TOTAL HYSTERECTOMY WITH RIGHT SALPINGO OOPHORECTOMY;  Surgeon: Everitt Amber, MD;  Location: [facility];  Service: Gynecology;  Laterality: Right;  . ROBOTIC PELVIC AND PARA-AORTIC LYMPH NODE DISSECTION Bilateral 10/02/2017   Procedure: XI ROBOTIC PELVIC LYMPH NODE DISSECTION;  Surgeon:  Everitt Amber, MD;  Location: [facility];  Service: Gynecology;  Laterality: Bilateral;   Current Outpatient Medications on File Prior to Visit  Medication Sig Dispense Refill  . acetaminophen (TYLENOL) 500 MG tablet Take 2 tablets (1,000 mg total) by mouth every 6 (six) hours. (Patient taking differently: Take 1,000 mg by mouth every 6 (six) hours as needed (pain/headaches.). ) 30 tablet 0  . Cholecalciferol (VITAMIN D-3) 125 MCG (5000 UT) TABS Take 5,000 Units by mouth daily.    . hydrocortisone (ANUSOL-HC) 2.5 % rectal cream Place rectally 2 (two) times daily. (Patient taking differently: Place 1 application rectally 2 (two) times daily as needed for hemorrhoids. ) 30 g 1  . HYDROCORTISONE EX Apply 1 application topically 2 (two) times daily as needed (itching.).    Marland Kitchen ibuprofen (ADVIL,MOTRIN) 200 MG tablet Take 200-400 mg by mouth every 6 (six) hours as needed (pain/headaches).     . lidocaine-prilocaine (EMLA) cream Apply to affected area once (Patient taking differently: Apply 1 application topically daily as needed (prior to port accessed). ) 30 g 3  . lisinopril (PRINIVIL,ZESTRIL) 10 MG tablet Take 10 mg by mouth daily.   1  . mometasone (ELOCON) 0.1 % cream Apply 1 application topically 2 (two) times daily as needed (rash).   12  . senna-docusate (SENOKOT-S) 8.6-50 MG tablet Take 2 tablets by mouth at bedtime. (Patient taking differently: Take 2 tablets by mouth daily as needed (constipation.). ) 30 tablet 0   No current facility-administered medications on file prior to visit.    Allergies  Allergen Reactions  . Other Rash    Pork    Review of Systems see HPI, all other systems reviewed and are negative     Objective:   Physical Exam  Blood pressure 120/75, pulse 60, temperature 98.4 F (36.9 C), height 5\' 3"  (1.6 m), weight 165 lb 3.2 oz (74.9 kg), last menstrual period 09/24/2017. General: 48 year old female in no acute distress Eyes: Sclera nonicteric, conjunctiva pink Heart:  Regular rate and rhythm, no murmurs Lungs: Breath sounds clear throughout Chest: Right subclavian port intact Abdomen: Soft, mild tenderness right mid abdominal area without rebound or guarding, no mass or organomegaly, positive bowel sounds to all 4 quadrants, robotic scars intact Rectal: Patient declined exam Extremities: No edema Neuro: Alert and oriented x4, no focal deficits     Assessment & Plan:   1. 48 y.o. female with a history of a ovarian cancer 10/02/2017 s/p robotic assisted laparoscopic total hysterectomy with bilateral salping- oophorectomy, omentectomy, bilateral pelvic lymphadenectomy s/p 6 cycles of chemotherapy presents for follow-up regarding rectal pain -Decrease Amitriptyline to 10 mg 1 or 2 tabs at bedtime as tolerated If symptoms persist will consider evaluation with colorectal surgeon for anal manometry and rectal physical therapy -Avoid constipation, docusate sodium 100 mg once daily or MiraLAX as needed -Follow-up in office in 3 to 4 months  2. GERD, stable -limit NSAID use

## 2018-11-19 ENCOUNTER — Other Ambulatory Visit: Payer: Self-pay

## 2018-11-19 ENCOUNTER — Ambulatory Visit (INDEPENDENT_AMBULATORY_CARE_PROVIDER_SITE_OTHER): Payer: PRIVATE HEALTH INSURANCE | Admitting: Nurse Practitioner

## 2018-11-19 ENCOUNTER — Encounter (INDEPENDENT_AMBULATORY_CARE_PROVIDER_SITE_OTHER): Payer: Self-pay | Admitting: Nurse Practitioner

## 2018-11-19 ENCOUNTER — Ambulatory Visit (HOSPITAL_COMMUNITY)
Admission: RE | Admit: 2018-11-19 | Discharge: 2018-11-19 | Disposition: A | Discharge status: Home / Self Care | Payer: PRIVATE HEALTH INSURANCE | Source: Ambulatory Visit | Attending: Gynecologic Oncology | Admitting: Gynecologic Oncology

## 2018-11-19 VITALS — BP 120/75 | HR 60 | Temp 98.4°F | Ht 63.0 in | Wt 165.2 lb

## 2018-11-19 DIAGNOSIS — C562 Malignant neoplasm of left ovary: Secondary | ICD-10-CM | POA: Insufficient documentation

## 2018-11-19 DIAGNOSIS — K6289 Other specified diseases of anus and rectum: Secondary | ICD-10-CM

## 2018-11-19 DIAGNOSIS — R188 Other ascites: Secondary | ICD-10-CM | POA: Insufficient documentation

## 2018-11-19 MED ORDER — IOHEXOL 300 MG/ML  SOLN
100.0000 mL | Freq: Once | INTRAMUSCULAR | Status: AC | PRN
  Administered 2018-11-19: 100 mL via INTRAVENOUS

## 2018-11-19 MED ORDER — AMITRIPTYLINE HCL 10 MG PO TABS: 20.0000 mg | ORAL_TABLET | Freq: Every day | ORAL | 0 refills | Status: DC

## 2018-11-19 MED ORDER — SODIUM CHLORIDE (PF) 0.9 % IJ SOLN
INTRAMUSCULAR | Status: AC
  Filled 2018-11-19: qty 50

## 2018-11-19 NOTE — Patient Instructions (Signed)
1.  Take lower dose of amitriptyline.  Take amitriptyline 10 mg 1 or 2 tabs at bedtime as tolerated.  2.  Call Wyat Infinger in 2 weeks with symptom update, if rectal pain continues will schedule consult with rectal specialist for anal manometry and rectal physical therapy  3.  Avoid constipation.  May take docusate sodium which is a stool softener 100 mg 1 capsule at bedtime or MiraLAX 1 capful mixed in 8 ounces of water at bedtime as tolerated, okay to take every night  4.  Follow-up in the office in 3 to 4 months

## 2018-11-20 ENCOUNTER — Telehealth: Payer: Self-pay

## 2018-11-20 NOTE — Telephone Encounter (Signed)
Told Ms Schalow that that the CT showed complete resolution of the pelvic fluid collection. No evidence of metastatic disease in the abdomen and pelvis. Pt to keep her follow up this Friday with Dr. Denman George as scheduled.

## 2018-11-22 ENCOUNTER — Inpatient Hospital Stay: Payer: PRIVATE HEALTH INSURANCE

## 2018-11-22 ENCOUNTER — Encounter: Payer: Self-pay | Admitting: Gynecologic Oncology

## 2018-11-22 ENCOUNTER — Other Ambulatory Visit: Payer: Self-pay

## 2018-11-22 ENCOUNTER — Inpatient Hospital Stay: Payer: PRIVATE HEALTH INSURANCE | Attending: Gynecologic Oncology | Admitting: Gynecologic Oncology

## 2018-11-22 VITALS — BP 143/47 | HR 67 | Temp 98.5°F | Resp 18 | Ht 63.0 in | Wt 168.1 lb

## 2018-11-22 DIAGNOSIS — Z8542 Personal history of malignant neoplasm of other parts of uterus: Secondary | ICD-10-CM | POA: Diagnosis not present

## 2018-11-22 DIAGNOSIS — Z08 Encounter for follow-up examination after completed treatment for malignant neoplasm: Secondary | ICD-10-CM | POA: Insufficient documentation

## 2018-11-22 DIAGNOSIS — K219 Gastro-esophageal reflux disease without esophagitis: Secondary | ICD-10-CM | POA: Insufficient documentation

## 2018-11-22 DIAGNOSIS — E669 Obesity, unspecified: Secondary | ICD-10-CM | POA: Diagnosis not present

## 2018-11-22 DIAGNOSIS — Z79899 Other long term (current) drug therapy: Secondary | ICD-10-CM | POA: Insufficient documentation

## 2018-11-22 DIAGNOSIS — Z9221 Personal history of antineoplastic chemotherapy: Secondary | ICD-10-CM | POA: Insufficient documentation

## 2018-11-22 DIAGNOSIS — C562 Malignant neoplasm of left ovary: Secondary | ICD-10-CM

## 2018-11-22 DIAGNOSIS — C541 Malignant neoplasm of endometrium: Secondary | ICD-10-CM

## 2018-11-22 DIAGNOSIS — Z8543 Personal history of malignant neoplasm of ovary: Secondary | ICD-10-CM | POA: Insufficient documentation

## 2018-11-22 DIAGNOSIS — Z90722 Acquired absence of ovaries, bilateral: Secondary | ICD-10-CM | POA: Diagnosis not present

## 2018-11-22 DIAGNOSIS — I10 Essential (primary) hypertension: Secondary | ICD-10-CM | POA: Insufficient documentation

## 2018-11-22 DIAGNOSIS — Z90710 Acquired absence of both cervix and uterus: Secondary | ICD-10-CM | POA: Diagnosis not present

## 2018-11-22 MED ORDER — HEPARIN SOD (PORK) LOCK FLUSH 100 UNIT/ML IV SOLN
250.0000 [IU] | Freq: Once | INTRAVENOUS | Status: AC
  Administered 2018-11-22: 12:00:00 250 [IU]
  Filled 2018-11-22: qty 5

## 2018-11-22 MED ORDER — SODIUM CHLORIDE 0.9% FLUSH
10.0000 mL | Freq: Once | INTRAVENOUS | Status: AC
  Administered 2018-11-22: 12:00:00 10 mL
  Filled 2018-11-22: qty 10

## 2018-11-22 NOTE — Progress Notes (Signed)
Follow-up Note: Gyn-Onc  Consult was intially requested by Dr. Barrie Dunker for the evaluation of Kristy Franklin 48 y.o. female  CC:  Chief Complaint  Patient presents with  . Ovarian Cancer  . uterine cancer   Seen with translator (spanish)  Assessment/Plan:  [name]  is a 48 y.o.  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 BRCA negative.  S/p completion of chemotherapy with 6 cycles adjuvant carboplatin and paclitaxel in December, 2019.  No apparent recurrence   Will see her back at 3 monthly intervals with CA 125.   HPI: [name] is a 48 year old P2 who is seen in consultation at the request of Dr Barrie Dunker for high grade serous ovarian cancer, clinical stage IC.  The patient reports a history of having seen the emergency room in [place] in May or June 2019 when she developed left lower quadrant pain.  During the ER visit an ultrasound performed which identified an adnexal mass on the left, with no signs of torsion.  She was then seen in the offices of Dr. Barrie Dunker in June 2019 and a plan was made to proceed with surgical removal of the adnexa.  On August 23, 2017 she was taken to the operating room for a laparoscopic left salpingo-oophorectomy.  Review of the operative note suggest that intraoperative findings were significant for a 6 cm left adnexal mass that was stuck in the cul-de-sac with some paraovarian adhesions noted.  It did not appear to be malignant in appearance to the surgeons at the time of surgery.  The upper abdomen, diaphragms, and omentum were commented on as looking normal.  There was no ascites.  The left tube and ovary was removed, however there was some fragmentation of the specimen during removal, and therefore the left tube and ovary were sent as 2 specimens.  The first specimen is labeled ovary biopsy for frozen section which revealed serous carcinoma  and the second specimen is labeled left ovary on permanent this revealed serous carcinoma.  The operative note reports that the intraoperative diagnosis was borderline tumor, or reviewing the pathology report it states that the intraoperative diagnosis was left ovarian biopsy epithelial neoplasm at least borderline, cannot exclude carcinoma.  The frozen section of the second specimen was the same (epithelial neoplasm at least borderline cannot exclude carcinoma.).  The patient was seen back in the office by Dr. Barrie Dunker on September 06, 2017 and delivered her diagnosis of carcinoma.  At that time she was referred for consultation with gynecologic oncology.  Her past medical history 6 significant for obesity and hypertension.  She has had 2 prior cesarean deliveries.  She does not desire future fertility as she is completed childbearing.  She has no history of abnormal Pap smears and her last Pap was 7 to 8 months ago was normal.  Her past surgical history is significant for 2 cesarean sections and a laparoscopic cholecystectomy.  Her only family history is significant for father with prostate cancer at age 35.  On 10/02/17 she underwent robotic assisted total hysterectomy, RSO, omentectomy, bilateral pelvic lymphadenectomy (PA node dissection not feasible due to body habitus, and no suspicious nodes on preop imaging). Final pathology revealed residual carcinoma on the surface of the uterus (serosa) and separate additional endometrioid carcinoma in endometrium (stage I, thought to be second primary). Metastatic carcinoma (consistent with serous) in one of 5 left pelvic lymph nodes (right side  all benign). Omentum was benign (though atypical cells were identified in peritoneal washings).  CT chest/abd/pelvis on 09/24/17 (prior to staging surgery) showed no evidence of gross metastatic disease.  She was determined to most likely have synchronous endometrial (stage I) and ovarian (stage IIIC) cancers of endometrioid  and high grade serous histologies respectively after pathology and multidisciplinary conference review. Recommendation was for 6 cycles of adjuvant carboplatin and paclitaxel chemotherapy with genetics consultation.  She commenced chemotherapy with Dr Alvy Bimler on 10/26/17.  BRCA and HRD testing negative.   She completed 6 cycles of adjuvant carboplatin paclitaxel chemotherapy on February 12, 2018.  Posttreatment Ca1 25 was normal at 9.3.  She began developing mid right abdominal wall discomfort in November December 2019.  A CT abd/pelvis on March 13 2018 which showed a 12.3 cm left pelvic sidewall cystic structure favoring lymphocele.  This was drained by CT guided drainage on therapy for 2020.  The patient reported her symptoms improved after this time.  Ca1 25 on June 20, 2018 was normal at 8.2.  Interval Hx:   CT abd/pelvis on 11/19/18 showed no residual fluid collections and no recurrence of cancer.  She has persistent back pains but no new complaints.   Current Meds:  Outpatient Encounter Medications as of 11/22/2018  Medication Sig  . acetaminophen (TYLENOL) 500 MG tablet Take 2 tablets (1,000 mg total) by mouth every 6 (six) hours. (Patient taking differently: Take 1,000 mg by mouth every 6 (six) hours as needed (pain/headaches.). )  . amitriptyline (ELAVIL) 10 MG tablet Take 2 tablets (20 mg total) by mouth at bedtime.  . Cholecalciferol (VITAMIN D-3) 125 MCG (5000 UT) TABS Take 5,000 Units by mouth daily.  . hydrocortisone (ANUSOL-HC) 2.5 % rectal cream Place rectally 2 (two) times daily. (Patient taking differently: Place 1 application rectally 2 (two) times daily as needed for hemorrhoids. )  . HYDROCORTISONE EX Apply 1 application topically 2 (two) times daily as needed (itching.).  Marland Kitchen ibuprofen (ADVIL,MOTRIN) 200 MG tablet Take 200-400 mg by mouth every 6 (six) hours as needed (pain/headaches).   . lidocaine-prilocaine (EMLA) cream Apply to affected area once (Patient taking  differently: Apply 1 application topically daily as needed (prior to port accessed). )  . lisinopril (PRINIVIL,ZESTRIL) 10 MG tablet Take 10 mg by mouth daily.   . mometasone (ELOCON) 0.1 % cream Apply 1 application topically 2 (two) times daily as needed (rash).   . senna-docusate (SENOKOT-S) 8.6-50 MG tablet Take 2 tablets by mouth at bedtime. (Patient taking differently: Take 2 tablets by mouth daily as needed (constipation.). )  . [EXPIRED] heparin lock flush 100 unit/mL   . [EXPIRED] sodium chloride flush (NS) 0.9 % injection 10 mL   . [DISCONTINUED] sodium chloride (PF) 0.9 % injection    No facility-administered encounter medications on file as of 11/22/2018.     Allergy:  Allergies  Allergen Reactions  . Other Rash    Pork    Social Hx:   Social History   Socioeconomic History  . Marital status: Married    Spouse name: Media planner  . Number of children: 2  . Years of education: Not on file  . Highest education level: Not on file  Occupational History  . Occupation: unemployed  Social Needs  . Financial resource strain: Not on file  . Food insecurity    Worry: Not on file    Inability: Not on file  . Transportation needs    Medical: Not on file    Non-medical: Not  on file  Tobacco Use  . Smoking status: Never Smoker  . Smokeless tobacco: Never Used  Substance and Sexual Activity  . Alcohol use: Never    Frequency: Never  . Drug use: Never  . Sexual activity: Not Currently    Comment: calendar  Lifestyle  . Physical activity    Days per week: Not on file    Minutes per session: Not on file  . Stress: Not on file  Relationships  . Social Herbalist on phone: Not on file    Gets together: Not on file    Attends religious service: Not on file    Active member of club or organization: Not on file    Attends meetings of clubs or organizations: Not on file    Relationship status: Not on file  . Intimate partner violence    Fear of current or ex  partner: Not on file    Emotionally abused: Not on file    Physically abused: Not on file    Forced sexual activity: Not on file  Other Topics Concern  . Not on file  Social History Narrative   Married 26 years.Lives with husband and daughter.Homemaker.    Past Surgical Hx:  Past Surgical History:  Procedure Laterality Date  . CESAREAN SECTION     x2  . CHOLECYSTECTOMY     20 years ago  . COLONOSCOPY N/A 09/12/2018   Procedure: COLONOSCOPY;  Surgeon: Rogene Houston, MD;  [facility];  Service: Endoscopy;  Laterality: N/A;  200  . IR IMAGING GUIDED PORT INSERTION  10/24/2017  . OMENTECTOMY N/A 10/02/2017   Procedure: OMENTECTOMY;  Surgeon: Everitt Amber, MD;  Location: [facility];  Service: Gynecology;  Laterality: N/A;  . OTHER SURGICAL HISTORY  07/2017   Left ovary removed  . ROBOTIC ASSISTED TOTAL HYSTERECTOMY WITH BILATERAL SALPINGO OOPHERECTOMY Right 10/02/2017   Procedure: XI ROBOTIC ASSISTED TOTAL HYSTERECTOMY WITH RIGHT SALPINGO OOPHORECTOMY;  Surgeon: Everitt Amber, MD;  Location: [facility];  Service: Gynecology;  Laterality: Right;  . ROBOTIC PELVIC AND PARA-AORTIC LYMPH NODE DISSECTION Bilateral 10/02/2017   Procedure: XI ROBOTIC PELVIC LYMPH NODE DISSECTION;  Surgeon: Everitt Amber, MD;  [facility];  Service: Gynecology;  Laterality: Bilateral;    Past Medical Hx:  Past Medical History:  Diagnosis Date  . Abdominal pain    Right mid only with bowel movements  . Blood in urine   . Change in bowel movement    more green color  . Essential hypertension, benign 11/12/2018  . Family history of prostate cancer   . GERD (gastroesophageal reflux disease)   . Headache    after menstrual cycle every month  . History of gallstones   . Hypertension   . Neck pain on right side    pulsating  . Numbness and tingling of right leg   . Obesity   . Ovarian cancer, left ([place])   . PPD positive   . Skin rash    Chest, forehead, left arm    Past Gynecological History:  C/s x  2. No history of abnormal paps.  Patient's last menstrual period was 09/24/2017.  Family Hx:  Family History  Problem Relation Age of Onset  . Hypertension Mother   . Prostate cancer Father 38       d. 8  . Diabetes Paternal Aunt   . Heart attack Paternal Aunt   . Heart attack Paternal Grandmother 95  . Thyroid disease Other 14  Review of Systems:  Constitutional  Feels well,    ENT Normal appearing ears and nares bilaterally Skin/Breast  No rash, sores, jaundice, itching, dryness Cardiovascular  No chest pain, shortness of breath, or edema  Pulmonary  No cough or wheeze.  Gastro Intestinal  No nausea, vomitting, or diarrhoea. No bright red blood per rectum, change in bowel movement, or constipation. + abdominal pain for 2 months. Intermittent Genito Urinary  No frequency, urgency, dysuria, no pain, no bleeding. Musculo Skeletal  No myalgia, arthralgia, joint swelling or pain  Neurologic  No weakness, numbness, change in gait,  Psychology  No depression, anxiety, insomnia.   Vitals:  Blood pressure (!) 143/47, pulse 67, temperature 98.5 F (36.9 C), temperature source Temporal, resp. rate 18, height '5\' 3"'$  (1.6 m), weight 168 lb 2 oz (76.3 kg), last menstrual period 09/24/2017, SpO2 100 %.  Physical Exam: WD in NAD Neck  Supple NROM, without any enlargements.  Lymph Node Survey No cervical supraclavicular or inguinal adenopathy Cardiovascular  Pulse normal rate, regularity and rhythm. S1 and S2 normal.  Lungs  Clear to auscultation bilateraly, without wheezes/crackles/rhonchi. Good air movement.  Skin  No rash/lesions/breakdown  Psychiatry  Alert and oriented to person, place, and time  Abdomen  Normoactive bowel sounds, abdomen soft, non-tender and obese without evidence of hernia. Incisions well healed. Back No CVA tenderness Genito Urinary  Vaginal cuff in tact, well healed, no blood, no masses  Rectal  deferred Extremities  No bilateral cyanosis,  clubbing or edema.    Thereasa Solo, MD  11/22/2018, 12:39 PM

## 2018-11-22 NOTE — Patient Instructions (Signed)
Please notify Dr Denman George at phone number [phone] if you notice vaginal bleeding, new pelvic or abdominal pains, bloating, feeling full easy, or a change in bladder or bowel function.   Please return to see Dr Denman George in December for a follow-up visit and recheck of your CA 125.  This month's CT scan was normal.

## 2018-11-23 LAB — CA 125: Cancer Antigen (CA) 125: 7.4 U/mL (ref 0.0–38.1)

## 2018-12-17 ENCOUNTER — Other Ambulatory Visit: Payer: Self-pay

## 2018-12-17 ENCOUNTER — Ambulatory Visit (INDEPENDENT_AMBULATORY_CARE_PROVIDER_SITE_OTHER): Payer: PRIVATE HEALTH INSURANCE | Admitting: Internal Medicine

## 2018-12-17 ENCOUNTER — Encounter (INDEPENDENT_AMBULATORY_CARE_PROVIDER_SITE_OTHER): Payer: Self-pay | Admitting: Internal Medicine

## 2018-12-17 VITALS — BP 130/80 | HR 72 | Ht 63.0 in | Wt 165.6 lb

## 2018-12-17 DIAGNOSIS — I10 Essential (primary) hypertension: Secondary | ICD-10-CM

## 2018-12-17 DIAGNOSIS — Z23 Encounter for immunization: Secondary | ICD-10-CM

## 2018-12-17 DIAGNOSIS — M25511 Pain in right shoulder: Secondary | ICD-10-CM

## 2018-12-17 DIAGNOSIS — E66811 Obesity, class 1: Secondary | ICD-10-CM

## 2018-12-17 DIAGNOSIS — E669 Obesity, unspecified: Secondary | ICD-10-CM | POA: Diagnosis not present

## 2018-12-17 DIAGNOSIS — E8941 Symptomatic postprocedural ovarian failure: Secondary | ICD-10-CM

## 2018-12-17 DIAGNOSIS — E559 Vitamin D deficiency, unspecified: Secondary | ICD-10-CM | POA: Diagnosis not present

## 2018-12-17 HISTORY — DX: Obesity, unspecified: E66.9

## 2018-12-17 HISTORY — DX: Symptomatic postprocedural ovarian failure: E89.41

## 2018-12-17 HISTORY — DX: Obesity, class 1: E66.811

## 2018-12-17 NOTE — Progress Notes (Signed)
Wellness Office Visit  Subjective:  Patient ID: Kristy Franklin, female    DOB: Dec 11, 1970  Age: 48 y.o. 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 HPI  On the last visit, I recommended she increase vitamin D3 to 10,000 units daily. She also was having some issues with bioidentical hormones and so she has discontinued these.  She wonders whether she can go back on them because her focus and concentration was much better.  Her hot flashes also had improved.  I told her this would be okay. Her nutrition is good with continued intermittent fasting. She exercises on a regular basis with cycling on a stationary bicycle every day approximately 30 minutes.  She also takes walks. She denies any chest pain, dyspnea, palpitations or limb weakness. She does complain of right shoulder pain which she has had for about 2 to 3 months without any history of trauma. Past Medical History:  Diagnosis Date  . Abdominal pain    Right mid only with bowel movements  . Blood in urine   . Change in bowel movement    more green color  . Essential hypertension, benign 11/12/2018  . Family history of prostate cancer   . GERD (gastroesophageal reflux disease)   . Headache    after menstrual cycle every month  . History of gallstones   . Hot flashes due to surgical menopause 12/17/2018  . Hypertension   . Neck pain on right side    pulsating  . Numbness and tingling of right leg   . Obesity   . Obesity (BMI 30.0-34.9) 12/17/2018  . Ovarian cancer, left ([place])   . PPD positive   . Skin rash    Chest, forehead, left arm      Family History  Problem Relation Age of Onset  . Hypertension Mother   . Prostate cancer Father 80       d. 3  . Diabetes Paternal Aunt   . Heart attack Paternal Aunt   . Heart attack Paternal Grandmother 95  . Thyroid disease Other 72    Social History    Social History Narrative   Married 26 years.Lives with husband and daughter.Homemaker.     Current Meds  Medication Sig  . amitriptyline (ELAVIL) 10 MG tablet Take 2 tablets (20 mg total) by mouth at bedtime.  . Cholecalciferol (VITAMIN D-3) 125 MCG (5000 UT) TABS Take 10,000 Units by mouth daily.   Marland Kitchen estradiol (ESTRACE) 1 MG tablet Take 0.5 mg by mouth daily.   Marland Kitchen lisinopril (ZESTRIL) 20 MG tablet Take 10 mg by mouth daily.   . progesterone (PROMETRIUM) 200 MG capsule Take 200 mg by mouth daily.     Nutrition  Continues with intermittent fasting as before. Sleep  Extremely good sleep uninterrupted.  Exercise  Bicycle 30 minutes every day with walking in between. Bio Identical Hormones  Micronized progesterone is being used in this patient for multiple benefits based on studies including protection against uterine cancer, breast cancer, osteoporosis and heart disease. The patient has been counseled regarding side effects, benefits and modes of administration. The patient is agreeable that this therapy is an integral part of her wellness, quality of life and prevention of chronic disease.  Estradiol is being used in this patient for multiple benefits based on several studies including protection against heart disease, cerebrovascular disease, osteoporosis, colon cancer, Alzheimer's disease, macular degeneration and cataracts. The patient has  been counseled regarding benefits and side effects and modes of administration. The patient is agreeable that this therapy is an integral to part of her wellness, quality of life and prevention of chronic disease.  Objective:   Today's Vitals: BP 130/80   Pulse 72   Ht 5\' 3"  (1.6 m)   Wt 165 lb 9.6 oz (75.1 kg)   LMP 09/24/2017   BMI 29.33 kg/m  Vitals with BMI 12/17/2018 11/22/2018 11/19/2018  Height 5\' 3"  5\' 3"  5\' 3"   Weight 165 lbs 10 oz 168 lbs 2 oz 165 lbs 3 oz  BMI 29.34 [id number]  Systolic [id number]  Diastolic 80 47 75   Pulse 72 67 60     Physical Exam   She looks systemically well.  Blood pressure is well controlled on the current dose.  Her weight is stable.  In fact I think she has lost about 3 pounds since last visit.  She is alert and orientated without any focal neurological signs.  Examination of the right shoulder shows click/crepitus on flexion and extension.    Assessment   1. Essential hypertension, benign   2. Obesity (BMI 30.0-34.9)   3. Hot flashes due to surgical menopause   4. Vitamin D deficiency disease   5. Acute pain of right shoulder       Tests ordered Orders Placed This Encounter  Procedures  . COMPLETE METABOLIC PANEL WITH GFR  . VITAMIN D 25 Hydroxy (Vit-D Deficiency, Fractures)  . Ambulatory referral to Orthopedic Surgery     Plan: 1. She will continue with the current dose of lisinopril 20 mg daily.  I am not keen to lower it at the present time until she loses further weight. 2. She will continue nutrition and exercise as before. 3. She will restart bioidentical hormone therapy with lower dose of estradiol 0.5 mg daily and progesterone 200 mg at night. 4. She will continue vitamin D3 10,000 units daily. 5. I will refer to orthopedics for the right shoulder pain. 6. Blood work is ordered as above and further recommendations will depend on these results.  I will see her in about February of next year for an annual physical exam.  Influenza vaccination was given today   No orders of the defined types were placed in this encounter.   Doree Albee, MD

## 2018-12-18 LAB — COMPLETE METABOLIC PANEL WITH GFR
AG Ratio: 1.7 (calc) (ref 1.0–2.5)
ALT: 16 U/L (ref 6–29)
AST: 20 U/L (ref 10–35)
Albumin: 4.7 g/dL (ref 3.6–5.1)
Alkaline phosphatase (APISO): 83 U/L (ref 31–125)
BUN: 15 mg/dL (ref 7–25)
CO2: 27 mmol/L (ref 20–32)
Calcium: 10.6 mg/dL — ABNORMAL HIGH (ref 8.6–10.2)
Chloride: 103 mmol/L (ref 98–110)
Creat: 0.63 mg/dL (ref 0.50–1.10)
GFR, Est African American: 122 mL/min/{1.73_m2} (ref >=60)
GFR, Est Non African American: 105 mL/min/{1.73_m2} (ref >=60)
Globulin: 2.7 g/dL (calc) (ref 1.9–3.7)
Glucose, Bld: 92 mg/dL (ref 65–99)
Potassium: 4.4 mmol/L (ref 3.5–5.3)
Sodium: 142 mmol/L (ref 135–146)
Total Bilirubin: 0.4 mg/dL (ref 0.2–1.2)
Total Protein: 7.4 g/dL (ref 6.1–8.1)

## 2018-12-18 LAB — VITAMIN D 25 HYDROXY (VIT D DEFICIENCY, FRACTURES): Vit D, 25-Hydroxy: 55 ng/mL (ref 30–100)

## 2019-01-02 ENCOUNTER — Other Ambulatory Visit (INDEPENDENT_AMBULATORY_CARE_PROVIDER_SITE_OTHER): Payer: Self-pay | Admitting: Internal Medicine

## 2019-01-03 ENCOUNTER — Ambulatory Visit: Payer: PRIVATE HEALTH INSURANCE | Admitting: Orthopedic Surgery

## 2019-01-13 ENCOUNTER — Ambulatory Visit: Payer: PRIVATE HEALTH INSURANCE

## 2019-01-13 ENCOUNTER — Ambulatory Visit: Payer: PRIVATE HEALTH INSURANCE | Admitting: Orthopedic Surgery

## 2019-01-13 ENCOUNTER — Other Ambulatory Visit: Payer: Self-pay

## 2019-01-13 ENCOUNTER — Telehealth: Payer: Self-pay

## 2019-01-13 ENCOUNTER — Encounter: Payer: Self-pay | Admitting: Orthopedic Surgery

## 2019-01-13 VITALS — BP 116/73 | HR 65 | Temp 98.6°F | Ht 63.0 in | Wt 161.0 lb

## 2019-01-13 DIAGNOSIS — M25511 Pain in right shoulder: Secondary | ICD-10-CM

## 2019-01-13 DIAGNOSIS — M7541 Impingement syndrome of right shoulder: Secondary | ICD-10-CM | POA: Diagnosis not present

## 2019-01-13 NOTE — Telephone Encounter (Signed)
I thought so, have resent to [facility]. The interpretor had requested it to be sent to [place], it looks like she has to go in [facility]. Have sent to [facility], they will call her, thanks

## 2019-01-13 NOTE — Progress Notes (Signed)
Patient ID: Kristy Franklin, female   DOB: [birth year], 48 y.o.   MRN: [id number]  Chief Complaint  Patient presents with  . Shoulder Pain    Right shoulder pain. No injury    HPI Steven Placeres is a 48 y.o. female.   48 year old female presents with 5 out of 10 right shoulder pain for 2 months associated with CLICKING AND PAIN reaching behind her  PRIOR TREATMENT: Over-the-counter ibuprofen  TRAUMA NO    Review of Systems Review of Systems  All other systems reviewed and are negative.   Past Medical History:  Diagnosis Date  . Abdominal pain    Right mid only with bowel movements  . Blood in urine   . Change in bowel movement    more green color  . Essential hypertension, benign 11/12/2018  . Family history of prostate cancer   . GERD (gastroesophageal reflux disease)   . Headache    after menstrual cycle every month  . History of gallstones   . Hot flashes due to surgical menopause 12/17/2018  . Hypertension   . Neck pain on right side    pulsating  . Numbness and tingling of right leg   . Obesity   . Obesity (BMI 30.0-34.9) 12/17/2018  . Ovarian cancer, left ([place])   . PPD positive   . Skin rash    Chest, forehead, left arm    Past Surgical History:  Procedure Laterality Date  . CESAREAN SECTION     x2  . CHOLECYSTECTOMY     20 years ago  . COLONOSCOPY N/A 09/12/2018   Procedure: COLONOSCOPY;  Surgeon: Rogene Houston, MD;  [facility];  Service: Endoscopy;  Laterality: N/A;  200  . IR IMAGING GUIDED PORT INSERTION  10/24/2017  . OMENTECTOMY N/A 10/02/2017   Procedure: OMENTECTOMY;  Surgeon: Everitt Amber, MD;  [facility];  Service: Gynecology;  Laterality: N/A;  . OTHER SURGICAL HISTORY  07/2017   Left ovary removed  . ROBOTIC ASSISTED TOTAL HYSTERECTOMY WITH BILATERAL SALPINGO OOPHERECTOMY Right 10/02/2017   Procedure: XI ROBOTIC ASSISTED TOTAL HYSTERECTOMY WITH RIGHT SALPINGO OOPHORECTOMY;  Surgeon: Everitt Amber, MD;  [facility];  Service: Gynecology;  Laterality: Right;  . ROBOTIC PELVIC AND PARA-AORTIC LYMPH NODE DISSECTION Bilateral 10/02/2017   Procedure: XI ROBOTIC PELVIC LYMPH NODE DISSECTION;  Surgeon: Everitt Amber, MD;  [facility];  Service: Gynecology;  Laterality: Bilateral;    Family History  Problem Relation Age of Onset  . Hypertension Mother   . Prostate cancer Father 27       d. 27  . Diabetes Paternal Aunt   . Heart attack Paternal Aunt   . Heart attack Paternal Grandmother 95  . Thyroid disease Other 14     Social History   Tobacco Use  . Smoking status: Never Smoker  . Smokeless tobacco: Never Used  Substance Use Topics  . Alcohol use: Never    Frequency: Never  . Drug use: Never    Allergies  Allergen Reactions  . Other Rash    Pork    Allergies  Allergen Reactions  . Other Rash    Pork     No outpatient medications have been marked as taking for the 01/13/19 encounter (Office Visit) with Carole Civil, MD.    Physical Exam BP 116/73   Pulse 65   Temp 98.6 F (37 C)   Ht 5\' 3"  (1.6 m)   Wt 161 lb (73 kg)   LMP  09/24/2017   BMI 28.52 kg/m  Physical Exam Vitals signs and nursing note reviewed.  Constitutional:      Appearance: Normal appearance.  Neurological:     Mental Status: She is alert and oriented to person, place, and time.  Psychiatric:        Mood and Affect: Mood normal.    Ambulatory status normal with no assistive devices Right Shoulder Exam   Tenderness  Right shoulder tenderness location: Anterior joint line.  Range of Motion  Active abduction: normal  Passive abduction: normal  Extension: abnormal  External rotation: normal  Forward flexion: normal  Internal rotation 0 degrees: normal  Internal rotation 90 degrees: normal   Muscle Strength  The patient has normal right shoulder strength.  Tests  Apprehension: negative Cross arm: negative Impingement: positive Drop arm: negative Sulcus: absent  Other   Erythema: absent Sensation: normal Pulse: present   Left Shoulder Exam  Left shoulder exam is normal.  Tenderness  The patient is experiencing no tenderness.   Range of Motion  The patient has normal left shoulder ROM.  Muscle Strength  The patient has normal left shoulder strength.  Tests  Apprehension: negative  Other  Erythema: absent Sensation: normal Pulse: present       PROVOCATIVE TESTS DROP ARM TEST normal PAINFUL ARC terminal flexion EMPTY CAN -JOBST TEST NEGATIVE  EXTERNAL ROTATION LAG TEST negative LIFT OFF TEST normal BELLYPRESS TEST normal   MEDICAL DECISION SECTION  xrays ordered? YES  My independent reading of xrays: Normal x-ray type I acromion   Encounter Diagnoses  Name Primary?  . Right shoulder pain, unspecified chronicity   . Shoulder impingement, right Yes     PLAN:   No orders of the defined types were placed in this encounter.  Injection? YES   Procedure note the subacromial injection shoulder RIGHT    Verbal consent was obtained to inject the  RIGHT   Shoulder  Timeout was completed to confirm the injection site is a subacromial space of the  RIGHT  shoulder   Medication used Depo-Medrol 40 mg and lidocaine 1% 3 cc  Anesthesia was provided by ethyl chloride  The injection was performed in the RIGHT  posterior subacromial space. After pinning the skin with alcohol and anesthetized the skin with ethyl chloride the subacromial space was injected using a 20-gauge needle. There were no complications  Sterile dressing was applied.   MRI/CT/? NO  Recommend physical therapy  Follow-up as needed no surgery needed

## 2019-01-13 NOTE — Telephone Encounter (Signed)
FYI: Kristy Franklin from [facility] in [place] called saying that they are out of network for this patient. If they see her it would be selfpay. She spoke with the patient and patient said she would give our office a call and see if she could be referred somewhere else.

## 2019-01-13 NOTE — Patient Instructions (Signed)
Shoulder Impingement Syndrome  Shoulder impingement syndrome is a condition that causes pain when connective tissues (tendons) surrounding the shoulder joint become pinched. These tendons are part of the group of muscles and tissues that help to stabilize the shoulder (rotator cuff). Beneath the rotator cuff is a fluid-filled sac (bursa) that allows the muscles and tendons to glide smoothly. The bursa may become swollen or irritated (bursitis). Bursitis, swelling in the rotator cuff tendons, or both conditions can decrease how much space is under a bone in the shoulder joint (acromion), resulting in impingement. What are the causes? Shoulder impingement syndrome may be caused by bursitis or swelling of the rotator cuff tendons, which may result from:  Repetitive overhead arm movements.  Falling onto the shoulder.  Weakness in the shoulder muscles. What increases the risk? You may be more likely to develop this condition if you:  Play sports that involve throwing, such as baseball.  Participate in sports such as tennis, volleyball, and swimming.  Work as a painter, carpenter, or fruit picker. Some people are also more likely to develop impingement syndrome because of the shape of their acromion bone. What are the signs or symptoms? The main symptom of this condition is pain on the front or side of the shoulder. The pain may:  Get worse when lifting or raising the arm.  Get worse at night.  Wake you up from sleeping.  Feel sharp when the shoulder is moved and then fade to an ache. Other symptoms may include:  Tenderness.  Stiffness.  Inability to raise the arm above shoulder level or behind the body.  Weakness. How is this diagnosed? This condition may be diagnosed based on:  Your symptoms and medical history.  A physical exam.  Imaging tests, such as: ? X-rays. ? MRI. ? Ultrasound. How is this treated? This condition may be treated by:  Resting your shoulder and  avoiding all activities that cause pain or put stress on the shoulder.  Icing your shoulder.  NSAIDs to help reduce pain and swelling.  One or more injections of medicines to numb the area and reduce inflammation.  Physical therapy.  Surgery. This may be needed if nonsurgical treatments have not helped. Surgery may involve repairing the rotator cuff, reshaping the acromion, or removing the bursa. Follow these instructions at home: Managing pain, stiffness, and swelling   If directed, put ice on the injured area. ? Put ice in a plastic bag. ? Place a towel between your skin and the bag. ? Leave the ice on for 20 minutes, 2-3 times a day. Activity  Rest and return to your normal activities as told by your health care provider. Ask your health care provider what activities are safe for you.  Do exercises as told by your health care provider. General instructions  Do not use any products that contain nicotine or tobacco, such as cigarettes, e-cigarettes, and chewing tobacco. These can delay healing. If you need help quitting, ask your health care provider.  Ask your health care provider when it is safe for you to drive.  Take over-the-counter and prescription medicines only as told by your health care provider.  Keep all follow-up visits as told by your health care provider. This is important. How is this prevented?  Give your body time to rest between periods of activity.  Be safe and responsible while being active. This will help you avoid falls.  Maintain physical fitness, including strength and flexibility. Contact a health care provider if:  Your   symptoms have not improved after 1-2 months of treatment and rest.  You cannot lift your arm away from your body. Summary  Shoulder impingement syndrome is a condition that causes pain when connective tissues (tendons) surrounding the shoulder joint become pinched.  The main symptom of this condition is pain on the front or  side of the shoulder.  This condition is usually treated with rest, ice, and pain medicines as needed. This information is not intended to replace advice given to you by your health care provider. Make sure you discuss any questions you have with your health care provider. Document Released: 02/13/2005 Document Revised: 06/07/2018 Document Reviewed: 08/08/2017 Elsevier Patient Education  2020 [place].  Sndrome de pinzamiento del hombro Shoulder Impingement Syndrome  El sndrome de pinzamiento del hombro es una afeccin que causa dolor cuando se comprimen los tejidos conjuntivos (tendones) que rodean el hombro. Estos tendones son parte de un grupo de msculos y tejidos que ayudan a Research scientist (medical) hombro (manguito rotador). Por detrs del Clinical research associate, hay un saco lleno de lquido (bolsa) que permite que los msculos y los tendones se deslicen suavemente. La bolsa puede hincharse o inflamarse (bursitis). La bursitis o la hinchazn en los tendones del manguito rotador, o ambos trastornos, pueden disminuir el espacio que hay debajo del hueso en la articulacin del hombro (acromion), lo que causa pinzamiento. Cules son las causas? El sndrome de pinzamiento del hombro puede deberse a la bursitis o a la hinchazn en los tendones del manguito rotador, que se producen por lo siguiente:  Movimientos repetidos del brazo por encima del hombro.  Una cada sobre el hombro.  Debilidad de los msculos del hombro. Qu incrementa el riesgo? Tiene mayor probabilidad de sufrir esta afeccin si:  Practica deportes que requieren Personnel officer, como bisbol.  Practica deportes como tenis, vley y natacin.  Trabaja como pintor, carpintero o Dealer de frutas. Algunas personas tienen ms probabilidades de sufrir el sndrome de pinzamiento debido a la forma del hueso del acromion. Cules son los signos o los sntomas? El sntoma principal de esta afeccin es el dolor en la parte de  adelante o del costado del hombro. El dolor puede tener las siguientes caractersticas:  Empeora al levantar el brazo.  Empeora por la noche.  Le interrumpe el sueo.  Es ms agudo al [employer] hombro y luego se transforma en [place]. Otros sntomas pueden incluir:  Dolor a Secretary/administrator.  Entumecimiento.  Imposibilidad de levantar el brazo por arriba del nivel del hombro o detrs del cuerpo.  Debilidad. Cmo se diagnostica? Esta afeccin se puede diagnosticar en funcin de lo siguiente:  Los sntomas y los antecedentes mdicos.  Examen fsico.  Pruebas de diagnstico por imgenes, por ejemplo: ? Radiografas. ? Resonancia magntica (RM). ? Ecografa. Cmo se trata? El tratamiento para esta afeccin puede incluir lo siguiente:  Network engineer en reposo y Product/process development scientist todas las actividades que causen dolor o requieran un esfuerzo con el hombro.  Aplicar hielo en el hombro.  Tomar antiinflamatorios no esteroideos Dayna Ramus) para ayudar a Dietitian y la hinchazn.  Aplicarse una o ms inyecciones de medicamentos para anestesiar la zona y Risk manager.  Realizar fisioterapia.  Ciruga. Tal vez se necesite una ciruga si los tratamientos no quirrgicos no han dado resultado. La ciruga puede consistir en reparar el manguito rotador, modificar la forma del acromion o extraer la bolsa. Siga estas indicaciones en su casa: Control del dolor, el entumecimiento y la hinchazn  Si se lo indican, aplique hielo sobre la zona de la lesin. ? Ponga el hielo en una bolsa plstica. ? Coloque una [employer] piel y Therapist, nutritional. ? Coloque el hielo durante 61minutos, 2 a 3veces por da. Actividad  Descanse y retome sus actividades normales como se lo haya indicado el mdico. Pregntele al mdico qu actividades son seguras para usted.  Haga ejercicio como se lo haya indicado el mdico. Indicaciones generales  No consuma ningn producto que contenga nicotina o  tabaco, como cigarrillos, cigarrillos electrnicos y tabaco de Higher education careers adviser. Estos pueden retrasar la recuperacin. Si necesita ayuda para dejar de consumir, consulte al [employer].  Pregntele al mdico cundo es seguro volver a Forensic psychologist.  Tome los medicamentos de venta libre y los recetados solamente como se lo haya indicado el mdico.  Consulting civil engineer a todas las visitas de control como se lo haya indicado el mdico. Esto es importante. Cmo se evita?  Dele al cuerpo tiempo para [employer] perodos de [place] fsica.  Tome medidas de seguridad y sea responsable al hacer actividad fsica. Esto ayudar a evitar cadas.  Mantenga un buen estado fsico, lo que incluye la flexibilidad y Pharmacologist. Comunquese con un mdico si:  Los sntomas no mejoran despus de 1 a 63meses de tratamiento y reposo.  No puede levantar el brazo para alejarlo del cuerpo. Resumen  El sndrome de pinzamiento del hombro es una afeccin que causa dolor cuando se comprimen los tejidos conjuntivos (tendones) que rodean el hombro.  El sntoma principal de esta afeccin es el dolor en la parte de adelante o del costado del hombro.  Esta afeccin generalmente se trata con reposo, hielo y analgsicos segn sea necesario. Esta informacin no tiene Marine scientist el consejo del mdico. Asegrese de hacerle al mdico cualquier pregunta que tenga. Document Released: 11/30/2005 Document Revised: 09/28/2017 Document Reviewed: 09/28/2017 Elsevier Patient Education  2020 [employer].

## 2019-01-20 ENCOUNTER — Other Ambulatory Visit: Payer: Self-pay

## 2019-01-20 ENCOUNTER — Ambulatory Visit (HOSPITAL_COMMUNITY): Payer: PRIVATE HEALTH INSURANCE | Attending: Orthopedic Surgery | Admitting: Specialist

## 2019-01-20 ENCOUNTER — Encounter (HOSPITAL_COMMUNITY): Payer: Self-pay | Admitting: Specialist

## 2019-01-20 DIAGNOSIS — R29898 Other symptoms and signs involving the musculoskeletal system: Secondary | ICD-10-CM | POA: Diagnosis present

## 2019-01-20 DIAGNOSIS — M25611 Stiffness of right shoulder, not elsewhere classified: Secondary | ICD-10-CM | POA: Insufficient documentation

## 2019-01-20 DIAGNOSIS — M25511 Pain in right shoulder: Secondary | ICD-10-CM | POA: Diagnosis present

## 2019-01-20 NOTE — Patient Instructions (Signed)
Complete the following exercises 2-3 times a day.  Doorway Stretch  Place each hand opposite each other on the doorway. (You can change where you feel the stretch by moving arms higher or lower.) Step through with one foot and bend front knee until a stretch is felt and hold. Step through with the opposite foot on the next rep. Hold for __10-15___ seconds. Repeat __2__times.     Scapular Retraction (Standing)   With arms at sides, pinch shoulder blades together. Repeat __10__ times per set. Do __1__ sets per session. Do __2__ sessions per day.  http://orth.exer.us/944   Copyright  VHI. All rights reserved.   Internal Rotation Across Back  Grab the end of a towel with your affected side, palm facing backwards. Grab the towel with your unaffected side and pull your affected hand across your back until you feel a stretch in the front of your shoulder. If you feel pain, pull just to the pain, do not pull through the pain. Hold. Return your affected arm to your side. Try to keep your hand/arm close to your body during the entire movement.     Hold for 10-15 seconds. Complete 2 times.        Posterior Capsule Stretch   Stand or sit, one arm across body so hand rests over opposite shoulder. Gently push on crossed elbow with other hand until stretch is felt in shoulder of crossed arm. Hold _10-15__ seconds.  Repeat _2__ times per session. Do ___ sessions per day.   Wall Flexion  Slide your arm up the wall or door frame until a stretch is felt in your shoulder . Hold for 10-15 seconds. Complete 2 times     Shoulder Abduction Stretch  Stand side ways by a wall with affected up on wall. Gently step in toward wall to feel stretch. Hold for 10-15 seconds. Complete 2 times.   Scalene Stretch, Sitting  Repeat __10_ times per session. Do _1-2__ sessions per day.  Copyright  VHI. All rights reserved.  Scalene Stretch, Sitting   Sit, one hand tucked under hip on side to be  stretched, other hand over top of head. Gently pull head to side and backwards. Hold ___ seconds. For more stretch, lean body in direction of head pull. Repeat ___ times per session. Do ___ sessions per day.  Copyright  VHI. All rights reserved.  Side Bend, Standing   Stand, one hand grasping other arm above wrist behind body. Pull down while gently tilting head toward pulling side. Hold ___ seconds. Repeat ___ times per session. Do ___ sessions per day.  Copyright  VHI. All rights reserved.  Levator Scapula Stretch, Sitting   Sit, one hand on same-side shoulder blade, other hand on head. Gently pull head down and away. Hold ___ seconds. Repeat ___ times per session. Do ___ sessions per day.  Copyright  VHI. All rights reserved.  Lower Cervical / Upper Thoracic Stretch   Clasp hands together in front with arms extended. Gently pull shoulder blades apart and bend head forward. Hold ____ seconds. Repeat ____ times per set. Do ____ sets per session. Do ____ sessions per day.  http://orth.exer.us/354   Copyright  VHI. All rights reserved.  Axial Extension (Chin Tuck)   Pull chin in and lengthen back of neck. Hold ____ seconds while counting out loud. Repeat ____ times. Do ____ sessions per day.  http://gt2.exer.us/449   Copyright  VHI. All rights reserved.

## 2019-01-20 NOTE — Therapy (Signed)
[place]risty [place], [place], [address] Phone: [phone]   Fax:  [phone]  Occupational Therapy Evaluation  Patient Details  Name: Kristy Franklin MRN: [id number] Date of Birth: 04-Jun-1970 Referring Provider (OT): Dr. Arther Abbott   Encounter Date: 01/20/2019  OT End of Session - 01/20/19 1304    Visit Number  1    Number of Visits  8    Date for OT Re-Evaluation  02/19/19    Authorization Type  [employer]    Authorization Time Period  30 visits through 04/20/19    Authorization - Visit Number  1    Authorization - Number of Visits  30    OT Start Time  0947    OT Stop Time  1031    OT Time Calculation (min)  44 min    Activity Tolerance  Patient tolerated treatment well    Behavior During Therapy  [facility] for tasks assessed/performed       Past Medical History:  Diagnosis Date  . Abdominal pain    Right mid only with bowel movements  . Blood in urine   . Change in bowel movement    more green color  . Essential hypertension, benign 11/12/2018  . Family history of prostate cancer   . GERD (gastroesophageal reflux disease)   . Headache    after menstrual cycle every month  . History of gallstones   . Hot flashes due to surgical menopause 12/17/2018  . Hypertension   . Neck pain on right side    pulsating  . Numbness and tingling of right leg   . Obesity   . Obesity (BMI 30.0-34.9) 12/17/2018  . Ovarian cancer, left ([place])   . PPD positive   . Skin rash    Chest, forehead, left arm    Past Surgical History:  Procedure Laterality Date  . CESAREAN SECTION     x2  . CHOLECYSTECTOMY     20 years ago  . COLONOSCOPY N/A 09/12/2018   Procedure: COLONOSCOPY;  Surgeon: Rogene Houston, MD;  Location[facility];  Service: Endoscopy;  Laterality: N/A;  200  . IR IMAGING GUIDED PORT INSERTION  10/24/2017  . OMENTECTOMY N/A 10/02/2017   Procedure: OMENTECTOMY;  Surgeon: Everitt Amber, MD;  Location: [facility];   Service: Gynecology;  Laterality: N/A;  . OTHER SURGICAL HISTORY  07/2017   Left ovary removed  . ROBOTIC ASSISTED TOTAL HYSTERECTOMY WITH BILATERAL SALPINGO OOPHERECTOMY Right 10/02/2017   Procedure: XI ROBOTIC ASSISTED TOTAL HYSTERECTOMY WITH RIGHT SALPINGO OOPHORECTOMY;  Surgeon: Everitt Amber, MD;  Location: [facility];  Service: Gynecology;  Laterality: Right;  . ROBOTIC PELVIC AND PARA-AORTIC LYMPH NODE DISSECTION Bilateral 10/02/2017   Procedure: XI ROBOTIC PELVIC LYMPH NODE DISSECTION;  Surgeon: Everitt Amber, MD;  Location: [facility];  Service: Gynecology;  Laterality: Bilateral;    There were no vitals filed for this visit.  Subjective Assessment - 01/20/19 1254    Subjective   S:  I started having pain a couple of months ago.  I thought it was from my portacath.    Patient is accompanied by:  Interpreter    Pertinent History  Kristy Franklin reports experiencing increased pain and decreased mobiilty in her right shoulder approximately 2 months ago.  She attributed her pain to her portacath (placed in 2019), however the pain worsened.  She consulted iwth Dr. Aline Brochure and received a cortisone injection in her right shoulder.  She presents today for evaluation and  treatment.    Special Tests  FOTO:  65% independent    Patient Stated Goals  I want to get rid of the pain.    Currently in Pain?  Yes    Pain Score  4     Pain Location  Shoulder    Pain Orientation  Right;Posterior    Pain Descriptors / Indicators  Aching    Pain Type  Acute pain    Pain Onset  More than a month ago    Pain Frequency  Intermittent    Aggravating Factors   use, movement    Pain Relieving Factors  rest, heat    Effect of Pain on Daily Activities  min-mod        [facility] OT Assessment - 01/20/19 0001      Assessment   Medical Diagnosis  Right Shoulder Pain    Referring Provider (OT)  Dr. Arther Abbott    Onset Date/Surgical Date  --   2 months ago   Hand Dominance  Right    Next MD Visit  --   unknown    Prior Therapy  none      Precautions   Precautions  None      Restrictions   Weight Bearing Restrictions  No      Balance Screen   Has the patient fallen in the past 6 months  No    Has the patient had a decrease in activity level because of a fear of falling?   No    Is the patient reluctant to leave their home because of a fear of falling?   No      Home  Environment   Family/patient expects to be discharged to:  Private residence    Lives With  Spouse      Prior Function   Level of [place]  Works at home    Leisure  walking, cooking, attending church      ADL   ADL comments  difficulty reaching overhead, behind back, behind head, and out to the side.  clapping hands is also painful       Written Expression   Dominant Hand  Right      Vision - History   Baseline Vision  No visual deficits      Cognition   Overall Cognitive Status  Within Functional Limits for tasks assessed      Observation/Other Assessments   Focus on Therapeutic Outcomes (FOTO)   65% independent      Sensation   Light Touch  Appears Intact      ROM / Strength   AROM / PROM / Strength  AROM;PROM;Strength      Palpation   Palpation comment  moderate fascial restrictions in SCM, upper trapezius, and scapular region      AROM   Overall AROM Comments  in seated A/ROM is WFL      PROM   Overall PROM Comments  assessed in supine, external rotation and internal rotation iwth shoulder abducted    PROM Assessment Site  Shoulder    Right/Left Shoulder  Right    Right Shoulder Flexion  162 Degrees    Right Shoulder ABduction  175 Degrees    Right Shoulder Internal Rotation  90 Degrees    Right Shoulder External Rotation  82 Degrees      Strength   Overall Strength Comments  assessed in seated position    Strength Assessment Site  Shoulder  Right/Left Shoulder  Right    Right Shoulder Flexion  4+/5    Right Shoulder ABduction  4+/5    Right Shoulder Internal  Rotation  4+/5    Right Shoulder External Rotation  4+/5               OT Treatments/Exercises (OP) - 01/20/19 0001      Manual Therapy   Manual Therapy  Myofascial release    Manual therapy comments  manual therapy completed seperately from all other interventions this date of service    Myofascial Release  myofasical release and manual stretching to decrease pain and fascial restrictions and improve pain free mobility in right shoulder and associated areas.              OT Education - 01/20/19 1303    Education Details  shoulder and neck stretches    Person(s) Educated  Patient    Methods  Explanation;Demonstration    Comprehension  Verbalized understanding;Returned demonstration       OT Short Term Goals - 01/20/19 1320      OT SHORT TERM GOAL #1   Title  Patient will be educated and independent with HEP for right shoulder for increased use of right arm with functional tasks.    Time  4    Period  Weeks    Status  New    Target Date  02/19/19      OT SHORT TERM GOAL #2   Title  Patient will improve right shoulder A/ROM to WNL for improved independence with daily tasks such as combing hair and fastening bra.    Time  4    Period  Weeks    Status  New      OT SHORT TERM GOAL #3   Title  Patient will improve right shoulder strength to 5/5 in order to lift items into overhead and carry grocery bags.    Time  4    Period  Weeks    Status  New      OT SHORT TERM GOAL #4   Title  Patient will decrease pain in her right shoulder to 2/10 or better, in order to use right arm as dominant with daily tasks.    Time  4    Period  Weeks    Status  New      OT SHORT TERM GOAL #5   Title  Patient will decrease right shoulder fascial restrictions to trace for improved mobility required for use of right arm as dominant with daily tasks.    Time  4    Period  Weeks    Status  New               Plan - 01/20/19 1305    Clinical Impression Statement  A:  Patient is a 48 year old female with past medical history significant for ovarian cancer, portacath placement, htn.  Patient reports progressively worsening pain and limited movement in her dominant right shoulder for the past 2 months.  Pain and limited movement are causing decreased independence with reaching behind her back, behind her head, and into overhead cabinets.    OT Occupational Profile and History  Problem Focused Assessment - Including review of records relating to presenting problem    Occupational performance deficits (Please refer to evaluation for details):  ADL's;IADL's;Leisure    Body Structure / Function / Physical Skills  ADL;Decreased knowledge of use of DME;Strength;Pain;UE functional use;IADL;ROM;Fascial restriction;Flexibility;Muscle spasms    Rehab  Potential  Good    Clinical Decision Making  Limited treatment options, no task modification necessary    Comorbidities Affecting Occupational Performance:  May have comorbidities impacting occupational performance   portacath placement   Modification or Assistance to Complete Evaluation   No modification of tasks or assist necessary to complete eval    OT Frequency  2x / week    OT Duration  4 weeks    OT Treatment/Interventions  Self-care/ADL training;DME and/or AE instruction;Therapeutic activities;Therapeutic exercise;Ultrasound;Cryotherapy;Iontophoresis;Neuromuscular education;Passive range of motion;Electrical Stimulation;Energy conservation;Manual Therapy;Patient/family education    Plan  P:  Skilled OT intervention to decrease pain and restrictions and improve pain free range of motion in her right upper extremity in order to return to prior level of independence using her right arm as dominant.  Next session:  manual therapy, p/rom, aa/rom and a/rom, progressing as tolerated.    OT Home Exercise Plan  01/20/19:  shoulder stretches    Consulted and Agree with Plan of Care  Patient       Patient will benefit from skilled  therapeutic intervention in order to improve the following deficits and impairments:   Body Structure / Function / Physical Skills: ADL, Decreased knowledge of use of DME, Strength, Pain, UE functional use, IADL, ROM, Fascial restriction, Flexibility, Muscle spasms       Visit Diagnosis: Acute pain of right shoulder - Plan: Ot plan of care cert/re-cert  Other symptoms and signs involving the musculoskeletal system - Plan: Ot plan of care cert/re-cert  Stiffness of right shoulder, not elsewhere classified - Plan: Ot plan of care cert/re-cert    Problem List Patient Active Problem List   Diagnosis Date Noted  . Obesity (BMI 30.0-34.9) 12/17/2018  . Hot flashes due to surgical menopause 12/17/2018  . Essential hypertension, benign 11/12/2018  . Rectal pain 08/13/2018  . Dysuria 12/10/2017  . Steroid-induced hyperglycemia 11/20/2017  . 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 Arthralgia 11/20/2017  . Chemotherapy-induced nausea 11/20/2017  . Genetic testing 10/26/2017  . Elevated liver enzymes 10/25/2017  . Family history of prostate cancer   . Obesity 10/02/2017  . Urinary urgency 10/02/2017  . Ovarian cancer ([place]) 10/02/2017  . Ovarian cancer on left [facility]) 09/18/2017    Vangie Bicker, [place], OTR/L [phone]  01/20/2019, 1:42 PM  [facility] [address] [place], [place], [address] Phone: [phone]   Fax:  [phone]  Name: Kristy Franklin MRN: [id number] Date of Birth: 03/13/1970

## 2019-01-22 ENCOUNTER — Ambulatory Visit (HOSPITAL_COMMUNITY): Payer: PRIVATE HEALTH INSURANCE | Admitting: Occupational Therapy

## 2019-01-22 ENCOUNTER — Encounter (HOSPITAL_COMMUNITY): Payer: Self-pay | Admitting: Occupational Therapy

## 2019-01-22 ENCOUNTER — Other Ambulatory Visit: Payer: Self-pay

## 2019-01-22 DIAGNOSIS — R29898 Other symptoms and signs involving the musculoskeletal system: Secondary | ICD-10-CM

## 2019-01-22 DIAGNOSIS — M25511 Pain in right shoulder: Secondary | ICD-10-CM | POA: Diagnosis not present

## 2019-01-22 DIAGNOSIS — M25611 Stiffness of right shoulder, not elsewhere classified: Secondary | ICD-10-CM

## 2019-01-22 NOTE — Therapy (Signed)
[place], [place], [address] Phone: [phone]   Fax:  [phone]  Occupational Therapy Treatment  Patient Details  Name: Kristy Franklin MRN: [id number] Date of Birth: November 17, 1970 Referring Provider (OT): Dr. Arther Abbott   Encounter Date: 01/22/2019  OT End of Session - 01/22/19 1734    Visit Number  2    Number of Visits  8    Date for OT Re-Evaluation  02/19/19    Authorization Type  [employer]    Authorization Time Period  30 visits through 04/20/19    Authorization - Visit Number  2    Authorization - Number of Visits  4    OT Start Time  [id number]   pt arrived late   OT Stop Time  1730    OT Time Calculation (min)  37 min    Activity Tolerance  Patient tolerated treatment well    Behavior During Therapy  [facility] for tasks assessed/performed       Past Medical History:  Diagnosis Date  . Abdominal pain    Right mid only with bowel movements  . Blood in urine   . Change in bowel movement    more green color  . Essential hypertension, benign 11/12/2018  . Family history of prostate cancer   . GERD (gastroesophageal reflux disease)   . Headache    after menstrual cycle every month  . History of gallstones   . Hot flashes due to surgical menopause 12/17/2018  . Hypertension   . Neck pain on right side    pulsating  . Numbness and tingling of right leg   . Obesity   . Obesity (BMI 30.0-34.9) 12/17/2018  . Ovarian cancer, left ([place])   . PPD positive   . Skin rash    Chest, forehead, left arm    Past Surgical History:  Procedure Laterality Date  . CESAREAN SECTION     x2  . CHOLECYSTECTOMY     20 years ago  . COLONOSCOPY N/A 09/12/2018   Procedure: COLONOSCOPY;  Surgeon: Rogene Houston, MD;  [facility];  Service: Endoscopy;  Laterality: N/A;  200  . IR IMAGING GUIDED PORT INSERTION  10/24/2017  . OMENTECTOMY N/A 10/02/2017   Procedure: OMENTECTOMY;  Surgeon: Everitt Amber, MD;   [facility];  Service: Gynecology;  Laterality: N/A;  . OTHER SURGICAL HISTORY  07/2017   Left ovary removed  . ROBOTIC ASSISTED TOTAL HYSTERECTOMY WITH BILATERAL SALPINGO OOPHERECTOMY Right 10/02/2017   Procedure: XI ROBOTIC ASSISTED TOTAL HYSTERECTOMY WITH RIGHT SALPINGO OOPHORECTOMY;  Surgeon: Everitt Amber, MD;  [facility];  Service: Gynecology;  Laterality: Right;  . ROBOTIC PELVIC AND PARA-AORTIC LYMPH NODE DISSECTION Bilateral 10/02/2017   Procedure: XI ROBOTIC PELVIC LYMPH NODE DISSECTION;  Surgeon: Everitt Amber, MD;  [facility];  Service: Gynecology;  Laterality: Bilateral;    There were no vitals filed for this visit.  Subjective Assessment - 01/22/19 1653    Subjective   S: It's been hurting some yesterday but I think it's because of the cold.    Patient is accompanied by:  Interpreter   Willette Pa with [place]   Currently in Pain?  No/denies         [facility] OT Assessment - 01/22/19 1653      Assessment   Medical Diagnosis  Right Shoulder Pain      Precautions   Precautions  None  OT Treatments/Exercises (OP) - 01/22/19 1656      Exercises   Exercises  Shoulder      Shoulder Exercises: Supine   Protraction  PROM;5 reps;AROM;10 reps    Horizontal ABduction  PROM;5 reps;AROM;10 reps    External Rotation  PROM;5 reps;AROM;10 reps    Internal Rotation  PROM;5 reps;AROM;10 reps    Flexion  PROM;5 reps;AROM;10 reps    ABduction  PROM;5 reps;AROM;10 reps      Shoulder Exercises: Standing   Protraction  AROM;10 reps    Horizontal ABduction  AROM;10 reps    External Rotation  AROM;10 reps    Internal Rotation  AROM;10 reps    Flexion  AROM;10 reps    ABduction  AROM;10 reps    Extension  Theraband;10 reps    Theraband Level (Shoulder Extension)  Level 2 (Red)    Row  Theraband;10 reps    Theraband Level (Shoulder Row)  Level 2 (Red)    Retraction  Theraband;10 reps    Theraband Level (Shoulder Retraction)  Level 2 (Red)       Shoulder Exercises: ROM/Strengthening   UBE (Upper Arm Bike)  Level 1 3' forward 3' reverse    Proximal Shoulder Strengthening, Supine  10X each no rest breaks    Proximal Shoulder Strengthening, Seated  10X each no rest breaks      Manual Therapy   Manual Therapy  Myofascial release    Manual therapy comments  manual therapy completed seperately from all other interventions this date of service    Myofascial Release  myofasical release and manual stretching to decrease pain and fascial restrictions and improve pain free mobility in right shoulder and associated areas.                 OT Short Term Goals - 01/22/19 1708      OT SHORT TERM GOAL #1   Title  Patient will be educated and independent with HEP for right shoulder for increased use of right arm with functional tasks.    Time  4    Period  Weeks    Status  On-going    Target Date  02/19/19      OT SHORT TERM GOAL #2   Title  Patient will improve right shoulder A/ROM to WNL for improved independence with daily tasks such as combing hair and fastening bra.    Time  4    Period  Weeks    Status  On-going      OT SHORT TERM GOAL #3   Title  Patient will improve right shoulder strength to 5/5 in order to lift items into overhead and carry grocery bags.    Time  4    Period  Weeks    Status  On-going      OT SHORT TERM GOAL #4   Title  Patient will decrease pain in her right shoulder to 2/10 or better, in order to use right arm as dominant with daily tasks.    Time  4    Period  Weeks    Status  On-going      OT SHORT TERM GOAL #5   Title  Patient will decrease right shoulder fascial restrictions to trace for improved mobility required for use of right arm as dominant with daily tasks.    Time  4    Period  Weeks    Status  On-going  Plan - 01/22/19 1734    Clinical Impression Statement  A: Initiated myofascial release and manual techniques to right anterior shoulder, trapezius, and  scapular regions. Pt with good mobility, achieving full ROM with both passive stretching and A/ROM. Also introduced scapular theraband and UBE today, min discomfort with theraband tasks. Verbal cuing for form and technique, tactile and visual cuing for retraction with theraband.    Body Structure / Function / Physical Skills  ADL;Decreased knowledge of use of DME;Strength;Pain;UE functional use;IADL;ROM;Fascial restriction;Flexibility;Muscle spasms    Plan  P: Continue with A/ROM and update HEP. Add x to v arms and ball on wall       Patient will benefit from skilled therapeutic intervention in order to improve the following deficits and impairments:   Body Structure / Function / Physical Skills: ADL, Decreased knowledge of use of DME, Strength, Pain, UE functional use, IADL, ROM, Fascial restriction, Flexibility, Muscle spasms       Visit Diagnosis: Acute pain of right shoulder  Other symptoms and signs involving the musculoskeletal system  Stiffness of right shoulder, not elsewhere classified    Problem List Patient Active Problem List   Diagnosis Date Noted  . Obesity (BMI 30.0-34.9) 12/17/2018  . Hot flashes due to surgical menopause 12/17/2018  . Essential hypertension, benign 11/12/2018  . Rectal pain 08/13/2018  . Dysuria 12/10/2017  . Steroid-induced hyperglycemia 11/20/2017  . Peripheral neuropathy due to chemotherapy ([place]) 11/20/2017  . Arthralgia 11/20/2017  . Chemotherapy-induced nausea 11/20/2017  . Genetic testing 10/26/2017  . Elevated liver enzymes 10/25/2017  . Family history of prostate cancer   . Obesity 10/02/2017  . Urinary urgency 10/02/2017  . Ovarian cancer ([place]) 10/02/2017  . Ovarian cancer on left [facility]) 09/18/2017   Guadelupe Sabin, OTR/L  [phone] 01/22/2019, 5:36 PM  [place] [address] [place], [place], [address] Phone: [phone]   Fax:  [phone]  Name: Kristy Franklin MRN:  [id number] Date of Birth: 1970-11-27

## 2019-01-30 ENCOUNTER — Encounter (HOSPITAL_COMMUNITY): Payer: Self-pay | Admitting: Occupational Therapy

## 2019-01-30 ENCOUNTER — Ambulatory Visit (HOSPITAL_COMMUNITY): Payer: PRIVATE HEALTH INSURANCE | Admitting: Occupational Therapy

## 2019-01-30 ENCOUNTER — Other Ambulatory Visit: Payer: Self-pay

## 2019-01-30 DIAGNOSIS — R1032 Left lower quadrant pain: Secondary | ICD-10-CM | POA: Diagnosis not present

## 2019-01-30 DIAGNOSIS — Z8249 Family history of ischemic heart disease and other diseases of the circulatory system: Secondary | ICD-10-CM | POA: Diagnosis not present

## 2019-01-30 DIAGNOSIS — C541 Malignant neoplasm of endometrium: Secondary | ICD-10-CM | POA: Diagnosis not present

## 2019-01-30 DIAGNOSIS — C562 Malignant neoplasm of left ovary: Secondary | ICD-10-CM | POA: Diagnosis present

## 2019-01-30 DIAGNOSIS — R29898 Other symptoms and signs involving the musculoskeletal system: Secondary | ICD-10-CM | POA: Insufficient documentation

## 2019-01-30 DIAGNOSIS — E669 Obesity, unspecified: Secondary | ICD-10-CM | POA: Diagnosis not present

## 2019-01-30 DIAGNOSIS — R6881 Early satiety: Secondary | ICD-10-CM | POA: Diagnosis not present

## 2019-01-30 DIAGNOSIS — M25511 Pain in right shoulder: Secondary | ICD-10-CM

## 2019-01-30 DIAGNOSIS — Z8042 Family history of malignant neoplasm of prostate: Secondary | ICD-10-CM | POA: Diagnosis not present

## 2019-01-30 DIAGNOSIS — M25611 Stiffness of right shoulder, not elsewhere classified: Secondary | ICD-10-CM | POA: Insufficient documentation

## 2019-01-30 DIAGNOSIS — Z90721 Acquired absence of ovaries, unilateral: Secondary | ICD-10-CM | POA: Diagnosis not present

## 2019-01-30 DIAGNOSIS — I898 Other specified noninfective disorders of lymphatic vessels and lymph nodes: Secondary | ICD-10-CM | POA: Diagnosis not present

## 2019-01-30 DIAGNOSIS — Z90710 Acquired absence of both cervix and uterus: Secondary | ICD-10-CM | POA: Diagnosis not present

## 2019-01-30 DIAGNOSIS — Z833 Family history of diabetes mellitus: Secondary | ICD-10-CM | POA: Diagnosis not present

## 2019-01-30 DIAGNOSIS — Z79899 Other long term (current) drug therapy: Secondary | ICD-10-CM | POA: Diagnosis not present

## 2019-01-30 DIAGNOSIS — I10 Essential (primary) hypertension: Secondary | ICD-10-CM | POA: Diagnosis not present

## 2019-01-30 DIAGNOSIS — K30 Functional dyspepsia: Secondary | ICD-10-CM | POA: Diagnosis not present

## 2019-01-30 DIAGNOSIS — Z8349 Family history of other endocrine, nutritional and metabolic diseases: Secondary | ICD-10-CM | POA: Diagnosis not present

## 2019-01-30 NOTE — Patient Instructions (Signed)

## 2019-01-30 NOTE — Therapy (Signed)
[place], [place], [address] Phone: [phone]   Fax:  [phone]  Occupational Therapy Treatment  Patient Details  Name: Kristy Franklin MRN: [id number] Date of Birth: 05-14-1970 Referring Provider (OT): Dr. Arther Abbott   Encounter Date: 01/30/2019  OT End of Session - 01/30/19 1152    Visit Number  3    Number of Visits  8    Date for OT Re-Evaluation  02/19/19    Authorization Type  [employer]    Authorization Time Period  30 visits through 04/20/19    Authorization - Visit Number  3    Authorization - Number of Visits  30    OT Start Time  0905    OT Stop Time  0945    OT Time Calculation (min)  40 min    Activity Tolerance  Patient tolerated treatment well    Behavior During Therapy  [facility] for tasks assessed/performed       Past Medical History:  Diagnosis Date  . Abdominal pain    Right mid only with bowel movements  . Blood in urine   . Change in bowel movement    more green color  . Essential hypertension, benign 11/12/2018  . Family history of prostate cancer   . GERD (gastroesophageal reflux disease)   . Headache    after menstrual cycle every month  . History of gallstones   . Hot flashes due to surgical menopause 12/17/2018  . Hypertension   . Neck pain on right side    pulsating  . Numbness and tingling of right leg   . Obesity   . Obesity (BMI 30.0-34.9) 12/17/2018  . Ovarian cancer, left ([place])   . PPD positive   . Skin rash    Chest, forehead, left arm    Past Surgical History:  Procedure Laterality Date  . CESAREAN SECTION     x2  . CHOLECYSTECTOMY     20 years ago  . COLONOSCOPY N/A 09/12/2018   Procedure: COLONOSCOPY;  Surgeon: Rogene Houston, MD;  [facility];  Service: Endoscopy;  Laterality: N/A;  200  . IR IMAGING GUIDED PORT INSERTION  10/24/2017  . OMENTECTOMY N/A 10/02/2017   Procedure: OMENTECTOMY;  Surgeon: Everitt Amber, MD;  [facility];   Service: Gynecology;  Laterality: N/A;  . OTHER SURGICAL HISTORY  07/2017   Left ovary removed  . ROBOTIC ASSISTED TOTAL HYSTERECTOMY WITH BILATERAL SALPINGO OOPHERECTOMY Right 10/02/2017   Procedure: XI ROBOTIC ASSISTED TOTAL HYSTERECTOMY WITH RIGHT SALPINGO OOPHORECTOMY;  Surgeon: Everitt Amber, MD;  [facility];  Service: Gynecology;  Laterality: Right;  . ROBOTIC PELVIC AND PARA-AORTIC LYMPH NODE DISSECTION Bilateral 10/02/2017   Procedure: XI ROBOTIC PELVIC LYMPH NODE DISSECTION;  Surgeon: Everitt Amber, MD;  [facility];  Service: Gynecology;  Laterality: Bilateral;    There were no vitals filed for this visit.  Subjective Assessment - 01/30/19 0906    Subjective   S: Yesterday I had a sharp pain.    Currently in Pain?  No/denies         [facility] OT Assessment - 01/30/19 0906      Assessment   Medical Diagnosis  Right Shoulder Pain      Precautions   Precautions  None               OT Treatments/Exercises (OP) - 01/30/19 0910      Exercises   Exercises  Shoulder  Shoulder Exercises: Supine   Protraction  PROM;5 reps;AROM;12 reps    Horizontal ABduction  PROM;5 reps;AROM;12 reps    External Rotation  PROM;5 reps;AROM;12 reps    Internal Rotation  PROM;5 reps;AROM;12 reps    Flexion  PROM;5 reps;AROM;12 reps    ABduction  PROM;5 reps;AROM;12 reps      Shoulder Exercises: Standing   Protraction  AROM;12 reps    Horizontal ABduction  AROM;12 reps    External Rotation  AROM;12 reps    Internal Rotation  AROM;12 reps    Flexion  AROM;12 reps    ABduction  AROM;12 reps    Extension  Theraband;10 reps    Theraband Level (Shoulder Extension)  Level 3 (Green)    Row  [employer] reps    Theraband Level (Shoulder Row)  Level 3 (Green)    Retraction  Theraband;10 reps    Theraband Level (Shoulder Retraction)  Level 3 (Green)      Shoulder Exercises: ROM/Strengthening   UBE (Upper Arm Bike)  Level 1 2' reverse    X to V Arms  12X     Proximal Shoulder  Strengthening, Supine  10X each no rest breaks    Proximal Shoulder Strengthening, Seated  10X each no rest breaks    Ball on Wall  1' flexion 1' abduction      Manual Therapy   Manual Therapy  Myofascial release    Manual therapy comments  manual therapy completed seperately from all other interventions this date of service    Myofascial Release  myofasical release and manual stretching to decrease pain and fascial restrictions and improve pain free mobility in right shoulder and associated areas.               OT Education - 01/30/19 0929    Education Details  shoulder A/ROM    Person(s) Educated  Patient    Methods  Explanation;Demonstration;Handout    Comprehension  Verbalized understanding;Returned demonstration       OT Short Term Goals - 01/22/19 1708      OT SHORT TERM GOAL #1   Title  Patient will be educated and independent with HEP for right shoulder for increased use of right arm with functional tasks.    Time  4    Period  Weeks    Status  On-going    Target Date  02/19/19      OT SHORT TERM GOAL #2   Title  Patient will improve right shoulder A/ROM to WNL for improved independence with daily tasks such as combing hair and fastening bra.    Time  4    Period  Weeks    Status  On-going      OT SHORT TERM GOAL #3   Title  Patient will improve right shoulder strength to 5/5 in order to lift items into overhead and carry grocery bags.    Time  4    Period  Weeks    Status  On-going      OT SHORT TERM GOAL #4   Title  Patient will decrease pain in her right shoulder to 2/10 or better, in order to use right arm as dominant with daily tasks.    Time  4    Period  Weeks    Status  On-going      OT SHORT TERM GOAL #5   Title  Patient will decrease right shoulder fascial restrictions to trace for improved mobility required for use of right arm as dominant  with daily tasks.    Time  4    Period  Weeks    Status  On-going               Plan -  01/30/19 1152    Clinical Impression Statement  A: Continued with manual therapy to right upper arm and trapezius regions to address fascial restrictions. Pt reports pain only with end range flexion today. Continued with A/ROM and scapular theraband increasing to green band. Added x to v arms and ball on wall for scapular stabililty. Verbal cuing for form and technique, visual and tactile cues for retraction with band.    Body Structure / Function / Physical Skills  ADL;Decreased knowledge of use of DME;Strength;Pain;UE functional use;IADL;ROM;Fascial restriction;Flexibility;Muscle spasms    Plan  P: Add 1# weight in supine, add y lift off       Patient will benefit from skilled therapeutic intervention in order to improve the following deficits and impairments:   Body Structure / Function / Physical Skills: ADL, Decreased knowledge of use of DME, Strength, Pain, UE functional use, IADL, ROM, Fascial restriction, Flexibility, Muscle spasms       Visit Diagnosis: Acute pain of right shoulder  Other symptoms and signs involving the musculoskeletal system  Stiffness of right shoulder, not elsewhere classified    Problem List Patient Active Problem List   Diagnosis Date Noted  . Obesity (BMI 30.0-34.9) 12/17/2018  . Hot flashes due to surgical menopause 12/17/2018  . Essential hypertension, benign 11/12/2018  . Rectal pain 08/13/2018  . Dysuria 12/10/2017  . Steroid-induced hyperglycemia 11/20/2017  . 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 Arthralgia 11/20/2017  . Chemotherapy-induced nausea 11/20/2017  . Genetic testing 10/26/2017  . Elevated liver enzymes 10/25/2017  . Family history of prostate cancer   . Obesity 10/02/2017  . Urinary urgency 10/02/2017  . Ovarian cancer ([place]) 10/02/2017  . Ovarian cancer on left [facility]) 09/18/2017   Guadelupe Sabin, OTR/L  [phone] 01/30/2019, 11:56 AM  [place], [place], [address] Phone: [phone]   Fax:  [phone]  Name: Kristy Franklin MRN: [id number] Date of Birth: 12-15-1970

## 2019-01-31 ENCOUNTER — Ambulatory Visit (HOSPITAL_COMMUNITY): Payer: PRIVATE HEALTH INSURANCE

## 2019-01-31 ENCOUNTER — Encounter (HOSPITAL_COMMUNITY): Payer: Self-pay

## 2019-01-31 DIAGNOSIS — R29898 Other symptoms and signs involving the musculoskeletal system: Secondary | ICD-10-CM

## 2019-01-31 DIAGNOSIS — M25611 Stiffness of right shoulder, not elsewhere classified: Secondary | ICD-10-CM

## 2019-01-31 DIAGNOSIS — C562 Malignant neoplasm of left ovary: Secondary | ICD-10-CM | POA: Diagnosis not present

## 2019-01-31 DIAGNOSIS — M25511 Pain in right shoulder: Secondary | ICD-10-CM

## 2019-01-31 NOTE — Therapy (Signed)
[place], [place], [address] Phone: [phone]   Fax:  [phone]  Occupational Therapy Treatment  Patient Details  Name: Kristy Franklin MRN: [id number] Date of Birth: 06-Feb-1971 Referring Provider (OT): Dr. Arther Abbott   Encounter Date: 01/31/2019  OT End of Session - 01/31/19 0841    Visit Number  4    Number of Visits  8    Date for OT Re-Evaluation  02/19/19    Authorization Type  [employer]    Authorization Time Period  30 visits through 04/20/19    Authorization - Visit Number  4    Authorization - Number of Visits  30    OT Start Time  0817    OT Stop Time  0855    OT Time Calculation (min)  38 min    Activity Tolerance  Patient tolerated treatment well    Behavior During Therapy  [facility] for tasks assessed/performed       Past Medical History:  Diagnosis Date  . Abdominal pain    Right mid only with bowel movements  . Blood in urine   . Change in bowel movement    more green color  . Essential hypertension, benign 11/12/2018  . Family history of prostate cancer   . GERD (gastroesophageal reflux disease)   . Headache    after menstrual cycle every month  . History of gallstones   . Hot flashes due to surgical menopause 12/17/2018  . Hypertension   . Neck pain on right side    pulsating  . Numbness and tingling of right leg   . Obesity   . Obesity (BMI 30.0-34.9) 12/17/2018  . Ovarian cancer, left ([place])   . PPD positive   . Skin rash    Chest, forehead, left arm    Past Surgical History:  Procedure Laterality Date  . CESAREAN SECTION     x2  . CHOLECYSTECTOMY     20 years ago  . COLONOSCOPY N/A 09/12/2018   Procedure: COLONOSCOPY;  Surgeon: Rogene Houston, MD;  [facility];  Service: Endoscopy;  Laterality: N/A;  200  . IR IMAGING GUIDED PORT INSERTION  10/24/2017  . OMENTECTOMY N/A 10/02/2017   Procedure: OMENTECTOMY;  Surgeon: Everitt Amber, MD;  [facility];   Service: Gynecology;  Laterality: N/A;  . OTHER SURGICAL HISTORY  07/2017   Left ovary removed  . ROBOTIC ASSISTED TOTAL HYSTERECTOMY WITH BILATERAL SALPINGO OOPHERECTOMY Right 10/02/2017   Procedure: XI ROBOTIC ASSISTED TOTAL HYSTERECTOMY WITH RIGHT SALPINGO OOPHORECTOMY;  Surgeon: Everitt Amber, MD;  [facility];  Service: Gynecology;  Laterality: Right;  . ROBOTIC PELVIC AND PARA-AORTIC LYMPH NODE DISSECTION Bilateral 10/02/2017   Procedure: XI ROBOTIC PELVIC LYMPH NODE DISSECTION;  Surgeon: Everitt Amber, MD;  [facility];  Service: Gynecology;  Laterality: Bilateral;    There were no vitals filed for this visit.  Subjective Assessment - 01/31/19 0833    Subjective   S: I wasn't sore from yesterday and I thought I would be.    Patient is accompanied by:  [place].   Currently in Pain?  Yes    Pain Score  4     Pain Location  Shoulder    Pain Orientation  Right;Posterior;Anterior    Pain Descriptors / Indicators  Aching    Pain Type  Acute pain    Pain Radiating Towards  neck to front and back of  shoulder    Pain Onset  More than a month ago    Aggravating Factors   use, movement    Pain Relieving Factors  rest, heat    Effect of Pain on Daily Activities  min effect    Multiple Pain Sites  No         [facility] OT Assessment - 01/31/19 0835      Assessment   Medical Diagnosis  Right Shoulder Pain      Precautions   Precautions  None               OT Treatments/Exercises (OP) - 01/31/19 0835      Exercises   Exercises  Shoulder      Shoulder Exercises: Supine   Protraction  PROM;5 reps;Strengthening;15 reps    Protraction Weight (lbs)  1    Horizontal ABduction  PROM;5 reps;Strengthening;15 reps    Horizontal ABduction Weight (lbs)  1    External Rotation  PROM;5 reps;Strengthening;15 reps    External Rotation Weight (lbs)  1    Internal Rotation  PROM;5 reps;Strengthening;15 reps    Internal Rotation Weight  (lbs)  1    Flexion  PROM;5 reps;Strengthening;15 reps    Shoulder Flexion Weight (lbs)  1    ABduction  PROM;5 reps;Strengthening;15 reps    Shoulder ABduction Weight (lbs)  1      Shoulder Exercises: Standing   Protraction  Strengthening;15 reps    Protraction Weight (lbs)  1    Horizontal ABduction  Strengthening;15 reps    Horizontal ABduction Weight (lbs)  1    External Rotation  Strengthening;15 reps    External Rotation Weight (lbs)  1    Internal Rotation  Strengthening;15 reps    Internal Rotation Weight (lbs)  1    Flexion  Strengthening;15 reps    Shoulder Flexion Weight (lbs)  1    ABduction  Strengthening;15 reps    Shoulder ABduction Weight (lbs)  1      Shoulder Exercises: ROM/Strengthening   UBE (Upper Arm Bike)  Level 1 2' reverse 2' forward   pace: 4.0-5.0   X to V Arms  12X     Proximal Shoulder Strengthening, Supine  15X with 1# no rest breaks    Proximal Shoulder Strengthening, Seated  15X with 1# no rest breaks      Manual Therapy   Manual Therapy  Myofascial release    Manual therapy comments  manual therapy completed seperately from all other interventions this date of service    Myofascial Release  myofasical release and manual stretching to decrease pain and fascial restrictions and improve pain free mobility in right shoulder and associated areas.                 OT Short Term Goals - 01/22/19 1708      OT SHORT TERM GOAL #1   Title  Patient will be educated and independent with HEP for right shoulder for increased use of right arm with functional tasks.    Time  4    Period  Weeks    Status  On-going    Target Date  02/19/19      OT SHORT TERM GOAL #2   Title  Patient will improve right shoulder A/ROM to WNL for improved independence with daily tasks such as combing hair and fastening bra.    Time  4    Period  Weeks    Status  On-going  OT SHORT TERM GOAL #3   Title  Patient will improve right shoulder strength to 5/5 in order  to lift items into overhead and carry grocery bags.    Time  4    Period  Weeks    Status  On-going      OT SHORT TERM GOAL #4   Title  Patient will decrease pain in her right shoulder to 2/10 or better, in order to use right arm as dominant with daily tasks.    Time  4    Period  Weeks    Status  On-going      OT SHORT TERM GOAL #5   Title  Patient will decrease right shoulder fascial restrictions to trace for improved mobility required for use of right arm as dominant with daily tasks.    Time  4    Period  Weeks    Status  On-going               Plan - 01/31/19 [id number]    Clinical Impression Statement  A: Patient did well with 1# strengthening and completed supine and standing. VC for form and technique. Manual techniques completed to address fascial restrictions in the upper trapezius region. Pt displayed full passive and active ROM. Mild muscle fatigue noted during standing strengthening.    Body Structure / Function / Physical Skills  ADL;Decreased knowledge of use of DME;Strength;Pain;UE functional use;IADL;ROM;Fascial restriction;Flexibility;Muscle spasms    Plan  P: Add therapy ball strengthening standing.    Consulted and Agree with Plan of Care  Patient       Patient will benefit from skilled therapeutic intervention in order to improve the following deficits and impairments:   Body Structure / Function / Physical Skills: ADL, Decreased knowledge of use of DME, Strength, Pain, UE functional use, IADL, ROM, Fascial restriction, Flexibility, Muscle spasms       Visit Diagnosis: Stiffness of right shoulder, not elsewhere classified  Other symptoms and signs involving the musculoskeletal system  Acute pain of right shoulder    Problem List Patient Active Problem List   Diagnosis Date Noted  . Obesity (BMI 30.0-34.9) 12/17/2018  . Hot flashes due to surgical menopause 12/17/2018  . Essential hypertension, benign 11/12/2018  . Rectal pain 08/13/2018  .  Dysuria 12/10/2017  . Steroid-induced hyperglycemia 11/20/2017  . 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 Arthralgia 11/20/2017  . Chemotherapy-induced nausea 11/20/2017  . Genetic testing 10/26/2017  . Elevated liver enzymes 10/25/2017  . Family history of prostate cancer   . Obesity 10/02/2017  . Urinary urgency 10/02/2017  . Ovarian cancer ([place]) 10/02/2017  . Ovarian cancer on left [facility]) 09/18/2017   Ailene Ravel, OTR/L,CBIS  [phone]  01/31/2019, 9:29 AM  [place] [address] [place], [place], [address] Phone: [phone]   Fax:  [phone]  Name: Kristy Franklin MRN: [id number] Date of Birth: September 15, 1970

## 2019-02-05 ENCOUNTER — Ambulatory Visit (HOSPITAL_COMMUNITY): Payer: PRIVATE HEALTH INSURANCE | Admitting: Occupational Therapy

## 2019-02-05 ENCOUNTER — Other Ambulatory Visit: Payer: Self-pay

## 2019-02-05 ENCOUNTER — Encounter (HOSPITAL_COMMUNITY): Payer: Self-pay | Admitting: Occupational Therapy

## 2019-02-05 DIAGNOSIS — M25511 Pain in right shoulder: Secondary | ICD-10-CM

## 2019-02-05 DIAGNOSIS — M25611 Stiffness of right shoulder, not elsewhere classified: Secondary | ICD-10-CM

## 2019-02-05 DIAGNOSIS — R29898 Other symptoms and signs involving the musculoskeletal system: Secondary | ICD-10-CM

## 2019-02-05 DIAGNOSIS — C562 Malignant neoplasm of left ovary: Secondary | ICD-10-CM | POA: Diagnosis not present

## 2019-02-05 NOTE — Therapy (Signed)
[place], [place], [address] Phone: [phone]   Fax:  [phone]  Occupational Therapy Treatment  Patient Details  Name: Kristy Franklin MRN: [id number] Date of Birth: 1970-03-10 Referring Provider (OT): Dr. Arther Abbott   Encounter Date: 02/05/2019  OT End of Session - 02/05/19 1028    Visit Number  5    Number of Visits  8    Date for OT Re-Evaluation  02/19/19    Authorization Type  [employer]    Authorization Time Period  30 visits through 04/20/19    Authorization - Visit Number  5    Authorization - Number of Visits  30    OT Start Time  0946    OT Stop Time  1026    OT Time Calculation (min)  40 min    Activity Tolerance  Patient tolerated treatment well    Behavior During Therapy  [facility] for tasks assessed/performed       Past Medical History:  Diagnosis Date  . Abdominal pain    Right mid only with bowel movements  . Blood in urine   . Change in bowel movement    more green color  . Essential hypertension, benign 11/12/2018  . Family history of prostate cancer   . GERD (gastroesophageal reflux disease)   . Headache    after menstrual cycle every month  . History of gallstones   . Hot flashes due to surgical menopause 12/17/2018  . Hypertension   . Neck pain on right side    pulsating  . Numbness and tingling of right leg   . Obesity   . Obesity (BMI 30.0-34.9) 12/17/2018  . Ovarian cancer, left ([place])   . PPD positive   . Skin rash    Chest, forehead, left arm    Past Surgical History:  Procedure Laterality Date  . CESAREAN SECTION     x2  . CHOLECYSTECTOMY     20 years ago  . COLONOSCOPY N/A 09/12/2018   Procedure: COLONOSCOPY;  Surgeon: Rogene Houston, MD;  [facility];  Service: Endoscopy;  Laterality: N/A;  200  . IR IMAGING GUIDED PORT INSERTION  10/24/2017  . OMENTECTOMY N/A 10/02/2017   Procedure: OMENTECTOMY;  Surgeon: Everitt Amber, MD;  Location: [facility];   Service: Gynecology;  Laterality: N/A;  . OTHER SURGICAL HISTORY  07/2017   Left ovary removed  . ROBOTIC ASSISTED TOTAL HYSTERECTOMY WITH BILATERAL SALPINGO OOPHERECTOMY Right 10/02/2017   Procedure: XI ROBOTIC ASSISTED TOTAL HYSTERECTOMY WITH RIGHT SALPINGO OOPHORECTOMY;  Surgeon: Everitt Amber, MD;  [facility];  Service: Gynecology;  Laterality: Right;  . ROBOTIC PELVIC AND PARA-AORTIC LYMPH NODE DISSECTION Bilateral 10/02/2017   Procedure: XI ROBOTIC PELVIC LYMPH NODE DISSECTION;  Surgeon: Everitt Amber, MD;  [facility]: [facility];  Service: Gynecology;  Laterality: Bilateral;    There were no vitals filed for this visit.  Subjective Assessment - 02/05/19 0945    Subjective   S: Last night I had some pinching or stabbing pain. 0    Patient is accompanied by:  [place], [place] Contractor   Currently in Pain?  No/denies         [facility] OT Assessment - 02/05/19 0945      Assessment   Medical Diagnosis  Right Shoulder Pain      Precautions   Precautions  None               OT  Treatments/Exercises (OP) - 02/05/19 0950      Exercises   Exercises  Shoulder      Shoulder Exercises: Supine   Protraction  PROM;5 reps;Strengthening;15 reps    Protraction Weight (lbs)  1    Horizontal ABduction  PROM;5 reps;Strengthening;15 reps    Horizontal ABduction Weight (lbs)  1    External Rotation  PROM;5 reps;Strengthening;15 reps    External Rotation Weight (lbs)  1    Internal Rotation  PROM;5 reps;Strengthening;15 reps    Internal Rotation Weight (lbs)  1    Flexion  PROM;5 reps;Strengthening;15 reps    Shoulder Flexion Weight (lbs)  1    ABduction  PROM;5 reps;Strengthening;15 reps    Shoulder ABduction Weight (lbs)  1      Shoulder Exercises: Standing   Protraction  Strengthening;15 reps    Protraction Weight (lbs)  1    Horizontal ABduction  Strengthening;15 reps    Horizontal ABduction Weight (lbs)  1    External Rotation  Strengthening;15 reps     External Rotation Weight (lbs)  1    Internal Rotation  Strengthening;15 reps    Internal Rotation Weight (lbs)  1    Flexion  Strengthening;15 reps    Shoulder Flexion Weight (lbs)  1    ABduction  Strengthening;15 reps    Shoulder ABduction Weight (lbs)  1    Extension  Theraband;10 reps    Theraband Level (Shoulder Extension)  Level 3 (Green)    Row  [employer] reps    Theraband Level (Shoulder Row)  Level 3 (Green)    Retraction  Theraband;10 reps    Theraband Level (Shoulder Retraction)  Level 3 (Green)      Shoulder Exercises: Therapy Ball   Other Therapy Ball Exercises  green therapy ball: chest press, overhead press, flexion, circles each direction, 10X each      Shoulder Exercises: ROM/Strengthening   X to V Arms  15X, 1#    Proximal Shoulder Strengthening, Supine  15X with 1# no rest breaks    Proximal Shoulder Strengthening, Seated  15X with 1# no rest breaks    Ball on Wall  1' flexion 1' abduction      Manual Therapy   Manual Therapy  Myofascial release    Manual therapy comments  manual therapy completed seperately from all other interventions this date of service    Myofascial Release  myofasical release and manual stretching to decrease pain and fascial restrictions and improve pain free mobility in right shoulder and associated areas.               OT Education - 02/05/19 1015    Education Details  green theraband scapular strengthening    Person(s) Educated  Patient    Methods  Explanation;Demonstration;Handout    Comprehension  Verbalized understanding;Returned demonstration       OT Short Term Goals - 01/22/19 1708      OT SHORT TERM GOAL #1   Title  Patient will be educated and independent with HEP for right shoulder for increased use of right arm with functional tasks.    Time  4    Period  Weeks    Status  On-going    Target Date  02/19/19      OT SHORT TERM GOAL #2   Title  Patient will improve right shoulder A/ROM to WNL for improved  independence with daily tasks such as combing hair and fastening bra.    Time  4  Period  Weeks    Status  On-going      OT SHORT TERM GOAL #3   Title  Patient will improve right shoulder strength to 5/5 in order to lift items into overhead and carry grocery bags.    Time  4    Period  Weeks    Status  On-going      OT SHORT TERM GOAL #4   Title  Patient will decrease pain in her right shoulder to 2/10 or better, in order to use right arm as dominant with daily tasks.    Time  4    Period  Weeks    Status  On-going      OT SHORT TERM GOAL #5   Title  Patient will decrease right shoulder fascial restrictions to trace for improved mobility required for use of right arm as dominant with daily tasks.    Time  4    Period  Weeks    Status  On-going               Plan - 02/05/19 1000    Clinical Impression Statement  A: Pt reports using stationary bike and bands at home, pain that night. Continued with manual therapy, mod fascial restrictions noted in upper trapezius regions today. Continued with strengthening in supine and standing, added therapy ball strengthening today. Mod fatigue at end of session, verbal cuing for form and technique during session.    Body Structure / Function / Physical Skills  ADL;Decreased knowledge of use of DME;Strength;Pain;UE functional use;IADL;ROM;Fascial restriction;Flexibility;Muscle spasms    Plan  P: Continue with manual technique, d/c passive stretching. Follow up on HEP, add overhead lacing       Patient will benefit from skilled therapeutic intervention in order to improve the following deficits and impairments:   Body Structure / Function / Physical Skills: ADL, Decreased knowledge of use of DME, Strength, Pain, UE functional use, IADL, ROM, Fascial restriction, Flexibility, Muscle spasms       Visit Diagnosis: Stiffness of right shoulder, not elsewhere classified  Other symptoms and signs involving the musculoskeletal  system  Acute pain of right shoulder    Problem List Patient Active Problem List   Diagnosis Date Noted  . Obesity (BMI 30.0-34.9) 12/17/2018  . Hot flashes due to surgical menopause 12/17/2018  . Essential hypertension, benign 11/12/2018  . Rectal pain 08/13/2018  . Dysuria 12/10/2017  . Steroid-induced hyperglycemia 11/20/2017  . Peripheral neuropathy due to chemotherapy ([place]) 11/20/2017  . Arthralgia 11/20/2017  . Chemotherapy-induced nausea 11/20/2017  . Genetic testing 10/26/2017  . Elevated liver enzymes 10/25/2017  . Family history of prostate cancer   . Obesity 10/02/2017  . Urinary urgency 10/02/2017  . Ovarian cancer ([place]) 10/02/2017  . Ovarian cancer on left [facility]) 09/18/2017   Guadelupe Sabin, OTR/L  [phone] 02/05/2019, 10:28 AM  [place], [place], [address] Phone: [phone]   Fax:  [phone]  Name: Kristy Franklin MRN: [id number] Date of Birth: [birth year]

## 2019-02-05 NOTE — Patient Instructions (Signed)

## 2019-02-07 ENCOUNTER — Encounter (HOSPITAL_COMMUNITY): Payer: Self-pay

## 2019-02-07 ENCOUNTER — Ambulatory Visit (HOSPITAL_COMMUNITY): Payer: PRIVATE HEALTH INSURANCE

## 2019-02-07 ENCOUNTER — Other Ambulatory Visit: Payer: Self-pay

## 2019-02-07 DIAGNOSIS — R29898 Other symptoms and signs involving the musculoskeletal system: Secondary | ICD-10-CM

## 2019-02-07 DIAGNOSIS — M25511 Pain in right shoulder: Secondary | ICD-10-CM

## 2019-02-07 DIAGNOSIS — M25611 Stiffness of right shoulder, not elsewhere classified: Secondary | ICD-10-CM

## 2019-02-07 DIAGNOSIS — C562 Malignant neoplasm of left ovary: Secondary | ICD-10-CM | POA: Diagnosis not present

## 2019-02-07 NOTE — Therapy (Signed)
[place], [place], [address] Phone: [phone]   Fax:  [phone]  Occupational Therapy Treatment  Patient Details  Name: Kristy Franklin MRN: [id number] Date of Birth: 1970-10-17 Referring Provider (OT): Dr. Arther Abbott   Encounter Date: 02/07/2019  OT End of Session - 02/07/19 0919    Visit Number  6    Number of Visits  8    Date for OT Re-Evaluation  02/19/19    Authorization Type  [employer]    Authorization Time Period  30 visits through 04/20/19    Authorization - Visit Number  6    Authorization - Number of Visits  30    OT Start Time  0904    OT Stop Time  0942    OT Time Calculation (min)  38 min    Activity Tolerance  Patient tolerated treatment well    Behavior During Therapy  [facility] for tasks assessed/performed       Past Medical History:  Diagnosis Date  . Abdominal pain    Right mid only with bowel movements  . Blood in urine   . Change in bowel movement    more green color  . Essential hypertension, benign 11/12/2018  . Family history of prostate cancer   . GERD (gastroesophageal reflux disease)   . Headache    after menstrual cycle every month  . History of gallstones   . Hot flashes due to surgical menopause 12/17/2018  . Hypertension   . Neck pain on right side    pulsating  . Numbness and tingling of right leg   . Obesity   . Obesity (BMI 30.0-34.9) 12/17/2018  . Ovarian cancer, left ([place])   . PPD positive   . Skin rash    Chest, forehead, left arm    Past Surgical History:  Procedure Laterality Date  . CESAREAN SECTION     x2  . CHOLECYSTECTOMY     20 years ago  . COLONOSCOPY N/A 09/12/2018   Procedure: COLONOSCOPY;  Surgeon: Rogene Houston, MD;  [facility];  Service: Endoscopy;  Laterality: N/A;  200  . IR IMAGING GUIDED PORT INSERTION  10/24/2017  . OMENTECTOMY N/A 10/02/2017   Procedure: OMENTECTOMY;  Surgeon: Everitt Amber, MD;  [facility];   Service: Gynecology;  Laterality: N/A;  . OTHER SURGICAL HISTORY  07/2017   Left ovary removed  . ROBOTIC ASSISTED TOTAL HYSTERECTOMY WITH BILATERAL SALPINGO OOPHERECTOMY Right 10/02/2017   Procedure: XI ROBOTIC ASSISTED TOTAL HYSTERECTOMY WITH RIGHT SALPINGO OOPHORECTOMY;  Surgeon: Everitt Amber, MD;  [facility];  Service: Gynecology;  Laterality: Right;  . ROBOTIC PELVIC AND PARA-AORTIC LYMPH NODE DISSECTION Bilateral 10/02/2017   Procedure: XI ROBOTIC PELVIC LYMPH NODE DISSECTION;  Surgeon: Everitt Amber, MD;  [facility];  Service: Gynecology;  Laterality: Bilateral;    There were no vitals filed for this visit.  Subjective Assessment - 02/07/19 0913    Subjective   S: I wasn't able to do my exercises yesterday because I was helping at the church. When I got home I was really tired.    Patient is accompanied by:  [place] Contractor   Currently in Pain?  No/denies         [facility] OT Assessment - 02/07/19 0914      Assessment   Medical Diagnosis  Right Shoulder Pain      Precautions   Precautions  None  OT Treatments/Exercises (OP) - 02/07/19 0914      Exercises   Exercises  Shoulder      Shoulder Exercises: Supine   Protraction  Strengthening;15 reps    Protraction Weight (lbs)  3    External Rotation  Strengthening;15 reps    External Rotation Weight (lbs)  3    Internal Rotation  Strengthening;15 reps    Internal Rotation Weight (lbs)  3    Flexion  Strengthening;15 reps    Shoulder Flexion Weight (lbs)  3    ABduction  Strengthening;15 reps    Shoulder ABduction Weight (lbs)  2      Shoulder Exercises: Standing   Protraction  Strengthening;15 reps    Protraction Weight (lbs)  2    Horizontal ABduction  Strengthening;15 reps    Horizontal ABduction Weight (lbs)  2    External Rotation  Strengthening;15 reps    External Rotation Weight (lbs)  2    Internal Rotation  Strengthening;15 reps    Internal  Rotation Weight (lbs)  2    Flexion  Strengthening;15 reps    Shoulder Flexion Weight (lbs)  2    ABduction  Strengthening;15 reps    Shoulder ABduction Weight (lbs)  2      Shoulder Exercises: Therapy Ball   Other Therapy Ball Exercises  green therapy ball: chest press, overhead press, flexion, circles each direction, 10X each      Shoulder Exercises: ROM/Strengthening   Over Head Lace  seated 2'    Proximal Shoulder Strengthening, Supine  15X with 2# no rest breaks    Proximal Shoulder Strengthening, Seated  15X with 2# no rest breaks    Ball on Wall  1' flexion 1' abduction      Manual Therapy   Manual Therapy  Myofascial release    Manual therapy comments  manual therapy completed seperately from all other interventions this date of service    Myofascial Release  myofasical release and manual stretching to decrease pain and fascial restrictions and improve pain free mobility in right shoulder and associated areas.                 OT Short Term Goals - 01/22/19 1708      OT SHORT TERM GOAL #1   Title  Patient will be educated and independent with HEP for right shoulder for increased use of right arm with functional tasks.    Time  4    Period  Weeks    Status  On-going    Target Date  02/19/19      OT SHORT TERM GOAL #2   Title  Patient will improve right shoulder A/ROM to WNL for improved independence with daily tasks such as combing hair and fastening bra.    Time  4    Period  Weeks    Status  On-going      OT SHORT TERM GOAL #3   Title  Patient will improve right shoulder strength to 5/5 in order to lift items into overhead and carry grocery bags.    Time  4    Period  Weeks    Status  On-going      OT SHORT TERM GOAL #4   Title  Patient will decrease pain in her right shoulder to 2/10 or better, in order to use right arm as dominant with daily tasks.    Time  4    Period  Weeks    Status  On-going  OT SHORT TERM GOAL #5   Title  Patient will  decrease right shoulder fascial restrictions to trace for improved mobility required for use of right arm as dominant with daily tasks.    Time  4    Period  Weeks    Status  On-going               Plan - 02/07/19 1020    Clinical Impression Statement  A: Patient was able to progress to 2-3# weights supine and 2# hand weights standing to focus on shoulder strengthening and scapular stability. Patient required rest breaks as needed due to muscle fatigue.VC for form and technique were provided. Manual techniques were completed to address fascial restrctions; trace fascial restrictions palpated this date.    Body Structure / Function / Physical Skills  ADL;Decreased knowledge of use of DME;Strength;Pain;UE functional use;IADL;ROM;Fascial restriction;Flexibility;Muscle spasms    Plan  P: Complete manual techniques only if needed. D/C passive stretching. Y arm lift off.    Consulted and Agree with Plan of Care  Patient       Patient will benefit from skilled therapeutic intervention in order to improve the following deficits and impairments:   Body Structure / Function / Physical Skills: ADL, Decreased knowledge of use of DME, Strength, Pain, UE functional use, IADL, ROM, Fascial restriction, Flexibility, Muscle spasms       Visit Diagnosis: Other symptoms and signs involving the musculoskeletal system  Stiffness of right shoulder, not elsewhere classified  Acute pain of right shoulder    Problem List Patient Active Problem List   Diagnosis Date Noted  . Obesity (BMI 30.0-34.9) 12/17/2018  . Hot flashes due to surgical menopause 12/17/2018  . Essential hypertension, benign 11/12/2018  . Rectal pain 08/13/2018  . Dysuria 12/10/2017  . Steroid-induced hyperglycemia 11/20/2017  . 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 Arthralgia 11/20/2017  . Chemotherapy-induced nausea 11/20/2017  . Genetic testing 10/26/2017  . Elevated liver enzymes 10/25/2017  .  Family history of prostate cancer   . Obesity 10/02/2017  . Urinary urgency 10/02/2017  . Ovarian cancer ([place]) 10/02/2017  . Ovarian cancer on left [facility]) 09/18/2017   Ailene Ravel, OTR/L,CBIS  [phone]  02/07/2019, 10:56 AM  [place] [address] [place], [place], [address] Phone: [phone]   Fax:  [phone]  Name: Kristy Franklin MRN: [id number] Date of Birth: 02/05/1971

## 2019-02-11 ENCOUNTER — Other Ambulatory Visit: Payer: Self-pay

## 2019-02-11 ENCOUNTER — Ambulatory Visit (HOSPITAL_COMMUNITY): Payer: PRIVATE HEALTH INSURANCE

## 2019-02-11 ENCOUNTER — Other Ambulatory Visit (INDEPENDENT_AMBULATORY_CARE_PROVIDER_SITE_OTHER): Payer: Self-pay | Admitting: Internal Medicine

## 2019-02-11 ENCOUNTER — Encounter (HOSPITAL_COMMUNITY): Payer: Self-pay

## 2019-02-11 DIAGNOSIS — M25611 Stiffness of right shoulder, not elsewhere classified: Secondary | ICD-10-CM

## 2019-02-11 DIAGNOSIS — R29898 Other symptoms and signs involving the musculoskeletal system: Secondary | ICD-10-CM

## 2019-02-11 DIAGNOSIS — C562 Malignant neoplasm of left ovary: Secondary | ICD-10-CM | POA: Diagnosis not present

## 2019-02-11 DIAGNOSIS — M25511 Pain in right shoulder: Secondary | ICD-10-CM

## 2019-02-11 DIAGNOSIS — K6289 Other specified diseases of anus and rectum: Secondary | ICD-10-CM

## 2019-02-11 NOTE — Therapy (Signed)
[place], [place], [address] Phone: [phone]   Fax:  [phone]  Occupational Therapy Treatment  Patient Details  Name: Kristy Franklin MRN: [id number] Date of Birth: 1970-04-28 Referring Provider (OT): Dr. Arther Abbott   Encounter Date: 02/11/2019  OT End of Session - 02/11/19 1002    Visit Number  7    Number of Visits  8    Date for OT Re-Evaluation  02/19/19    Authorization Type  [employer]    Authorization Time Period  30 visits through 04/20/19    Authorization - Visit Number  7    Authorization - Number of Visits  30    OT Start Time  [phone]   pt checked in late   OT Stop Time  1023    OT Time Calculation (min)  35 min    Activity Tolerance  Patient tolerated treatment well    Behavior During Therapy  [facility] for tasks assessed/performed       Past Medical History:  Diagnosis Date  . Abdominal pain    Right mid only with bowel movements  . Blood in urine   . Change in bowel movement    more green color  . Essential hypertension, benign 11/12/2018  . Family history of prostate cancer   . GERD (gastroesophageal reflux disease)   . Headache    after menstrual cycle every month  . History of gallstones   . Hot flashes due to surgical menopause 12/17/2018  . Hypertension   . Neck pain on right side    pulsating  . Numbness and tingling of right leg   . Obesity   . Obesity (BMI 30.0-34.9) 12/17/2018  . Ovarian cancer, left ([place])   . PPD positive   . Skin rash    Chest, forehead, left arm    Past Surgical History:  Procedure Laterality Date  . CESAREAN SECTION     x2  . CHOLECYSTECTOMY     20 years ago  . COLONOSCOPY N/A 09/12/2018   Procedure: COLONOSCOPY;  Surgeon: Rogene Houston, MD;  [facility];  Service: Endoscopy;  Laterality: N/A;  200  . IR IMAGING GUIDED PORT INSERTION  10/24/2017  . OMENTECTOMY N/A 10/02/2017   Procedure: OMENTECTOMY;  Surgeon: Everitt Amber, MD;   [facility];  Service: Gynecology;  Laterality: N/A;  . OTHER SURGICAL HISTORY  07/2017   Left ovary removed  . ROBOTIC ASSISTED TOTAL HYSTERECTOMY WITH BILATERAL SALPINGO OOPHERECTOMY Right 10/02/2017   Procedure: XI ROBOTIC ASSISTED TOTAL HYSTERECTOMY WITH RIGHT SALPINGO OOPHORECTOMY;  Surgeon: Everitt Amber, MD;  [facility];  Service: Gynecology;  Laterality: Right;  . ROBOTIC PELVIC AND PARA-AORTIC LYMPH NODE DISSECTION Bilateral 10/02/2017   Procedure: XI ROBOTIC PELVIC LYMPH NODE DISSECTION;  Surgeon: Everitt Amber, MD;  [facility];  Service: Gynecology;  Laterality: Bilateral;    There were no vitals filed for this visit.  Subjective Assessment - 02/11/19 0956    Subjective   S: Yesterday it was hurting but today it's ok.    Patient is accompanied by:  [place] [employer]   Currently in Pain?  No/denies         [facility] OT Assessment - 02/11/19 0957      Assessment   Medical Diagnosis  Right Shoulder Pain      Precautions   Precautions  None  OT Treatments/Exercises (OP) - 02/11/19 0957      Exercises   Exercises  Shoulder      Shoulder Exercises: Supine   Protraction  Strengthening;15 reps    Protraction Weight (lbs)  3    Horizontal ABduction  Strengthening;15 reps    Horizontal ABduction Weight (lbs)  3    External Rotation  Strengthening;15 reps    External Rotation Weight (lbs)  3    Internal Rotation  Strengthening;15 reps    Internal Rotation Weight (lbs)  3    Flexion  Strengthening;15 reps    Shoulder Flexion Weight (lbs)  3    ABduction  Strengthening;15 reps    Shoulder ABduction Weight (lbs)  2      Shoulder Exercises: Standing   Protraction  Strengthening;10 reps    Protraction Weight (lbs)  3    Horizontal ABduction  Strengthening;10 reps    Horizontal ABduction Weight (lbs)  3    External Rotation  Strengthening;10 reps    External Rotation Weight (lbs)  3    Internal Rotation   Strengthening;10 reps    Internal Rotation Weight (lbs)  3    Flexion  Strengthening;10 reps    Shoulder Flexion Weight (lbs)  3    ABduction  Strengthening;10 reps    Shoulder ABduction Weight (lbs)  3      Shoulder Exercises: ROM/Strengthening   UBE (Upper Arm Bike)  Level 2 2' reverse 2' forward   pace: 5.0-7.0   Over Head Lace  seated 2'    X to V Arms  10X 2#    Proximal Shoulder Strengthening, Seated  15X with 2# no rest breaks    Ball on Wall  1' flexion 1' abduction    Other ROM/Strengthening Exercises  Y arm lift off; 10X               OT Short Term Goals - 01/22/19 1708      OT SHORT TERM GOAL #1   Title  Patient will be educated and independent with HEP for right shoulder for increased use of right arm with functional tasks.    Time  4    Period  Weeks    Status  On-going    Target Date  02/19/19      OT SHORT TERM GOAL #2   Title  Patient will improve right shoulder A/ROM to WNL for improved independence with daily tasks such as combing hair and fastening bra.    Time  4    Period  Weeks    Status  On-going      OT SHORT TERM GOAL #3   Title  Patient will improve right shoulder strength to 5/5 in order to lift items into overhead and carry grocery bags.    Time  4    Period  Weeks    Status  On-going      OT SHORT TERM GOAL #4   Title  Patient will decrease pain in her right shoulder to 2/10 or better, in order to use right arm as dominant with daily tasks.    Time  4    Period  Weeks    Status  On-going      OT SHORT TERM GOAL #5   Title  Patient will decrease right shoulder fascial restrictions to trace for improved mobility required for use of right arm as dominant with daily tasks.    Time  4    Period  Weeks    Status  On-going               Plan - 02/11/19 1306    Clinical Impression Statement  A: No manual techniques or passive ROM required this date. Focused on strengthening while progressing to 3# hand weights standing.  Patient was provided VC for form and technique as needed. Displayed muscle fatigue with some exercises and was able to take rest breaks as needed.    Body Structure / Function / Physical Skills  ADL;Decreased knowledge of use of DME;Strength;Pain;UE functional use;IADL;ROM;Fascial restriction;Flexibility;Muscle spasms    Plan  P: Complete manual techniques only if needed. D/C passive stretching. Continue to work on shoulder and scapular stability.    Consulted and Agree with Plan of Care  Patient       Patient will benefit from skilled therapeutic intervention in order to improve the following deficits and impairments:   Body Structure / Function / Physical Skills: ADL, Decreased knowledge of use of DME, Strength, Pain, UE functional use, IADL, ROM, Fascial restriction, Flexibility, Muscle spasms       Visit Diagnosis: Other symptoms and signs involving the musculoskeletal system  Stiffness of right shoulder, not elsewhere classified  Acute pain of right shoulder    Problem List Patient Active Problem List   Diagnosis Date Noted  . Obesity (BMI 30.0-34.9) 12/17/2018  . Hot flashes due to surgical menopause 12/17/2018  . Essential hypertension, benign 11/12/2018  . Rectal pain 08/13/2018  . Dysuria 12/10/2017  . Steroid-induced hyperglycemia 11/20/2017  . 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 Arthralgia 11/20/2017  . Chemotherapy-induced nausea 11/20/2017  . Genetic testing 10/26/2017  . Elevated liver enzymes 10/25/2017  . Family history of prostate cancer   . Obesity 10/02/2017  . Urinary urgency 10/02/2017  . Ovarian cancer ([place]) 10/02/2017  . Ovarian cancer on left [facility]) 09/18/2017   Ailene Ravel, OTR/L,CBIS  [phone]  02/11/2019, 1:08 PM  [place] [address] [place], [place], [address] Phone: [phone]   Fax:  [phone]  Name: Luree Winslow MRN: [id number] Date of Birth:  [birth year]

## 2019-02-13 ENCOUNTER — Other Ambulatory Visit: Payer: Self-pay

## 2019-02-13 ENCOUNTER — Ambulatory Visit (HOSPITAL_COMMUNITY): Payer: PRIVATE HEALTH INSURANCE

## 2019-02-13 ENCOUNTER — Encounter (HOSPITAL_COMMUNITY): Payer: Self-pay

## 2019-02-13 DIAGNOSIS — C562 Malignant neoplasm of left ovary: Secondary | ICD-10-CM | POA: Diagnosis not present

## 2019-02-13 DIAGNOSIS — R29898 Other symptoms and signs involving the musculoskeletal system: Secondary | ICD-10-CM

## 2019-02-13 DIAGNOSIS — M25611 Stiffness of right shoulder, not elsewhere classified: Secondary | ICD-10-CM

## 2019-02-13 DIAGNOSIS — M25511 Pain in right shoulder: Secondary | ICD-10-CM

## 2019-02-13 NOTE — Therapy (Signed)
[place], [place], [address] Phone: [phone]   Fax:  [phone]  Occupational Therapy Treatment  Patient Details  Name: Kristy Franklin MRN: [id number] Date of Birth: 20-Nov-1970 Referring Provider (OT): Dr. Arther Abbott   Encounter Date: 02/13/2019  OT End of Session - 02/13/19 0852    Visit Number  8    Number of Visits  10    Date for OT Re-Evaluation  02/19/19    Authorization Type  [employer]    Authorization Time Period  30 visits through 04/20/19    Authorization - Visit Number  8    Authorization - Number of Visits  30    OT Start Time  0818    OT Stop Time  0856    OT Time Calculation (min)  38 min    Activity Tolerance  Patient tolerated treatment well    Behavior During Therapy  [facility] for tasks assessed/performed       Past Medical History:  Diagnosis Date  . Abdominal pain    Right mid only with bowel movements  . Blood in urine   . Change in bowel movement    more green color  . Essential hypertension, benign 11/12/2018  . Family history of prostate cancer   . GERD (gastroesophageal reflux disease)   . Headache    after menstrual cycle every month  . History of gallstones   . Hot flashes due to surgical menopause 12/17/2018  . Hypertension   . Neck pain on right side    pulsating  . Numbness and tingling of right leg   . Obesity   . Obesity (BMI 30.0-34.9) 12/17/2018  . Ovarian cancer, left ([place])   . PPD positive   . Skin rash    Chest, forehead, left arm    Past Surgical History:  Procedure Laterality Date  . CESAREAN SECTION     x2  . CHOLECYSTECTOMY     20 years ago  . COLONOSCOPY N/A 09/12/2018   Procedure: COLONOSCOPY;  Surgeon: Rogene Houston, MD;  [facility];  Service: Endoscopy;  Laterality: N/A;  200  . IR IMAGING GUIDED PORT INSERTION  10/24/2017  . OMENTECTOMY N/A 10/02/2017   Procedure: OMENTECTOMY;  Surgeon: Everitt Amber, MD;  [facility];   Service: Gynecology;  Laterality: N/A;  . OTHER SURGICAL HISTORY  07/2017   Left ovary removed  . ROBOTIC ASSISTED TOTAL HYSTERECTOMY WITH BILATERAL SALPINGO OOPHERECTOMY Right 10/02/2017   Procedure: XI ROBOTIC ASSISTED TOTAL HYSTERECTOMY WITH RIGHT SALPINGO OOPHORECTOMY;  Surgeon: Everitt Amber, MD;  [facility];  Service: Gynecology;  Laterality: Right;  . ROBOTIC PELVIC AND PARA-AORTIC LYMPH NODE DISSECTION Bilateral 10/02/2017   Procedure: XI ROBOTIC PELVIC LYMPH NODE DISSECTION;  Surgeon: Everitt Amber, MD;  [facility];  Service: Gynecology;  Laterality: Bilateral;    There were no vitals filed for this visit.  Subjective Assessment - 02/13/19 0839    Subjective   S: I can tell I'm stronger.    Patient is accompanied by:  [employer] [employer] contractor.   Currently in Pain?  No/denies         [facility] OT Assessment - 02/13/19 0840      Assessment   Medical Diagnosis  Right Shoulder Pain      Precautions   Precautions  None               OT Treatments/Exercises (OP) - 02/13/19 0840  Exercises   Exercises  Shoulder      Shoulder Exercises: Supine   Protraction  Theraband;15 reps    Theraband Level (Shoulder Protraction)  Level 2 (Red)    Horizontal ABduction  Theraband;15 reps    Theraband Level (Shoulder Horizontal ABduction)  Level 2 (Red)    External Rotation  Theraband;10 reps    Theraband Level (Shoulder External Rotation)  Level 2 (Red)    Internal Rotation  Theraband;10 reps    Theraband Level (Shoulder Internal Rotation)  Level 2 (Red)    Flexion  Theraband;10 reps    Theraband Level (Shoulder Flexion)  Level 2 (Red)    ABduction  Theraband;10 reps    Theraband Level (Shoulder ABduction)  Level 2 (Red)      Shoulder Exercises: Standing   Protraction  Strengthening;10 reps    Protraction Weight (lbs)  3    Horizontal ABduction  Strengthening;10 reps    Horizontal ABduction Weight (lbs)  3    External Rotation   Strengthening;10 reps    External Rotation Weight (lbs)  3    Internal Rotation  Strengthening;10 reps    Internal Rotation Weight (lbs)  3    Flexion  Strengthening;10 reps    Shoulder Flexion Weight (lbs)  3    ABduction  Strengthening;10 reps    Shoulder ABduction Weight (lbs)  3      Shoulder Exercises: Therapy Ball   Other Therapy Ball Exercises  green therapy ball: chest press, flexion, circles each direction, 12X each      Shoulder Exercises: ROM/Strengthening   Over Head Lace  seated 2'    X to V Arms  12X 2#    Proximal Shoulder Strengthening, Seated  10X with 3# no rest breaks    Ball on Wall  1' flexion 1' abduction               OT Short Term Goals - 01/22/19 1708      OT SHORT TERM GOAL #1   Title  Patient will be educated and independent with HEP for right shoulder for increased use of right arm with functional tasks.    Time  4    Period  Weeks    Status  On-going    Target Date  02/19/19      OT SHORT TERM GOAL #2   Title  Patient will improve right shoulder A/ROM to WNL for improved independence with daily tasks such as combing hair and fastening bra.    Time  4    Period  Weeks    Status  On-going      OT SHORT TERM GOAL #3   Title  Patient will improve right shoulder strength to 5/5 in order to lift items into overhead and carry grocery bags.    Time  4    Period  Weeks    Status  On-going      OT SHORT TERM GOAL #4   Title  Patient will decrease pain in her right shoulder to 2/10 or better, in order to use right arm as dominant with daily tasks.    Time  4    Period  Weeks    Status  On-going      OT SHORT TERM GOAL #5   Title  Patient will decrease right shoulder fascial restrictions to trace for improved mobility required for use of right arm as dominant with daily tasks.    Time  4    Period  Weeks  Status  On-going               Plan - 02/13/19 [id number]    Clinical Impression Statement  A: Completed shoulder and scapular  strengthening using red theraband. Reports of some pain in the right medial deltoid region towards end of repetitions. Some muscle fatigue noted during exercises. VC for form and technique.    Body Structure / Function / Physical Skills  ADL;Decreased knowledge of use of DME;Strength;Pain;UE functional use;IADL;ROM;Fascial restriction;Flexibility;Muscle spasms    Plan  P: Complete manual techniques only if needed. D/C passive stretching. Continue to work on shoulder and scapular stability. Attempt prone strengthening.    Consulted and Agree with Plan of Care  Patient       Patient will benefit from skilled therapeutic intervention in order to improve the following deficits and impairments:   Body Structure / Function / Physical Skills: ADL, Decreased knowledge of use of DME, Strength, Pain, UE functional use, IADL, ROM, Fascial restriction, Flexibility, Muscle spasms       Visit Diagnosis: Acute pain of right shoulder  Stiffness of right shoulder, not elsewhere classified  Other symptoms and signs involving the musculoskeletal system    Problem List Patient Active Problem List   Diagnosis Date Noted  . Obesity (BMI 30.0-34.9) 12/17/2018  . Hot flashes due to surgical menopause 12/17/2018  . Essential hypertension, benign 11/12/2018  . Rectal pain 08/13/2018  . Dysuria 12/10/2017  . Steroid-induced hyperglycemia 11/20/2017  . 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 Arthralgia 11/20/2017  . Chemotherapy-induced nausea 11/20/2017  . Genetic testing 10/26/2017  . Elevated liver enzymes 10/25/2017  . Family history of prostate cancer   . Obesity 10/02/2017  . Urinary urgency 10/02/2017  . Ovarian cancer ([place]) 10/02/2017  . Ovarian cancer on left [facility]) 09/18/2017   Ailene Ravel, OTR/L,CBIS  [phone]  02/13/2019, [place] [address] [place], [place], [address] Phone: [phone]   Fax:   [phone]  Name: Kristy Franklin MRN: [id number] Date of Birth: 1970/05/07

## 2019-02-17 ENCOUNTER — Encounter (HOSPITAL_COMMUNITY): Payer: Self-pay | Admitting: Occupational Therapy

## 2019-02-17 ENCOUNTER — Ambulatory Visit (HOSPITAL_COMMUNITY): Payer: PRIVATE HEALTH INSURANCE | Admitting: Occupational Therapy

## 2019-02-17 ENCOUNTER — Other Ambulatory Visit: Payer: Self-pay

## 2019-02-17 DIAGNOSIS — M25611 Stiffness of right shoulder, not elsewhere classified: Secondary | ICD-10-CM

## 2019-02-17 DIAGNOSIS — M25511 Pain in right shoulder: Secondary | ICD-10-CM

## 2019-02-17 DIAGNOSIS — R29898 Other symptoms and signs involving the musculoskeletal system: Secondary | ICD-10-CM

## 2019-02-17 DIAGNOSIS — C562 Malignant neoplasm of left ovary: Secondary | ICD-10-CM | POA: Diagnosis not present

## 2019-02-17 NOTE — Therapy (Signed)
[place], [place], [address] Phone: [phone]   Fax:  [phone]  Occupational Therapy Treatment  Patient Details  Name: Kristy Franklin MRN: [id number] Date of Birth: 04-13-1970 Referring Provider (OT): Dr. Arther Abbott   Encounter Date: 02/17/2019  OT End of Session - 02/17/19 1051    Visit Number  9    Number of Visits  10    Date for OT Re-Evaluation  02/19/19    Authorization Type  [employer]    Authorization Time Period  30 visits through 04/20/19    Authorization - Visit Number  9    Authorization - Number of Visits  30    OT Start Time  0950    OT Stop Time  1030    OT Time Calculation (min)  40 min    Activity Tolerance  Patient tolerated treatment well    Behavior During Therapy  [facility] for tasks assessed/performed       Past Medical History:  Diagnosis Date  . Abdominal pain    Right mid only with bowel movements  . Blood in urine   . Change in bowel movement    more green color  . Essential hypertension, benign 11/12/2018  . Family history of prostate cancer   . GERD (gastroesophageal reflux disease)   . Headache    after menstrual cycle every month  . History of gallstones   . Hot flashes due to surgical menopause 12/17/2018  . Hypertension   . Neck pain on right side    pulsating  . Numbness and tingling of right leg   . Obesity   . Obesity (BMI 30.0-34.9) 12/17/2018  . Ovarian cancer, left ([place])   . PPD positive   . Skin rash    Chest, forehead, left arm    Past Surgical History:  Procedure Laterality Date  . CESAREAN SECTION     x2  . CHOLECYSTECTOMY     20 years ago  . COLONOSCOPY N/A 09/12/2018   Procedure: COLONOSCOPY;  Surgeon: Rogene Houston, MD;  [facility];  Service: Endoscopy;  Laterality: N/A;  200  . IR IMAGING GUIDED PORT INSERTION  10/24/2017  . OMENTECTOMY N/A 10/02/2017   Procedure: OMENTECTOMY;  Surgeon: Everitt Amber, MD;  [facility];   Service: Gynecology;  Laterality: N/A;  . OTHER SURGICAL HISTORY  07/2017   Left ovary removed  . ROBOTIC ASSISTED TOTAL HYSTERECTOMY WITH BILATERAL SALPINGO OOPHERECTOMY Right 10/02/2017   Procedure: XI ROBOTIC ASSISTED TOTAL HYSTERECTOMY WITH RIGHT SALPINGO OOPHORECTOMY;  Surgeon: Everitt Amber, MD;  [facility];  Service: Gynecology;  Laterality: Right;  . ROBOTIC PELVIC AND PARA-AORTIC LYMPH NODE DISSECTION Bilateral 10/02/2017   Procedure: XI ROBOTIC PELVIC LYMPH NODE DISSECTION;  Surgeon: Everitt Amber, MD;  [facility];  Service: Gynecology;  Laterality: Bilateral;    There were no vitals filed for this visit.  Subjective Assessment - 02/17/19 0951    Subjective   S: I felt a stabbing pain this weekend but when I moved it stopped.    Currently in Pain?  No/denies         [facility] OT Assessment - 02/17/19 0951      Assessment   Medical Diagnosis  Right Shoulder Pain      Precautions   Precautions  None               OT Treatments/Exercises (OP) - 02/17/19 [id number]      Exercises  Exercises  Shoulder      Shoulder Exercises: Supine   Protraction  Theraband;15 reps    Theraband Level (Shoulder Protraction)  Level 2 (Red)    Horizontal ABduction  Theraband;15 reps    Theraband Level (Shoulder Horizontal ABduction)  Level 2 (Red)    External Rotation  Theraband;15 reps    Theraband Level (Shoulder External Rotation)  Level 2 (Red)    Internal Rotation  Theraband;15 reps    Theraband Level (Shoulder Internal Rotation)  Level 2 (Red)    Flexion  Theraband;15 reps    Theraband Level (Shoulder Flexion)  Level 2 (Red)    ABduction  Theraband;15 reps    Theraband Level (Shoulder ABduction)  Level 2 (Red)      Shoulder Exercises: Prone   Flexion  Strengthening;10 reps    Flexion Weight (lbs)  2    Extension  Strengthening;10 reps    Extension Weight (lbs)  2    Horizontal ABduction 1  Strengthening;10 reps    Horizontal ABduction 1 Weight (lbs)  2    Horizontal  ABduction 2  Strengthening;10 reps    Horizontal ABduction 2 Weight (lbs)  2      Shoulder Exercises: Standing   Protraction  Strengthening;15 reps    Protraction Weight (lbs)  3    Horizontal ABduction  Strengthening;10 reps    Horizontal ABduction Weight (lbs)  3    External Rotation  Strengthening;10 reps    External Rotation Weight (lbs)  3    Internal Rotation  Strengthening;10 reps    Internal Rotation Weight (lbs)  3    Flexion  Strengthening;15 reps    Shoulder Flexion Weight (lbs)  3    ABduction  Strengthening;10 reps    Shoulder ABduction Weight (lbs)  3      Shoulder Exercises: Therapy Ball   Other Therapy Ball Exercises  green therapy ball: chest press, flexion, circles each direction, 12X each      Shoulder Exercises: ROM/Strengthening   UBE (Upper Arm Bike)  Level 2 3' foward 3' reverse    X to V Arms  10X, 3#    Proximal Shoulder Strengthening, Seated  10X with 3# no rest breaks    Ball on Wall  1' flexion 1' abduction with red ball    Other ROM/Strengthening Exercises  Y arm lift off; 10X    Other ROM/Strengthening Exercises  ball pass behind back working on IR, 5X with significant popping      Shoulder Exercises: Stretch   Internal Rotation Stretch  3 reps   10" vertical towel              OT Short Term Goals - 01/22/19 1708      OT SHORT TERM GOAL #1   Title  Patient will be educated and independent with HEP for right shoulder for increased use of right arm with functional tasks.    Time  4    Period  Weeks    Status  On-going    Target Date  02/19/19      OT SHORT TERM GOAL #2   Title  Patient will improve right shoulder A/ROM to WNL for improved independence with daily tasks such as combing hair and fastening bra.    Time  4    Period  Weeks    Status  On-going      OT SHORT TERM GOAL #3   Title  Patient will improve right shoulder strength to 5/5 in order to lift  items into overhead and carry grocery bags.    Time  4    Period  Weeks     Status  On-going      OT SHORT TERM GOAL #4   Title  Patient will decrease pain in her right shoulder to 2/10 or better, in order to use right arm as dominant with daily tasks.    Time  4    Period  Weeks    Status  On-going      OT SHORT TERM GOAL #5   Title  Patient will decrease right shoulder fascial restrictions to trace for improved mobility required for use of right arm as dominant with daily tasks.    Time  4    Period  Weeks    Status  On-going               Plan - 02/17/19 1026    Clinical Impression Statement  A: Pt reports the only motion she is having difficulty with is IR. Added gentle towel stretch and ball pass, significant popping noted with ball pass. Continued with strengthening using 2# and 3# weights, added prone strengthening this session. Pt with good form, occasional cuing required for form and technique.    Body Structure / Function / Physical Skills  ADL;Decreased knowledge of use of DME;Strength;Pain;UE functional use;IADL;ROM;Fascial restriction;Flexibility;Muscle spasms    Plan  P: Reassessment, FOTO, discharge with updated HEP including IR stretch       Patient will benefit from skilled therapeutic intervention in order to improve the following deficits and impairments:   Body Structure / Function / Physical Skills: ADL, Decreased knowledge of use of DME, Strength, Pain, UE functional use, IADL, ROM, Fascial restriction, Flexibility, Muscle spasms       Visit Diagnosis: Acute pain of right shoulder  Stiffness of right shoulder, not elsewhere classified  Other symptoms and signs involving the musculoskeletal system    Problem List Patient Active Problem List   Diagnosis Date Noted  . Obesity (BMI 30.0-34.9) 12/17/2018  . Hot flashes due to surgical menopause 12/17/2018  . Essential hypertension, benign 11/12/2018  . Rectal pain 08/13/2018  . Dysuria 12/10/2017  . Steroid-induced hyperglycemia 11/20/2017  . Peripheral neuropathy  due to chemotherapy ([place]) 11/20/2017  . Arthralgia 11/20/2017  . Chemotherapy-induced nausea 11/20/2017  . Genetic testing 10/26/2017  . Elevated liver enzymes 10/25/2017  . Family history of prostate cancer   . Obesity 10/02/2017  . Urinary urgency 10/02/2017  . Ovarian cancer ([place]) 10/02/2017  . Ovarian cancer on left [facility]) 09/18/2017   Guadelupe Sabin, OTR/L  [phone] 02/17/2019, 10:53 AM  [place], [place], [address] Phone: [phone]   Fax:  [phone]  Name: Lovely Gavilanes MRN: [id number] Date of Birth: 10/25/1970

## 2019-02-18 ENCOUNTER — Encounter: Payer: Self-pay | Admitting: Gynecologic Oncology

## 2019-02-18 ENCOUNTER — Encounter (HOSPITAL_COMMUNITY): Payer: Self-pay

## 2019-02-18 ENCOUNTER — Inpatient Hospital Stay: Payer: PRIVATE HEALTH INSURANCE | Attending: Gynecologic Oncology | Admitting: Gynecologic Oncology

## 2019-02-18 ENCOUNTER — Inpatient Hospital Stay: Payer: PRIVATE HEALTH INSURANCE

## 2019-02-18 ENCOUNTER — Ambulatory Visit (HOSPITAL_COMMUNITY): Payer: PRIVATE HEALTH INSURANCE

## 2019-02-18 ENCOUNTER — Other Ambulatory Visit: Payer: Self-pay

## 2019-02-18 ENCOUNTER — Ambulatory Visit: Payer: PRIVATE HEALTH INSURANCE

## 2019-02-18 VITALS — BP 112/72 | HR 79 | Temp 98.9°F | Resp 18 | Ht 63.0 in | Wt 168.1 lb

## 2019-02-18 DIAGNOSIS — M25611 Stiffness of right shoulder, not elsewhere classified: Secondary | ICD-10-CM

## 2019-02-18 DIAGNOSIS — Z8042 Family history of malignant neoplasm of prostate: Secondary | ICD-10-CM | POA: Insufficient documentation

## 2019-02-18 DIAGNOSIS — Z8542 Personal history of malignant neoplasm of other parts of uterus: Secondary | ICD-10-CM

## 2019-02-18 DIAGNOSIS — I898 Other specified noninfective disorders of lymphatic vessels and lymph nodes: Secondary | ICD-10-CM | POA: Insufficient documentation

## 2019-02-18 DIAGNOSIS — Z8543 Personal history of malignant neoplasm of ovary: Secondary | ICD-10-CM

## 2019-02-18 DIAGNOSIS — R109 Unspecified abdominal pain: Secondary | ICD-10-CM

## 2019-02-18 DIAGNOSIS — R29898 Other symptoms and signs involving the musculoskeletal system: Secondary | ICD-10-CM

## 2019-02-18 DIAGNOSIS — Z9221 Personal history of antineoplastic chemotherapy: Secondary | ICD-10-CM | POA: Diagnosis not present

## 2019-02-18 DIAGNOSIS — Z8349 Family history of other endocrine, nutritional and metabolic diseases: Secondary | ICD-10-CM | POA: Insufficient documentation

## 2019-02-18 DIAGNOSIS — I10 Essential (primary) hypertension: Secondary | ICD-10-CM | POA: Insufficient documentation

## 2019-02-18 DIAGNOSIS — C562 Malignant neoplasm of left ovary: Secondary | ICD-10-CM | POA: Diagnosis not present

## 2019-02-18 DIAGNOSIS — Z79899 Other long term (current) drug therapy: Secondary | ICD-10-CM | POA: Insufficient documentation

## 2019-02-18 DIAGNOSIS — E669 Obesity, unspecified: Secondary | ICD-10-CM | POA: Insufficient documentation

## 2019-02-18 DIAGNOSIS — C541 Malignant neoplasm of endometrium: Secondary | ICD-10-CM | POA: Insufficient documentation

## 2019-02-18 DIAGNOSIS — Z833 Family history of diabetes mellitus: Secondary | ICD-10-CM | POA: Insufficient documentation

## 2019-02-18 DIAGNOSIS — Z8249 Family history of ischemic heart disease and other diseases of the circulatory system: Secondary | ICD-10-CM | POA: Insufficient documentation

## 2019-02-18 DIAGNOSIS — Z90710 Acquired absence of both cervix and uterus: Secondary | ICD-10-CM | POA: Insufficient documentation

## 2019-02-18 DIAGNOSIS — M25511 Pain in right shoulder: Secondary | ICD-10-CM

## 2019-02-18 DIAGNOSIS — K30 Functional dyspepsia: Secondary | ICD-10-CM | POA: Insufficient documentation

## 2019-02-18 DIAGNOSIS — Z90721 Acquired absence of ovaries, unilateral: Secondary | ICD-10-CM | POA: Insufficient documentation

## 2019-02-18 DIAGNOSIS — R1032 Left lower quadrant pain: Secondary | ICD-10-CM | POA: Insufficient documentation

## 2019-02-18 DIAGNOSIS — R6881 Early satiety: Secondary | ICD-10-CM | POA: Insufficient documentation

## 2019-02-18 LAB — BASIC METABOLIC PANEL
Anion gap: 12 (ref 5–15)
BUN: 13 mg/dL (ref 6–20)
CO2: 23 mmol/L (ref 22–32)
Calcium: 9 mg/dL (ref 8.9–10.3)
Chloride: 106 mmol/L (ref 98–111)
Creatinine, Ser: 0.68 mg/dL (ref 0.44–1.00)
GFR calc Af Amer: >60 mL/min (ref >60)
GFR calc non Af Amer: >60 mL/min (ref >60)
Glucose, Bld: 91 mg/dL (ref 70–99)
Potassium: 4.2 mmol/L (ref 3.5–5.1)
Sodium: 141 mmol/L (ref 135–145)

## 2019-02-18 MED ORDER — SODIUM CHLORIDE 0.9% FLUSH
10.0000 mL | Freq: Once | INTRAVENOUS | Status: AC
  Administered 2019-02-18: 10 mL
  Filled 2019-02-18: qty 10

## 2019-02-18 MED ORDER — HEPARIN SOD (PORK) LOCK FLUSH 100 UNIT/ML IV SOLN
250.0000 [IU] | Freq: Once | INTRAVENOUS | Status: AC
  Administered 2019-02-18: 500 [IU]
  Filled 2019-02-18: qty 5

## 2019-02-18 NOTE — Progress Notes (Signed)
Follow-up Note: Gyn-Onc  Consult was intially requested by Dr. Barrie Dunker for the evaluation of Kristy Franklin 48 y.o. female  CC:  Chief Complaint  Patient presents with  . Ovarian cancer on left [facility])   Seen with translator (spanish)  Assessment/Plan:  Kristy Franklin  is a 48 y.o.  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 BRCA negative.  S/p completion of chemotherapy with 6 cycles adjuvant carboplatin and paclitaxel in December, 2019.   No apparent recurrence though has new symptoms of GI distress. Will check with CT abd/pelvis.    Will see her back at 3 monthly intervals with CA 125.   HPI: Kristy Franklin is a 48 year old P2 who is seen in consultation at the request of Dr Barrie Dunker for high grade serous ovarian cancer, clinical stage IC.  The patient reports a history of having seen the emergency room in [place] in May or June 2019 when she developed left lower quadrant pain.  During the ER visit an ultrasound performed which identified an adnexal mass on the left, with no signs of torsion.  She was then seen in the offices of Dr. Barrie Dunker in June 2019 and a plan was made to proceed with surgical removal of the adnexa.  On August 23, 2017 she was taken to the operating room for a laparoscopic left salpingo-oophorectomy.  Review of the operative note suggest that intraoperative findings were significant for a 6 cm left adnexal mass that was stuck in the cul-de-sac with some paraovarian adhesions noted.  It did not appear to be malignant in appearance to the surgeons at the time of surgery.  The upper abdomen, diaphragms, and omentum were commented on as looking normal.  There was no ascites.  The left tube and ovary was removed, however there was some fragmentation of the specimen during removal, and therefore the left tube and ovary were sent as 2 specimens.  The first specimen is  labeled ovary biopsy for frozen section which revealed serous carcinoma and the second specimen is labeled left ovary on permanent this revealed serous carcinoma.  The operative note reports that the intraoperative diagnosis was borderline tumor, or reviewing the pathology report it states that the intraoperative diagnosis was left ovarian biopsy epithelial neoplasm at least borderline, cannot exclude carcinoma.  The frozen section of the second specimen was the same (epithelial neoplasm at least borderline cannot exclude carcinoma.).  The patient was seen back in the office by Dr. Barrie Dunker on September 06, 2017 and delivered her diagnosis of carcinoma.  At that time she was referred for consultation with gynecologic oncology.  Her past medical history 6 significant for obesity and hypertension.  She has had 2 prior cesarean deliveries.  She does not desire future fertility as she is completed childbearing.  She has no history of abnormal Pap smears and her last Pap was 7 to 8 months ago was normal.  Her past surgical history is significant for 2 cesarean sections and a laparoscopic cholecystectomy.  Her only family history is significant for father with prostate cancer at age 55.  On 10/02/17 she underwent robotic assisted total hysterectomy, RSO, omentectomy, bilateral pelvic lymphadenectomy (PA node dissection not feasible due to body habitus, and no suspicious nodes on preop imaging). Final pathology revealed residual carcinoma on the surface of the uterus (serosa) and separate additional endometrioid carcinoma in endometrium (stage I, thought to be second primary). Metastatic carcinoma (  consistent with serous) in one of 5 left pelvic lymph nodes (right side all benign). Omentum was benign (though atypical cells were identified in peritoneal washings).  CT chest/abd/pelvis on 09/24/17 (prior to staging surgery) showed no evidence of gross metastatic disease.  She was determined to most likely have synchronous  endometrial (stage I) and ovarian (stage IIIC) cancers of endometrioid and high grade serous histologies respectively after pathology and multidisciplinary conference review. Recommendation was for 6 cycles of adjuvant carboplatin and paclitaxel chemotherapy with genetics consultation.  She commenced chemotherapy with Dr Alvy Bimler on 10/26/17.  BRCA and HRD testing negative.   She completed 6 cycles of adjuvant carboplatin paclitaxel chemotherapy on February 12, 2018.  Posttreatment Ca1 25 was normal at 9.3.  She began developing mid right abdominal wall discomfort in November December 2019.  A CT abd/pelvis on March 13 2018 which showed a 12.3 cm left pelvic sidewall cystic structure favoring lymphocele.  This was drained by CT guided drainage on therapy for 2020.  The patient reported her symptoms improved after this time.  Ca1 25 on June 20, 2018 was normal at 8.2.  Interval Hx:   CT abd/pelvis on 11/19/18 showed no residual fluid collections and no recurrence of cancer.  She has new symptoms of GI distress and early satiety.   Current Meds:  Outpatient Encounter Medications as of 02/18/2019  Medication Sig  . amitriptyline (ELAVIL) 10 MG tablet Take 2 tablets (20 mg total) by mouth at bedtime.  . Cholecalciferol (VITAMIN D-3) 125 MCG (5000 UT) TABS Take 10,000 Units by mouth daily.   Marland Kitchen estradiol (ESTRACE) 1 MG tablet Take 1 tablet by mouth once daily  . lisinopril (ZESTRIL) 20 MG tablet Take 10 mg by mouth daily.   . progesterone (PROMETRIUM) 200 MG capsule TAKE 1 CAPSULE BY MOUTH ONCE DAILY AT NIGHT  . [DISCONTINUED] amitriptyline (ELAVIL) 10 MG tablet Patient is to take two (2) tablets a total of 20 mg at bedtime.  . [DISCONTINUED] lisinopril (ZESTRIL) 10 MG tablet Take 1 tablet by mouth once daily   No facility-administered encounter medications on file as of 02/18/2019.    Allergy:  Allergies  Allergen Reactions  . Other Rash    Pork    Social Hx:   Social History    Socioeconomic History  . Marital status: Married    Spouse name: Media planner  . Number of children: 2  . Years of education: Not on file  . Highest education level: Not on file  Occupational History  . Occupation: unemployed  Tobacco Use  . Smoking status: Never Smoker  . Smokeless tobacco: Never Used  Substance and Sexual Activity  . Alcohol use: Never  . Drug use: Never  . Sexual activity: Not Currently    Comment: calendar  Other Topics Concern  . Not on file  Social History Narrative   Married 26 years.Lives with husband and daughter.Homemaker.   Social Determinants of Health   Financial Resource Strain:   . Difficulty of Paying Living Expenses: Not on file  Food Insecurity:   . Worried About Charity fundraiser in the Last Year: Not on file  . Ran Out of Food in the Last Year: Not on file  Transportation Needs:   . Lack of Transportation (Medical): Not on file  . Lack of Transportation (Non-Medical): Not on file  Physical Activity:   . Days of Exercise per Week: Not on file  . Minutes of Exercise per Session: Not on file  Stress:   .  Feeling of Stress : Not on file  Social Connections:   . Frequency of Communication with Friends and Family: Not on file  . Frequency of Social Gatherings with Friends and Family: Not on file  . Attends Religious Services: Not on file  . Active Member of Clubs or Organizations: Not on file  . Attends Archivist Meetings: Not on file  . Marital Status: Not on file  Intimate Partner Violence:   . Fear of Current or Ex-Partner: Not on file  . Emotionally Abused: Not on file  . Physically Abused: Not on file  . Sexually Abused: Not on file    Past Surgical Hx:  Past Surgical History:  Procedure Laterality Date  . CESAREAN SECTION     x2  . CHOLECYSTECTOMY     20 years ago  . COLONOSCOPY N/A 09/12/2018   Procedure: COLONOSCOPY;  Surgeon: Rogene Houston, MD;  [facility];  Service: Endoscopy;  Laterality:  N/A;  200  . IR IMAGING GUIDED PORT INSERTION  10/24/2017  . OMENTECTOMY N/A 10/02/2017   Procedure: OMENTECTOMY;  Surgeon: Everitt Amber, MD;  Location: [facility];  Service: Gynecology;  Laterality: N/A;  . OTHER SURGICAL HISTORY  07/2017   Left ovary removed  . ROBOTIC ASSISTED TOTAL HYSTERECTOMY WITH BILATERAL SALPINGO OOPHERECTOMY Right 10/02/2017   Procedure: XI ROBOTIC ASSISTED TOTAL HYSTERECTOMY WITH RIGHT SALPINGO OOPHORECTOMY;  Surgeon: Everitt Amber, MD;  Location: [facility];  Service: Gynecology;  Laterality: Right;  . ROBOTIC PELVIC AND PARA-AORTIC LYMPH NODE DISSECTION Bilateral 10/02/2017   Procedure: XI ROBOTIC PELVIC LYMPH NODE DISSECTION;  Surgeon: Everitt Amber, MD;  Location: [facility];  Service: Gynecology;  Laterality: Bilateral;    Past Medical Hx:  Past Medical History:  Diagnosis Date  . Abdominal pain    Right mid only with bowel movements  . Blood in urine   . Change in bowel movement    more green color  . Essential hypertension, benign 11/12/2018  . Family history of prostate cancer   . GERD (gastroesophageal reflux disease)   . Headache    after menstrual cycle every month  . History of gallstones   . Hot flashes due to surgical menopause 12/17/2018  . Hypertension   . Neck pain on right side    pulsating  . Numbness and tingling of right leg   . Obesity   . Obesity (BMI 30.0-34.9) 12/17/2018  . Ovarian cancer, left ([place])   . PPD positive   . Skin rash    Chest, forehead, left arm    Past Gynecological History:  C/s x 2. No history of abnormal paps.  Patient's last menstrual period was 09/24/2017.  Family Hx:  Family History  Problem Relation Age of Onset  . Hypertension Mother   . Prostate cancer Father 27       d. 58  . Diabetes Paternal Aunt   . Heart attack Paternal Aunt   . Heart attack Paternal Grandmother 95  . Thyroid disease Other 14    Review of Systems:  Constitutional  Feels well,    ENT Normal appearing ears and nares  bilaterally Skin/Breast  No rash, sores, jaundice, itching, dryness Cardiovascular  No chest pain, shortness of breath, or edema  Pulmonary  No cough or wheeze.  Gastro Intestinal  No nausea, vomitting, or diarrhoea. No bright red blood per rectum, change in bowel movement, or constipation. + abdominal pain for 2 months. Intermittent Genito Urinary  No frequency, urgency, dysuria, no pain,  no bleeding. Musculo Skeletal  No myalgia, arthralgia, joint swelling or pain  Neurologic  No weakness, numbness, change in gait,  Psychology  No depression, anxiety, insomnia.   Vitals:  Blood pressure 112/72, pulse 79, temperature 98.9 F (37.2 C), temperature source Temporal, resp. rate 18, height '5\' 3"'$  (1.6 m), weight 168 lb 1.6 oz (76.2 kg), last menstrual period 09/24/2017, SpO2 100 %.  Physical Exam: WD in NAD Neck  Supple NROM, without any enlargements.  Lymph Node Survey No cervical supraclavicular or inguinal adenopathy Cardiovascular  Pulse normal rate, regularity and rhythm. S1 and S2 normal.  Lungs  Clear to auscultation bilateraly, without wheezes/crackles/rhonchi. Good air movement.  Skin  No rash/lesions/breakdown  Psychiatry  Alert and oriented to person, place, and time  Abdomen  Normoactive bowel sounds, abdomen soft, non-tender and obese without evidence of hernia. Incisions well healed. Back No CVA tenderness Genito Urinary  Vaginal cuff in tact, well healed, no blood, no masses  Rectal  deferred Extremities  No bilateral cyanosis, clubbing or edema.    Thereasa Solo, MD  02/18/2019, 3:14 PM

## 2019-02-18 NOTE — Patient Instructions (Signed)
Dr Denman George has ordered a CT scan to evaluate your abdomen for possible signs of recurrence.  She is a low suspicion that the cancer has come back.  Her office will call you with the results of the scan as well as your blood work today.  If the scan and blood work are reassuring, she will see you back in 3 months for repeated blood work in another examination.

## 2019-02-18 NOTE — Patient Instructions (Signed)

## 2019-02-18 NOTE — Therapy (Signed)
[place], [place], [address] Phone: [phone]   Fax:  [phone]  Occupational Therapy Treatment Reassessment/discharge summary Patient Details  Name: Kristy Franklin MRN: [id number] Date of Birth: 1971-01-09 Referring Provider (OT): Dr. Arther Abbott    Encounter Date: 02/18/2019  OT End of Session - 02/18/19 1100    Visit Number  10    Number of Visits  10    Authorization Type  [employer]    Authorization Time Period  30 visits through 04/20/19    Authorization - Visit Number  10    Authorization - Number of Visits  30    OT Start Time  [phone]   reassess and discharge   OT Stop Time  1019    OT Time Calculation (min)  33 min    Activity Tolerance  Patient tolerated treatment well    Behavior During Therapy  [facility] for tasks assessed/performed       Past Medical History:  Diagnosis Date  . Abdominal pain    Right mid only with bowel movements  . Blood in urine   . Change in bowel movement    more green color  . Essential hypertension, benign 11/12/2018  . Family history of prostate cancer   . GERD (gastroesophageal reflux disease)   . Headache    after menstrual cycle every month  . History of gallstones   . Hot flashes due to surgical menopause 12/17/2018  . Hypertension   . Neck pain on right side    pulsating  . Numbness and tingling of right leg   . Obesity   . Obesity (BMI 30.0-34.9) 12/17/2018  . Ovarian cancer, left ([place])   . PPD positive   . Skin rash    Chest, forehead, left arm    Past Surgical History:  Procedure Laterality Date  . CESAREAN SECTION     x2  . CHOLECYSTECTOMY     20 years ago  . COLONOSCOPY N/A 09/12/2018   Procedure: COLONOSCOPY;  Surgeon: Rogene Houston, MD;  [facility];  Service: Endoscopy;  Laterality: N/A;  200  . IR IMAGING GUIDED PORT INSERTION  10/24/2017  . OMENTECTOMY N/A 10/02/2017   Procedure: OMENTECTOMY;  Surgeon: Everitt Amber, MD;   [facility];  Service: Gynecology;  Laterality: N/A;  . OTHER SURGICAL HISTORY  07/2017   Left ovary removed  . ROBOTIC ASSISTED TOTAL HYSTERECTOMY WITH BILATERAL SALPINGO OOPHERECTOMY Right 10/02/2017   Procedure: XI ROBOTIC ASSISTED TOTAL HYSTERECTOMY WITH RIGHT SALPINGO OOPHORECTOMY;  Surgeon: Everitt Amber, MD;  [facility];  Service: Gynecology;  Laterality: Right;  . ROBOTIC PELVIC AND PARA-AORTIC LYMPH NODE DISSECTION Bilateral 10/02/2017   Procedure: XI ROBOTIC PELVIC LYMPH NODE DISSECTION;  Surgeon: Everitt Amber, MD;  [facility];  Service: Gynecology;  Laterality: Bilateral;    There were no vitals filed for this visit.  Subjective Assessment - 02/18/19 1057    Subjective   S: Yesterday I did have a pain in the front of the shoulder. it was around a 4/10.    Patient is accompanied by:  Interpreter   Miquel Dunn [place] contractor   Currently in Pain?  No/denies         [facility] OT Assessment - 02/18/19 0949      Assessment   Medical Diagnosis  Right Shoulder Pain      Precautions   Precautions  None      ROM / Strength   AROM /  PROM / Strength  AROM;PROM;Strength      Palpation   Palpation comment  trace fascial restrictions palpated in right upper arm, trapezius, and scapularis region.       AROM   Overall AROM Comments  Assessed seated. IR/er abducted    AROM Assessment Site  Shoulder    Right/Left Shoulder  Right    Right Shoulder Flexion  165 Degrees    Right Shoulder ABduction  172 Degrees    Right Shoulder Internal Rotation  83 Degrees    Right Shoulder External Rotation  87 Degrees      PROM   Overall PROM Comments  assessed in supine, external rotation and internal rotation iwth shoulder abducted    PROM Assessment Site  Shoulder    Right/Left Shoulder  Right    Right Shoulder Flexion  180 Degrees   previous: 162   Right Shoulder ABduction  180 Degrees   previous: 175   Right Shoulder Internal Rotation  90 Degrees   previous: same    Right Shoulder External Rotation  90 Degrees   previous: 82     Strength   Overall Strength Comments  assessed in seated position    Strength Assessment Site  Shoulder    Right/Left Shoulder  Right    Right Shoulder Flexion  5/5   previous: 4+/5   Right Shoulder ABduction  5/5   previous: 4+/5   Right Shoulder Internal Rotation  5/5   previous: 4+/5   Right Shoulder External Rotation  5/5   previous: 4+/5              OT Treatments/Exercises (OP) - 02/18/19 1057      Exercises   Exercises  Shoulder      Shoulder Exercises: Supine   Protraction  PROM;5 reps    Horizontal ABduction  PROM;5 reps    External Rotation  PROM;5 reps   abducted   Internal Rotation  PROM;5 reps   abducted   Flexion  PROM;5 reps    ABduction  PROM;5 reps             OT Education - 02/18/19 1058    Education Details  Reviewed progress in therapy and goals. Reviewed HEP. Pt is to continue with scapular theraband exercises and 4 stretches for upper trapezius and internal rotation. Educated patient on self myofascial release technique using tennis ball to decrease pain and trigger point felt during internal rotation.    Person(s) Educated  Patient    Methods  Explanation;Handout;Verbal cues;Demonstration    Comprehension  Returned demonstration;Verbalized understanding       OT Short Term Goals - 02/18/19 0959      OT SHORT TERM GOAL #1   Title  Patient will be educated and independent with HEP for right shoulder for increased use of right arm with functional tasks.    Time  4    Period  Weeks    Status  Achieved    Target Date  02/19/19      OT SHORT TERM GOAL #2   Title  Patient will improve right shoulder A/ROM to WNL for improved independence with daily tasks such as combing hair and fastening bra.    Time  4    Period  Weeks    Status  Achieved      OT SHORT TERM GOAL #3   Title  Patient will improve right shoulder strength to 5/5 in order to lift items into overhead  and carry grocery bags.  Time  4    Period  Weeks    Status  Achieved      OT SHORT TERM GOAL #4   Title  Patient will decrease pain in her right shoulder to 2/10 or better, in order to use right arm as dominant with daily tasks.    Time  4    Period  Weeks    Status  Achieved      OT SHORT TERM GOAL #5   Title  Patient will decrease right shoulder fascial restrictions to trace for improved mobility required for use of right arm as dominant with daily tasks.    Time  4    Period  Weeks    Status  Achieved               Plan - 02/18/19 1100    Clinical Impression Statement  A: Reassessment completed this date. Patient has met all therapy goals. She has full ROM and 5/5 shoulder strength in all ranges. Reports some pain when completing internal rotation when she reaches behind her back; primarily with doffing her bra. Discussed refraining from movement and modifiying how she is doffing her bra for now. Patient verbalized understanding. Discussed refraining from any activity that causes her pain. Pt verbalized understanding. Reviewed HEP amd updated it accordingly.    Body Structure / Function / Physical Skills  ADL;Decreased knowledge of use of DME;Strength;Pain;UE functional use;IADL;ROM;Fascial restriction;Flexibility;Muscle spasms    Plan  P: D/C from OT services with HEP. Pt to follow up with MD as needed.    Consulted and Agree with Plan of Care  Patient       Patient will benefit from skilled therapeutic intervention in order to improve the following deficits and impairments:   Body Structure / Function / Physical Skills: ADL, Decreased knowledge of use of DME, Strength, Pain, UE functional use, IADL, ROM, Fascial restriction, Flexibility, Muscle spasms       Visit Diagnosis: Acute pain of right shoulder  Stiffness of right shoulder, not elsewhere classified  Other symptoms and signs involving the musculoskeletal system    Problem List Patient Active Problem  List   Diagnosis Date Noted  . Obesity (BMI 30.0-34.9) 12/17/2018  . Hot flashes due to surgical menopause 12/17/2018  . Essential hypertension, benign 11/12/2018  . Rectal pain 08/13/2018  . Dysuria 12/10/2017  . Steroid-induced hyperglycemia 11/20/2017  . 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 Arthralgia 11/20/2017  . Chemotherapy-induced nausea 11/20/2017  . Genetic testing 10/26/2017  . Elevated liver enzymes 10/25/2017  . Family history of prostate cancer   . Obesity 10/02/2017  . Urinary urgency 10/02/2017  . Ovarian cancer ([place]) 10/02/2017  . Ovarian cancer on left ([place]) 09/18/2017   OCCUPATIONAL THERAPY DISCHARGE SUMMARY  Visits from Start of Care: 10  Current functional level related to goals / functional outcomes: See above   Remaining deficits: See above   Education / Equipment: See above Plan: Patient agrees to discharge.  Patient goals were met. Patient is being discharged due to meeting the stated rehab goals.  ?????         Ailene Ravel, OTR/L,CBIS  [id number]  02/18/2019, 11:10 AM  [place], [place], [address] Phone: [phone]   Fax:  [phone]  Name: Kristy Franklin MRN: [id number] Date of Birth: Jul 15, 1970

## 2019-02-18 NOTE — Patient Instructions (Signed)
Complete the following stretches daily as needed.  Try to hold each one for 30 seconds.  Complete 2 times.    Scalene Stretch, Sitting   Sit, one hand tucked under hip on side to be stretched, other hand over top of head. Gently pull head to side and backwards. Hold ___ seconds. For more stretch, lean body in direction of head pull. Repeat ___ times per session. Do ___ sessions per day.  Copyright  VHI. All rights reserved.  Side Bend, Standing   Stand, one hand grasping other arm above wrist behind body. Pull down while gently tilting head toward pulling side. Hold ___ seconds. Repeat ___ times per session. Do ___ sessions per day.  Copyright  VHI. All rights reserved.  Levator Scapula Stretch, Sitting   Sit, one hand on same-side shoulder blade, other hand on head. Gently pull head down and away. Hold ___ seconds. Repeat ___ times per session. Do ___ sessions per day.  Internal Rotation Towel Shoulder Stretch  Put towel over unaffected shoulder. Grab towel behind back with affected arm. Pull towel up and forward on unaffected side to achieve a stretch in the affected shoulder.   I

## 2019-02-19 ENCOUNTER — Telehealth: Payer: Self-pay

## 2019-02-19 LAB — CA 125: Cancer Antigen (CA) 125: 6 U/mL (ref 0.0–38.1)

## 2019-02-19 NOTE — Telephone Encounter (Signed)
-----   Message from Dorothyann Gibbs, NP sent at 02/19/2019  8:53 AM EST ----- Can you let her know her CA 125 is normal and stable

## 2019-02-19 NOTE — Telephone Encounter (Signed)
Told Kristy Franklin that her Ca-125 was good and stable at 6.0.  Pt verbalized understanding.

## 2019-02-25 ENCOUNTER — Encounter (HOSPITAL_COMMUNITY): Payer: PRIVATE HEALTH INSURANCE | Admitting: Occupational Therapy

## 2019-02-26 ENCOUNTER — Other Ambulatory Visit: Payer: Self-pay

## 2019-02-26 ENCOUNTER — Ambulatory Visit (HOSPITAL_COMMUNITY)
Admission: RE | Admit: 2019-02-26 | Discharge: 2019-02-26 | Disposition: A | Discharge status: Home / Self Care | Payer: PRIVATE HEALTH INSURANCE | Source: Ambulatory Visit | Attending: Gynecologic Oncology | Admitting: Gynecologic Oncology

## 2019-02-26 ENCOUNTER — Encounter (HOSPITAL_COMMUNITY): Payer: Self-pay

## 2019-02-26 ENCOUNTER — Ambulatory Visit (HOSPITAL_COMMUNITY): Payer: PRIVATE HEALTH INSURANCE

## 2019-02-26 DIAGNOSIS — C562 Malignant neoplasm of left ovary: Secondary | ICD-10-CM | POA: Diagnosis present

## 2019-02-26 MED ORDER — HEPARIN SOD (PORK) LOCK FLUSH 100 UNIT/ML IV SOLN
INTRAVENOUS | Status: AC
  Administered 2019-02-26: 08:00:00 500 [IU] via INTRAVENOUS
  Filled 2019-02-26: qty 5

## 2019-02-26 MED ORDER — SODIUM CHLORIDE (PF) 0.9 % IJ SOLN
INTRAMUSCULAR | Status: AC
  Filled 2019-02-26: qty 50

## 2019-02-26 MED ORDER — HEPARIN SOD (PORK) LOCK FLUSH 100 UNIT/ML IV SOLN: 500.0000 [IU] | Freq: Once | INTRAVENOUS | Status: AC

## 2019-02-26 MED ORDER — IOHEXOL 300 MG/ML  SOLN
100.0000 mL | Freq: Once | INTRAMUSCULAR | Status: AC | PRN
  Administered 2019-02-26: 100 mL via INTRAVENOUS

## 2019-02-27 ENCOUNTER — Encounter (HOSPITAL_COMMUNITY): Payer: PRIVATE HEALTH INSURANCE

## 2019-02-27 ENCOUNTER — Telehealth: Payer: Self-pay

## 2019-02-27 NOTE — Telephone Encounter (Signed)
Noted - will review with patient on the 25th. Thanks!

## 2019-02-27 NOTE — Telephone Encounter (Signed)
Told Ms Pandolfo the scan shows no signs of cancer in her abdomen and pelvis. Slight enlargement of some lymph nodes  In her pelvis but is nonspecific. Will follow. There is some mild thickening vs distension of the of the stomach that may be related to gastritis per Melissa Cross,NP. Pt states that she is experiencing indigestion, burning, and pain in stomach in the last few weeks. Since Ms Botts has an appointment on 03-23-18 with Laurine Blazer PA-C with [facility], will rout this note and CT scan results to Ms Ellin Mayhew to review for appointment. Pt to keep f/u with Dr. Denman George as scheduled in March. Pt verbalized understanding.

## 2019-03-03 ENCOUNTER — Telehealth (INDEPENDENT_AMBULATORY_CARE_PROVIDER_SITE_OTHER): Payer: Self-pay

## 2019-03-03 NOTE — Telephone Encounter (Signed)
She will be in office in morning.

## 2019-03-04 ENCOUNTER — Encounter (INDEPENDENT_AMBULATORY_CARE_PROVIDER_SITE_OTHER): Payer: Self-pay | Admitting: Internal Medicine

## 2019-03-04 ENCOUNTER — Ambulatory Visit (INDEPENDENT_AMBULATORY_CARE_PROVIDER_SITE_OTHER): Payer: 59 | Admitting: Internal Medicine

## 2019-03-04 ENCOUNTER — Other Ambulatory Visit: Payer: Self-pay

## 2019-03-04 VITALS — BP 140/90 | HR 74 | Ht 63.0 in | Wt 168.8 lb

## 2019-03-04 DIAGNOSIS — K2900 Acute gastritis without bleeding: Secondary | ICD-10-CM | POA: Diagnosis not present

## 2019-03-04 MED ORDER — PANTOPRAZOLE SODIUM 40 MG PO TBEC: 40.0000 mg | DELAYED_RELEASE_TABLET | Freq: Every day | ORAL | 3 refills | Status: DC

## 2019-03-04 NOTE — Progress Notes (Signed)
Metrics: Intervention Frequency ACO  Documented Smoking Status Yearly  Screened one or more times in 24 months  Cessation Counseling or  Active cessation medication Past 24 months  Past 24 months   Guideline developer: UpToDate (See UpToDate for funding source) Date Released: 2014       Wellness Office Visit  Subjective:  Patient ID: Kristy Franklin, female    DOB: 07/02/1970  Age: 49 y.o. MRN: [id number]  CC: This lady is here today because a CT scan of the abdomen that was done apparently implied she has some sort of gastric infection. HPI  I have reviewed the CT scan report which shows the presence of mildly thickened stomach lining, consistent with gastritis.  She does describe epigastric discomfort and a burning sensation in this area.  There is no vomiting.  She had one episode of diarrhea 2 weeks ago but nothing after that.  She has no generalized abdomen pain.  She denies any fever.  She tells me that eating or drinking acidic foods or drinks does make it worse. Past Medical History:  Diagnosis Date  . Abdominal pain    Right mid only with bowel movements  . Blood in urine   . Change in bowel movement    more green color  . Essential hypertension, benign 11/12/2018  . Family history of prostate cancer   . GERD (gastroesophageal reflux disease)   . Headache    after menstrual cycle every month  . History of gallstones   . Hot flashes due to surgical menopause 12/17/2018  . Hypertension   . Neck pain on right side    pulsating  . Numbness and tingling of right leg   . Obesity   . Obesity (BMI 30.0-34.9) 12/17/2018  . Ovarian cancer, left ([place])   . PPD positive   . Skin rash    Chest, forehead, left arm      Family History  Problem Relation Age of Onset  . Hypertension Mother   . Prostate cancer Father 35       d. 39  . Diabetes Paternal Aunt   . Heart attack Paternal Aunt   . Heart attack Paternal Grandmother 95  . Thyroid disease Other 84    Social  History   Social History Narrative   Married 26 years.Lives with husband and daughter.Homemaker.   Social History   Tobacco Use  . Smoking status: Never Smoker  . Smokeless tobacco: Never Used  Substance Use Topics  . Alcohol use: Never    Current Meds  Medication Sig  . amitriptyline (ELAVIL) 10 MG tablet Take 2 tablets (20 mg total) by mouth at bedtime.  . Cholecalciferol (VITAMIN D-3) 125 MCG (5000 UT) TABS Take 10,000 Units by mouth daily.   Marland Kitchen estradiol (ESTRACE) 1 MG tablet Take 1 tablet by mouth once daily  . lisinopril (ZESTRIL) 20 MG tablet Take 10 mg by mouth daily.   . progesterone (PROMETRIUM) 200 MG capsule TAKE 1 CAPSULE BY MOUTH ONCE DAILY AT NIGHT      Objective:   Today's Vitals: BP 140/90   Pulse 74   Ht 5\' 3"  (1.6 m)   Wt 168 lb 12.8 oz (76.6 kg)   LMP 09/24/2017   SpO2 99%   BMI 29.90 kg/m  Vitals with BMI 03/04/2019 02/18/2019 01/13/2019  Height 5\' 3"  5\' 3"  5\' 3"   Weight 168 lbs 13 oz 168 lbs 2 oz 161 lbs  BMI 29.91 [id number]  Systolic [id number]  Diastolic 90  72 73  Pulse 74 79 65     Physical Exam   She looks systemically well and does not appear to be in any acute distress.  Blood pressure somewhat elevated today.  No other new physical findings.    Assessment   1. Acute gastritis without hemorrhage, unspecified gastritis type       Tests ordered No orders of the defined types were placed in this encounter.    Plan: 1. I am going to prescribe a PPI, Protonix, to see if this will help her.  She already does have an appointment with gastroenterology in about 3 weeks so she will follow-up with them.   Meds ordered this encounter  Medications  . pantoprazole (PROTONIX) 40 MG tablet    Sig: Take 1 tablet (40 mg total) by mouth daily.    Dispense:  30 tablet    Refill:  3    [place] Toor Luther Parody, MD

## 2019-03-17 ENCOUNTER — Encounter (INDEPENDENT_AMBULATORY_CARE_PROVIDER_SITE_OTHER): Payer: Self-pay

## 2019-03-24 ENCOUNTER — Other Ambulatory Visit: Payer: Self-pay

## 2019-03-24 ENCOUNTER — Ambulatory Visit (INDEPENDENT_AMBULATORY_CARE_PROVIDER_SITE_OTHER): Payer: 59 | Admitting: Gastroenterology

## 2019-03-24 ENCOUNTER — Encounter (INDEPENDENT_AMBULATORY_CARE_PROVIDER_SITE_OTHER): Payer: Self-pay | Admitting: Gastroenterology

## 2019-03-24 VITALS — BP 138/84 | HR 81 | Temp 97.7°F | Ht 63.0 in | Wt 165.8 lb

## 2019-03-24 DIAGNOSIS — K2990 Gastroduodenitis, unspecified, without bleeding: Secondary | ICD-10-CM | POA: Diagnosis not present

## 2019-03-24 DIAGNOSIS — R933 Abnormal findings on diagnostic imaging of other parts of digestive tract: Secondary | ICD-10-CM

## 2019-03-24 DIAGNOSIS — K2970 Gastritis, unspecified, without bleeding: Secondary | ICD-10-CM

## 2019-03-24 MED ORDER — PANTOPRAZOLE SODIUM 40 MG PO TBEC: 40.0000 mg | DELAYED_RELEASE_TABLET | Freq: Every day | ORAL | 3 refills | Status: DC

## 2019-03-24 NOTE — Progress Notes (Signed)
Patient profile: Isaura Leyman is a 49 y.o. female seen for evaluation of rectal pain. Last seen in clinic on 10/2018.  History of Present Illness: Manuela Rosendahl is seen today for referral from primary care of abnormal CT scan.  She developed 2 weeks of significant abdominal burning located in her epigastric area, she had a CT as below.  She was started by PCP on Protonix 40 mg once a day which she is taking over breakfast and feels this has significantly helped symptoms.  Previously she was eating a lot of spicy foods that make the burning worse, she has began avoiding spicy food and this helps.  She does feel she gets full after small amount and then is hungry again.  She has some GERD symptoms that previously were significant but these are also better.  She denies any nausea or vomiting.  Feels that bland food makes the burning better.  States Protonix 40 mg once a day has significantly helped symptoms and feels the burning has resolved for the most part but does continue with some early satiety.  She denies any lower GI complaints.  She takes a stool softener as needed for bowel movements daily.  Usually moves stools daily. She denies any constipation or diarrhea.  No rectal bleeding.  The rectal pain she had 3 months ago at her office visit has resolved  Wt Readings from Last 3 Encounters:  03/24/19 165 lb 12.8 oz (75.2 kg)  03/04/19 168 lb 12.8 oz (76.6 kg)  02/18/19 168 lb 1.6 oz (76.2 kg)     Last Colonoscopy: Colonoscopy 08/2018 -diverticulosis hepatic flexure and sigmoid.  External/internal hemorrhoids, hypertrophied anal papilla  Per notes suspected that rectal pain was nerve pain or spasm   Last Endoscopy: none prior    Past Medical History:  Past Medical History:  Diagnosis Date  . Abdominal pain    Right mid only with bowel movements  . Blood in urine   . Change in bowel movement    more green color  . Essential hypertension, benign 11/12/2018  . Family history of  prostate cancer   . GERD (gastroesophageal reflux disease)   . Headache    after menstrual cycle every month  . History of gallstones   . Hot flashes due to surgical menopause 12/17/2018  . Hypertension   . Neck pain on right side    pulsating  . Numbness and tingling of right leg   . Obesity   . Obesity (BMI 30.0-34.9) 12/17/2018  . Ovarian cancer, left ([place])   . PPD positive   . Skin rash    Chest, forehead, left arm    Problem List: Patient Active Problem List   Diagnosis Date Noted  . Obesity (BMI 30.0-34.9) 12/17/2018  . Hot flashes due to surgical menopause 12/17/2018  . Essential hypertension, benign 11/12/2018  . Rectal pain 08/13/2018  . Dysuria 12/10/2017  . Steroid-induced hyperglycemia 11/20/2017  . 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 Arthralgia 11/20/2017  . Chemotherapy-induced nausea 11/20/2017  . Genetic testing 10/26/2017  . Elevated liver enzymes 10/25/2017  . Family history of prostate cancer   . Obesity 10/02/2017  . Urinary urgency 10/02/2017  . Ovarian cancer ([place]) 10/02/2017  . Ovarian cancer on left [facility]) 09/18/2017    Past Surgical History: Past Surgical History:  Procedure Laterality Date  . CESAREAN SECTION     x2  . CHOLECYSTECTOMY     20 years ago  . COLONOSCOPY N/A 09/12/2018  Procedure: COLONOSCOPY;  Surgeon: Rogene Houston, MD;  [facility];  Service: Endoscopy;  Laterality: N/A;  200  . IR IMAGING GUIDED PORT INSERTION  10/24/2017  . OMENTECTOMY N/A 10/02/2017   Procedure: OMENTECTOMY;  Surgeon: Everitt Amber, MD;  Location: [facility];  Service: Gynecology;  Laterality: N/A;  . OTHER SURGICAL HISTORY  07/2017   Left ovary removed  . ROBOTIC ASSISTED TOTAL HYSTERECTOMY WITH BILATERAL SALPINGO OOPHERECTOMY Right 10/02/2017   Procedure: XI ROBOTIC ASSISTED TOTAL HYSTERECTOMY WITH RIGHT SALPINGO OOPHORECTOMY;  Surgeon: Everitt Amber, MD;  Location: [facility];  Service: Gynecology;  Laterality: Right;  . ROBOTIC  PELVIC AND PARA-AORTIC LYMPH NODE DISSECTION Bilateral 10/02/2017   Procedure: XI ROBOTIC PELVIC LYMPH NODE DISSECTION;  Surgeon: Everitt Amber, MD;  Location: [facility];  Service: Gynecology;  Laterality: Bilateral;    Allergies: Allergies  Allergen Reactions  . Other Rash    Pork      Home Medications:  Current Outpatient Medications:  .  amitriptyline (ELAVIL) 10 MG tablet, Take 2 tablets (20 mg total) by mouth at bedtime., Disp: 60 tablet, Rfl: 0 .  Cholecalciferol (VITAMIN D-3) 125 MCG (5000 UT) TABS, Take 10,000 Units by mouth daily. , Disp: , Rfl:  .  estradiol (ESTRACE) 1 MG tablet, Take 1 tablet by mouth once daily, Disp: 30 tablet, Rfl: 3 .  lisinopril (ZESTRIL) 20 MG tablet, Take 10 mg by mouth daily. , Disp: , Rfl: 1 .  pantoprazole (PROTONIX) 40 MG tablet, Take 1 tablet (40 mg total) by mouth daily., Disp: 30 tablet, Rfl: 3 .  progesterone (PROMETRIUM) 200 MG capsule, TAKE 1 CAPSULE BY MOUTH ONCE DAILY AT NIGHT, Disp: 30 capsule, Rfl: 3   Family History: family history includes Diabetes in her paternal aunt; Heart attack in her paternal aunt; Heart attack (age of onset: 58) in her paternal grandmother; Hypertension in her mother; Prostate cancer (age of onset: 13) in her father; Thyroid disease (age of onset: 33) in an other family member.    Social History:   reports that she has never smoked. She has never used smokeless tobacco. She reports that she does not drink alcohol or use drugs.   Review of Systems: Constitutional: Denies weight loss/weight gain  Eyes: No changes in vision. ENT: No oral lesions, sore throat.  GI: see HPI.  Heme/Lymph: No easy bruising.  CV: No chest pain.  GU: No hematuria.  Integumentary: No rashes.  Neuro: No headaches.  Psych: No depression/anxiety.  Endocrine: No heat/cold intolerance.  Allergic/Immunologic: No urticaria.  Resp: No cough, SOB.  Musculoskeletal: No joint swelling.    Physical Examination: BP 138/84 (BP Location: Right  Arm, Patient Position: Sitting, Cuff Size: Large)   Pulse 81   Temp 97.7 F (36.5 C) (Temporal)   Ht 5\' 3"  (1.6 m)   Wt 165 lb 12.8 oz (75.2 kg)   LMP 09/24/2017   BMI 29.37 kg/m  Gen: NAD, alert and oriented x 4 HEENT: PEERLA, EOMI, Neck: supple, no JVD Chest: CTA bilaterally, no wheezes, crackles, or other adventitious sounds CV: RRR, no m/g/c/r Abd: soft, NT, ND, +BS in all four quadrants; no HSM, guarding, ridigity, or rebound tenderness Ext: no edema, well perfused with 2+ pulses, Skin: no rash or lesions noted on observed skin Lymph: no noted LAD  Data Reviewed:  01/2019 labs - CA 125, BMP normal.   CT Scan 02/26/2019--IMPRESSION: 1. No signs of disease in the abdomen or pelvis. 2. Slight enlargement of external iliac lymph nodes nonspecific,  attention on follow-up. 3. Under distension versus mild thickening of the stomach, correlate with any clinical signs of gastritis. Mildly patulous appearance of the duodenal bulb also nonspecific, attention on follow-up. 4. Colonic diverticulosis 5. Normal appendix. 6. Background mild hepatic steatosis suspected.     Assessment/Plan:  1.  Epigastric pain and abnormal CT scan-suspect that symptoms are due to gastritis.  She does not use NSAIDs and symptoms are better on Protonix 40 mg once a day.  We discussed given she does still have some milder symptoms increasing to twice daily dosing but she would like to continue once a day.  We discussed avoiding trigger foods in diet modifications. Can bx for H.pylori on EGD, will hold off on stool antigen test today given currently on PPI. She has never had an upper endoscopy will schedule upper endoscopy for evaluation I will check a CBC to ensure she is not anemic.   Patient denies CP, SOB, and use of blood thinners. I discussed the risks and benefits of procedure including bleeding, perforation, infection, missed lesions, medication reactions and possible hospitalization or surgery if  complications. All questions answered.  She denies any issues previously with sedation.  EGD order to be placed when COVID elective procedure restrictions lifted.   Ms. Kleinsasser is a 49 y.o. female    Bryan was seen today for follow-up.  Diagnoses and all orders for this visit:  Abnormal CT scan, gastrointestinal tract -     CBC  Gastritis and gastroduodenitis -     CBC  Other orders -     pantoprazole (PROTONIX) 40 MG tablet; Take 1 tablet (40 mg total) by mouth daily.   1.  Abnormal CT/epigastric burning-CT as above concerning for gastritis and symptoms correlate.   Recommendations:   I personally performed the service, non-incident to. (WP)  Laurine Blazer, [facility]

## 2019-03-24 NOTE — Patient Instructions (Addendum)
We are continuing protonix 40mg  once a day - we can increase dose if needed. We are checking blood count today w/ a CBC. We will call to set up upper endoscopy when able.

## 2019-03-25 ENCOUNTER — Other Ambulatory Visit (INDEPENDENT_AMBULATORY_CARE_PROVIDER_SITE_OTHER): Payer: Self-pay | Admitting: Internal Medicine

## 2019-03-26 LAB — CBC
HCT: 40.2 % (ref 35.0–45.0)
Hemoglobin: 13.1 g/dL (ref 11.7–15.5)
MCH: 27.9 pg (ref 27.0–33.0)
MCHC: 32.6 g/dL (ref 32.0–36.0)
MCV: 85.7 fL (ref 80.0–100.0)
MPV: 10.9 fL (ref 7.5–12.5)
Platelets: 333 10*3/uL (ref 140–400)
RBC: 4.69 10*6/uL (ref 3.80–5.10)
RDW: 12.5 % (ref 11.0–15.0)
WBC: 7.8 10*3/uL (ref 3.8–10.8)

## 2019-04-08 ENCOUNTER — Other Ambulatory Visit (INDEPENDENT_AMBULATORY_CARE_PROVIDER_SITE_OTHER): Payer: Self-pay | Admitting: *Deleted

## 2019-04-08 DIAGNOSIS — R933 Abnormal findings on diagnostic imaging of other parts of digestive tract: Secondary | ICD-10-CM

## 2019-04-08 DIAGNOSIS — R1013 Epigastric pain: Secondary | ICD-10-CM

## 2019-04-10 ENCOUNTER — Other Ambulatory Visit (INDEPENDENT_AMBULATORY_CARE_PROVIDER_SITE_OTHER): Payer: Self-pay | Admitting: *Deleted

## 2019-04-22 ENCOUNTER — Encounter (INDEPENDENT_AMBULATORY_CARE_PROVIDER_SITE_OTHER): Payer: 59 | Admitting: Internal Medicine

## 2019-04-22 ENCOUNTER — Other Ambulatory Visit: Payer: Self-pay

## 2019-04-22 ENCOUNTER — Other Ambulatory Visit (HOSPITAL_COMMUNITY)
Admission: RE | Admit: 2019-04-22 | Discharge: 2019-04-22 | Disposition: A | Discharge status: Home / Self Care | Payer: 59 | Source: Ambulatory Visit | Attending: Internal Medicine | Admitting: Internal Medicine

## 2019-04-22 DIAGNOSIS — Z01812 Encounter for preprocedural laboratory examination: Secondary | ICD-10-CM | POA: Insufficient documentation

## 2019-04-22 DIAGNOSIS — Z20822 Contact with and (suspected) exposure to covid-19: Secondary | ICD-10-CM | POA: Diagnosis not present

## 2019-04-22 LAB — SARS CORONAVIRUS 2 (TAT 6-24 HRS): SARS Coronavirus 2: NEGATIVE

## 2019-04-24 ENCOUNTER — Encounter (HOSPITAL_COMMUNITY): Payer: Self-pay | Admitting: Internal Medicine

## 2019-04-24 ENCOUNTER — Ambulatory Visit (HOSPITAL_COMMUNITY)
Admission: RE | Admit: 2019-04-24 | Discharge: 2019-04-24 | Disposition: A | Discharge status: Home / Self Care | Payer: 59 | Attending: Internal Medicine | Admitting: Internal Medicine

## 2019-04-24 ENCOUNTER — Encounter (HOSPITAL_COMMUNITY)
Admission: RE | Disposition: A | Discharge status: Home / Self Care | Payer: Self-pay | Source: Home / Self Care | Attending: Internal Medicine

## 2019-04-24 ENCOUNTER — Other Ambulatory Visit: Payer: Self-pay

## 2019-04-24 DIAGNOSIS — I10 Essential (primary) hypertension: Secondary | ICD-10-CM | POA: Diagnosis not present

## 2019-04-24 DIAGNOSIS — Z79899 Other long term (current) drug therapy: Secondary | ICD-10-CM | POA: Insufficient documentation

## 2019-04-24 DIAGNOSIS — R1013 Epigastric pain: Secondary | ICD-10-CM

## 2019-04-24 DIAGNOSIS — K2950 Unspecified chronic gastritis without bleeding: Secondary | ICD-10-CM | POA: Diagnosis not present

## 2019-04-24 DIAGNOSIS — K219 Gastro-esophageal reflux disease without esophagitis: Secondary | ICD-10-CM | POA: Diagnosis not present

## 2019-04-24 DIAGNOSIS — Z79890 Hormone replacement therapy (postmenopausal): Secondary | ICD-10-CM | POA: Insufficient documentation

## 2019-04-24 DIAGNOSIS — R933 Abnormal findings on diagnostic imaging of other parts of digestive tract: Secondary | ICD-10-CM | POA: Insufficient documentation

## 2019-04-24 DIAGNOSIS — K317 Polyp of stomach and duodenum: Secondary | ICD-10-CM | POA: Insufficient documentation

## 2019-04-24 DIAGNOSIS — Z8543 Personal history of malignant neoplasm of ovary: Secondary | ICD-10-CM | POA: Insufficient documentation

## 2019-04-24 HISTORY — PX: BIOPSY: SHX5522

## 2019-04-24 HISTORY — PX: POLYPECTOMY: SHX5525

## 2019-04-24 HISTORY — PX: ESOPHAGOGASTRODUODENOSCOPY: SHX5428

## 2019-04-24 SURGERY — EGD (ESOPHAGOGASTRODUODENOSCOPY)
Anesthesia: Moderate Sedation

## 2019-04-24 MED ORDER — MIDAZOLAM HCL 5 MG/5ML IJ SOLN
INTRAMUSCULAR | Status: DC | PRN
  Administered 2019-04-24: 2 mg via INTRAVENOUS
  Administered 2019-04-24: 1 mg via INTRAVENOUS
  Administered 2019-04-24 (×3): 2 mg via INTRAVENOUS

## 2019-04-24 MED ORDER — MIDAZOLAM HCL 5 MG/5ML IJ SOLN
INTRAMUSCULAR | Status: AC
  Filled 2019-04-24: qty 10

## 2019-04-24 MED ORDER — SODIUM CHLORIDE 0.9 % IV SOLN: INTRAVENOUS | Status: DC

## 2019-04-24 MED ORDER — MEPERIDINE HCL 50 MG/ML IJ SOLN
INTRAMUSCULAR | Status: AC
  Filled 2019-04-24: qty 1

## 2019-04-24 MED ORDER — MEPERIDINE HCL 50 MG/ML IJ SOLN
INTRAMUSCULAR | Status: DC | PRN
  Administered 2019-04-24 (×2): 25 mg via INTRAVENOUS

## 2019-04-24 MED ORDER — LIDOCAINE VISCOUS HCL 2 % MT SOLN
OROMUCOSAL | Status: DC | PRN
  Administered 2019-04-24: 4 mL via OROMUCOSAL

## 2019-04-24 MED ORDER — LIDOCAINE VISCOUS HCL 2 % MT SOLN
OROMUCOSAL | Status: AC
  Filled 2019-04-24: qty 15

## 2019-04-24 MED ORDER — STERILE WATER FOR IRRIGATION IR SOLN
Status: DC | PRN
  Administered 2019-04-24: 1.5 mL

## 2019-04-24 MED ORDER — DICYCLOMINE HCL 10 MG PO CAPS: 10.0000 mg | ORAL_CAPSULE | Freq: Three times a day (TID) | ORAL | 1 refills | Status: DC

## 2019-04-24 NOTE — H&P (Signed)
Kristy Franklin is an 49 y.o. female.   Chief Complaint: Patient is here for esophagogastroduodenoscopy. HPI: Patient patient is 49 year old female who presents with over 6-week history of epigastric pain as well as burning pain in chest.  She had abdominal pelvic CT which revealed gastric wall thickening.  Patient was begun on pantoprazole.  She feels maybe 50% better she is still having the symptoms.  She may also have pain in the left side.  She denies nausea vomiting dysphagia melena or rectal bleeding.  She does not take OTC NSAIDs.  She does not smoke cigarettes or drink alcohol.  She was drinking 2 cups of coffee but now she is only drinking 1 cup.  She is status post cholecystectomy for cholelithiasis.  Past Medical History:  Diagnosis Date  . Abdominal pain    Right mid only with bowel movements  . Blood in urine   .       Marland Kitchen Essential hypertension, benign 11/12/2018      . GERD (gastroesophageal reflux disease)   . Headache    after menstrual cycle every month      . Hot flashes due to surgical menopause 12/17/2018  . Hypertension   . Neck pain on right side    pulsating  . Numbness and tingling of right leg   . Obesity   . Obesity (BMI 30.0-34.9) 12/17/2018  . Ovarian cancer, left ([place])   . PPD positive   .         Past Surgical History:  Procedure Laterality Date  . CESAREAN SECTION     x2  . CHOLECYSTECTOMY     20 years ago  . COLONOSCOPY N/A 09/12/2018   Procedure: COLONOSCOPY;  Surgeon: Rogene Houston, MD;  [facility];  Service: Endoscopy;  Laterality: N/A;  200  . IR IMAGING GUIDED PORT INSERTION  10/24/2017  . OMENTECTOMY N/A 10/02/2017   Procedure: OMENTECTOMY;  Surgeon: Everitt Amber, MD;  [facility];  Service: Gynecology;  Laterality: N/A;  . OTHER SURGICAL HISTORY  07/2017   Left ovary removed  . ROBOTIC ASSISTED TOTAL HYSTERECTOMY WITH BILATERAL SALPINGO OOPHERECTOMY Right 10/02/2017   Procedure: XI ROBOTIC ASSISTED TOTAL HYSTERECTOMY  WITH RIGHT SALPINGO OOPHORECTOMY;  Surgeon: Everitt Amber, MD;  [facility];  Service: Gynecology;  Laterality: Right;  . ROBOTIC PELVIC AND PARA-AORTIC LYMPH NODE DISSECTION Bilateral 10/02/2017   Procedure: XI ROBOTIC PELVIC LYMPH NODE DISSECTION;  Surgeon: Everitt Amber, MD;  [facility];  Service: Gynecology;  Laterality: Bilateral;    Family History  Problem Relation Age of Onset  . Hypertension Mother   . Prostate cancer Father 4       d. 71  . Diabetes Paternal Aunt   . Heart attack Paternal Aunt   . Heart attack Paternal Grandmother 95  . Thyroid disease Other 14   Social History:  reports that she has never smoked. She has never used smokeless tobacco. She reports that she does not drink alcohol or use drugs.  Allergies:  Allergies  Allergen Reactions  . Other Rash    Pork    Medications Prior to Admission  Medication Sig Dispense Refill  . amitriptyline (ELAVIL) 10 MG tablet Take 2 tablets (20 mg total) by mouth at bedtime. 60 tablet 0  . Cholecalciferol (VITAMIN D-3) 125 MCG (5000 UT) TABS Take 10,000 Units by mouth daily.     Marland Kitchen estradiol (ESTRACE) 1 MG tablet Take 1 tablet by mouth once daily (Patient taking differently: Take 0.5  mg by mouth daily. ) 30 tablet 3  . lisinopril (ZESTRIL) 10 MG tablet Take 1 tablet by mouth once daily (Patient taking differently: Take 10 mg by mouth at bedtime. ) 90 tablet 0  . pantoprazole (PROTONIX) 40 MG tablet Take 1 tablet (40 mg total) by mouth daily. 30 tablet 3  . progesterone (PROMETRIUM) 200 MG capsule TAKE 1 CAPSULE BY MOUTH ONCE DAILY AT NIGHT (Patient taking differently: Take 200 mg by mouth at bedtime. ) 30 capsule 3    No results found for this or any previous visit (from the past 48 hour(s)). No results found.  Review of Systems  Blood pressure 130/65, pulse 66, temperature 98 F (36.7 C), temperature source Oral, resp. rate 20, height 5\' 3"  (1.6 m), weight 77.1 kg, last menstrual period 09/24/2017, SpO2 99  %. Physical Exam  Constitutional: She appears well-developed and well-nourished.  HENT:  Mouth/Throat: Oropharynx is clear and moist.  Eyes: Conjunctivae are normal. No scleral icterus.  Neck: No thyromegaly present.  Cardiovascular: Normal rate, regular rhythm and normal heart sounds.  No murmur heard. Respiratory: Effort normal and breath sounds normal.  GI:  Abdomen is full.  It is symmetrical.  Abdomen is soft with mild midepigastric tenderness.  No organomegaly or masses.  Musculoskeletal:        General: No edema.  Lymphadenopathy:    She has no cervical adenopathy.  Neurological: She is alert.  Skin: Skin is warm and dry.     Assessment/Plan Epigastric pain. Abnormal CT gastric wall thickening. Diagnostic esophagogastroduodenoscopy.  Hildred Laser, MD 04/24/2019, 1:28 PM

## 2019-04-24 NOTE — Discharge Instructions (Signed)
No aspirin or NSAIDs for 5 days. Resume usual medications as before. Dicyclomine 10 mg by mouth 30 minutes before each meal. Resume usual diet. No driving for 24 hours. Physician will call with biopsy results.   Upper Endoscopy, Adult, Care After This sheet gives you information about how to care for yourself after your procedure. Your health care provider may also give you more specific instructions. If you have problems or questions, contact your health care provider. What can I expect after the procedure? After the procedure, it is common to have:  A sore throat.  Mild stomach pain or discomfort.  Bloating.  Nausea. Follow these instructions at home:   Follow instructions from your health care provider about what to eat or drink after your procedure.  Return to your normal activities as told by your health care provider. Ask your health care provider what activities are safe for you.  Take over-the-counter and prescription medicines only as told by your health care provider.  Do not drive for 24 hours if you were given a sedative during your procedure.  Keep all follow-up visits as told by your health care provider. This is important. Contact a health care provider if you have:  A sore throat that lasts longer than one day.  Trouble swallowing. Get help right away if:  You vomit blood or your vomit looks like coffee grounds.  You have: ? A fever. ? Bloody, black, or tarry stools. ? A severe sore throat or you cannot swallow. ? Difficulty breathing. ? Severe pain in your chest or abdomen. Summary  After the procedure, it is common to have a sore throat, mild stomach discomfort, bloating, and nausea.  Do not drive for 24 hours if you were given a sedative during the procedure.  Follow instructions from your health care provider about what to eat or drink after your procedure.  Return to your normal activities as told by your health care provider. This  information is not intended to replace advice given to you by your health care provider. Make sure you discuss any questions you have with your health care provider. Document Revised: 08/07/2017 Document Reviewed: 07/16/2017 Elsevier Patient Education  [place].  Gastric Polyps A gastric polyp, also called a stomach polyp, is a growth on the lining of the stomach. Most polyps are not dangerous, but some can be harmful because of their size, location, or type. Polyps that can become harmful include:  Large polyps. These can turn into sores (ulcers). Ulcers can lead to stomach bleeding.  Polyps that block food from moving from the stomach to the small intestine (gastric outlet obstruction).  A type of polyp called an adenoma. This type of polyp can become cancerous. What are the causes? Gastric polyps form when the lining of the stomach gets inflamed or damaged. Stomach inflammation and damage may be caused by:  A long-lasting stomach condition, such as gastritis.  Certain medicines used to reduce stomach acid.  An inherited condition called familial adenomatous polyposis. What are the signs or symptoms? Usually, this condition does not cause any symptoms. If you do have symptoms, they may include:  Pain or tenderness in the abdomen.  Nausea.  Trouble eating or swallowing.  Blood in the stool.  Anemia. How is this diagnosed? Gastric polyps are diagnosed with:  A medical procedure called endoscopy.  A lab test in which a part of the polyp is examined. This test is done with a sample of polyp tissue (biopsy) taken during  an endoscopy. How is this treated? Treatment depends on the type, location, and size of the polyps. Treatment may involve:  Having the polyps checked regularly with an endoscopy.  Having the polyps removed with an endoscopy. This may be done if the polyps are harmful or can become harmful. Removing a polyp often prevents problems from  developing.  Having the polyps removed with a surgery called a partial gastrectomy. This may be done in rare cases to remove very large polyps.  Treating the underlying condition that caused the polyps. Follow these instructions at home:  Take over-the-counter and prescription medicines only as told by your health care provider.  Keep all follow-up visits as told by your health care provider. This is important. Contact a health care provider if:  You develop new symptoms.  Your symptoms get worse. Get help right away if:  You vomit blood.  You have severe abdominal pain.  You cannot eat or drink.  You have blood in your stool. This information is not intended to replace advice given to you by your health care provider. Make sure you discuss any questions you have with your health care provider. Document Revised: 01/26/2017 Document Reviewed: 02/28/2015 Elsevier Patient Education  2020 [place], Adult     A hernia happens when tissue inside your body pushes out through a weak spot in your belly muscles (abdominal wall). This makes a round lump (bulge). The lump may be:  In a scar from surgery that was done in your belly (incisional hernia).  Near your belly button (umbilical hernia).  In your groin (inguinal hernia). Your groin is the area where your leg meets your lower belly (abdomen). This kind of hernia could also be: ? In your scrotum, if you are female. ? In folds of skin around your vagina, if you are female.  In your upper thigh (femoral hernia).  Inside your belly (hiatal hernia). This happens when your stomach slides above the muscle between your belly and your chest (diaphragm). If your hernia is small and it does not cause pain, you may not need treatment. If your hernia is large or it causes pain, you may need surgery. Follow these instructions at home: Activity  Avoid stretching or overusing (straining) the muscles near your hernia. Straining  can happen when you: ? Lift something heavy. ? Poop (have a bowel movement).  Do not lift anything that is heavier than 10 lb (4.5 kg), or the limit that you are told, until your doctor says that it is safe.  Use the strength of your legs when you lift something heavy. Do not use only your back muscles to lift. General instructions  Do these things if told by your doctor so you do not have trouble pooping (constipation): ? Drink enough fluid to keep your pee (urine) pale yellow. ? Eat foods that are high in fiber. These include fresh fruits and vegetables, whole grains, and beans. ? Limit foods that are high in fat and processed sugars. These include foods that are fried or sweet. ? Take medicine for trouble pooping.  When you cough, try to cough gently.  You may try to push your hernia in by very gently pressing on it when you are lying down. Do not try to force the bulge back in if it will not push in easily.  If you are overweight, work with your doctor to lose weight safely.  Do not use any products that have nicotine or tobacco in them. These  include cigarettes and e-cigarettes. If you need help quitting, ask your doctor.  If you will be having surgery (hernia repair), watch your hernia for changes in shape, size, or color. Tell your doctor if you see any changes.  Take over-the-counter and prescription medicines only as told by your doctor.  Keep all follow-up visits as told by your doctor. Contact a doctor if:  You get new pain, swelling, or redness near your hernia.  You poop fewer times in a week than normal.  You have trouble pooping.  You have poop (stool) that is more dry than normal.  You have poop that is harder or larger than normal. Get help right away if:  You have a fever.  You have belly pain that gets worse.  You feel sick to your stomach (nauseous).  You throw up (vomit).  Your hernia cannot be pushed in by very gently pressing on it when you are  lying down. Do not try to force the bulge back in if it will not push in easily.  Your hernia: ? Changes in shape or size. ? Changes color. ? Feels hard or it hurts when you touch it. These symptoms may represent a serious problem that is an emergency. Do not wait to see if the symptoms will go away. Get medical help right away. Call your local emergency services (911 in the [place] Summary  A hernia happens when tissue inside your body pushes out through a weak spot in the belly muscles. This creates a bulge.  If your hernia is small and it does not hurt, you may not need treatment. If your hernia is large or it hurts, you may need surgery.  If you will be having surgery, watch your hernia for changes in shape, size, or color. Tell your doctor about any changes. This information is not intended to replace advice given to you by your health care provider. Make sure you discuss any questions you have with your health care provider. Document Revised: 06/06/2018 Document Reviewed: 11/15/2016 Elsevier Patient Education  [place].

## 2019-04-24 NOTE — Op Note (Signed)
[facility] Patient Name: Kristy Franklin Procedure Date: 04/24/2019 12:12 PM MRN: [id number] Date of Birth: October 10, 1970 Attending MD: Hildred Laser , MD CSN: [id number] Age: 49 Admit Type: Outpatient Procedure:                Upper GI endoscopy Indications:              Epigastric abdominal pain, Abnormal CT of the GI                            tract Providers:                Hildred Laser, MD, Otis Peak B. Sharon Seller, RN, Aram Candela Referring MD:             Doree Albee, MD Medicines:                Lidocaine spray, Meperidine 50 mg IV, Midazolam 9                            mg IV Complications:            No immediate complications. Estimated Blood Loss:     Estimated blood loss was minimal. Procedure:                Pre-Anesthesia Assessment:                           - Prior to the procedure, a History and Physical                            was performed, and patient medications and                            allergies were reviewed. The patient's tolerance of                            previous anesthesia was also reviewed. The risks                            and benefits of the procedure and the sedation                            options and risks were discussed with the patient.                            All questions were answered, and informed consent                            was obtained. Prior Anticoagulants: The patient has                            taken no previous anticoagulant or antiplatelet  agents. ASA Grade Assessment: II - A patient with                            mild systemic disease. After reviewing the risks                            and benefits, the patient was deemed in                            satisfactory condition to undergo the procedure.                           After obtaining informed consent, the endoscope was                            passed under direct vision. Throughout  the                            procedure, the patient's blood pressure, pulse, and                            oxygen saturations were monitored continuously. The                            [id number]-[id number]) scope was introduced through the                            mouth, and advanced to the second part of duodenum.                            The upper GI endoscopy was accomplished without                            difficulty. The patient tolerated the procedure                            well. Scope In: 1:39:21 PM Scope Out: 1:52:44 PM Total Procedure Duration: 0 hours 13 minutes 23 seconds  Findings:      The examined esophagus was normal.      The Z-line was regular and was found 32 cm from the incisors.      Multiple 4 to 8 mm pedunculated and sessile polyps with no bleeding and       no stigmata of recent bleeding were found in the gastric body. Five       polyps were hot snared. The pathology specimen was placed into Bottle       Number 2.      The exam of the stomach was otherwise normal.      Biopsies were taken with a cold forceps in the gastric antrum for       histology.      The duodenal bulb and second portion of the duodenum were normal. Impression:               - Normal esophagus.                           -  Z-line regular, 32 cm from the incisors.                           - Multiple gastric polyps. Five polyps were hot                            snared.                           - Normal duodenal bulb and second portion of the                            duodenum.                           - Biopsies were taken with a cold forceps for                            histology in the gastric antrum. Moderate Sedation:      Moderate (conscious) sedation was administered by the endoscopy nurse       and supervised by the endoscopist. The following parameters were       monitored: oxygen saturation, heart rate, blood pressure, CO2       capnography and response to care.  Total physician intraservice time was       17 minutes. Recommendation:           - Patient has a contact number available for                            emergencies. The signs and symptoms of potential                            delayed complications were discussed with the                            patient. Return to normal activities tomorrow.                            Written discharge instructions were provided to the                            patient.                           - Resume previous diet today.                           - Continue present medications.                           - dicyclomine 10 mg po ac.                           - No aspirin, ibuprofen, naproxen, or other  non-steroidal anti-inflammatory drugs for 5 days.                           - Await pathology results. Procedure Code(s):        --- Professional ---                           [phone], Esophagogastroduodenoscopy, flexible,                            transoral; with biopsy, single or multiple                           G0500, Moderate sedation services provided by the                            same physician or other qualified health care                            professional performing a gastrointestinal                            endoscopic service that sedation supports,                            requiring the presence of an independent trained                            observer to assist in the monitoring of the                            patient's level of consciousness and physiological                            status; initial 15 minutes of intra-service time;                            patient age 73 years or older (additional time may                            be reported with [phone], as appropriate) Diagnosis Code(s):        --- Professional ---                           K31.7, Polyp of stomach and duodenum                           R10.13, Epigastric pain                            R93.3, Abnormal findings on diagnostic imaging of                            other parts of digestive tract CPT copyright 2019 [employer]. All rights reserved. The codes documented in this report are  preliminary and upon coder review may  be revised to meet current compliance requirements. Hildred Laser, MD Hildred Laser, MD 04/24/2019 2:05:12 PM This report has been signed electronically. Number of Addenda: 0

## 2019-04-28 ENCOUNTER — Other Ambulatory Visit: Payer: Self-pay

## 2019-04-28 LAB — SURGICAL PATHOLOGY

## 2019-05-21 ENCOUNTER — Inpatient Hospital Stay: Payer: 59 | Attending: Gynecologic Oncology | Admitting: Gynecologic Oncology

## 2019-05-21 ENCOUNTER — Other Ambulatory Visit: Payer: Self-pay

## 2019-05-21 ENCOUNTER — Encounter: Payer: Self-pay | Admitting: Gynecologic Oncology

## 2019-05-21 ENCOUNTER — Inpatient Hospital Stay: Payer: 59

## 2019-05-21 VITALS — BP 120/43 | HR 81 | Temp 98.5°F | Resp 17 | Ht 63.0 in | Wt 172.8 lb

## 2019-05-21 DIAGNOSIS — C562 Malignant neoplasm of left ovary: Secondary | ICD-10-CM

## 2019-05-21 DIAGNOSIS — I10 Essential (primary) hypertension: Secondary | ICD-10-CM | POA: Insufficient documentation

## 2019-05-21 DIAGNOSIS — Z8349 Family history of other endocrine, nutritional and metabolic diseases: Secondary | ICD-10-CM | POA: Diagnosis not present

## 2019-05-21 DIAGNOSIS — Z8249 Family history of ischemic heart disease and other diseases of the circulatory system: Secondary | ICD-10-CM | POA: Diagnosis not present

## 2019-05-21 DIAGNOSIS — Z90710 Acquired absence of both cervix and uterus: Secondary | ICD-10-CM | POA: Diagnosis not present

## 2019-05-21 DIAGNOSIS — Z833 Family history of diabetes mellitus: Secondary | ICD-10-CM | POA: Diagnosis not present

## 2019-05-21 DIAGNOSIS — E669 Obesity, unspecified: Secondary | ICD-10-CM | POA: Diagnosis not present

## 2019-05-21 DIAGNOSIS — Z560 Unemployment, unspecified: Secondary | ICD-10-CM | POA: Insufficient documentation

## 2019-05-21 DIAGNOSIS — Z90721 Acquired absence of ovaries, unilateral: Secondary | ICD-10-CM | POA: Diagnosis not present

## 2019-05-21 DIAGNOSIS — C541 Malignant neoplasm of endometrium: Secondary | ICD-10-CM | POA: Diagnosis not present

## 2019-05-21 DIAGNOSIS — Z8042 Family history of malignant neoplasm of prostate: Secondary | ICD-10-CM | POA: Insufficient documentation

## 2019-05-21 DIAGNOSIS — Z79899 Other long term (current) drug therapy: Secondary | ICD-10-CM | POA: Insufficient documentation

## 2019-05-21 MED ORDER — HEPARIN SOD (PORK) LOCK FLUSH 100 UNIT/ML IV SOLN
500.0000 [IU] | Freq: Once | INTRAVENOUS | Status: AC
  Administered 2019-05-21: 500 [IU]
  Filled 2019-05-21: qty 5

## 2019-05-21 MED ORDER — SODIUM CHLORIDE 0.9% FLUSH
10.0000 mL | Freq: Once | INTRAVENOUS | Status: AC
  Administered 2019-05-21: 10 mL
  Filled 2019-05-21: qty 10

## 2019-05-21 NOTE — Progress Notes (Signed)
Follow-up Note: Gyn-Onc  Consult was intially requested by Dr. Barrie Dunker for the evaluation of Kristy Franklin 49 y.o. female  CC:  Chief Complaint  Patient presents with  . Ovarian cancer on left [facility])    Follow up   Seen with translator (spanish)  Assessment/Plan:  [name]  is a 49 y.o.  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 BRCA negative.  S/p completion of chemotherapy with 6 cycles adjuvant carboplatin and paclitaxel in December, 2019.   Will check with CT abd/pelvis in 6 months to follow up the mildly enlarged pelvic nodes.    Will see her back at 3 monthly intervals with CA 125 until December, 2019.   HPI: [name] is a 49 year old P2 who is seen in consultation at the request of Dr Barrie Dunker for high grade serous ovarian cancer, clinical stage IC.  The patient reports a history of having seen the emergency room in [place] in May or June 2019 when she developed left lower quadrant pain.  During the ER visit an ultrasound performed which identified an adnexal mass on the left, with no signs of torsion.  She was then seen in the offices of Dr. Barrie Dunker in June 2019 and a plan was made to proceed with surgical removal of the adnexa.  On August 23, 2017 she was taken to the operating room for a laparoscopic left salpingo-oophorectomy.  Review of the operative note suggest that intraoperative findings were significant for a 6 cm left adnexal mass that was stuck in the cul-de-sac with some paraovarian adhesions noted.  It did not appear to be malignant in appearance to the surgeons at the time of surgery.  The upper abdomen, diaphragms, and omentum were commented on as looking normal.  There was no ascites.  The left tube and ovary was removed, however there was some fragmentation of the specimen during removal, and therefore the left tube and ovary were sent as 2  specimens.  The first specimen is labeled ovary biopsy for frozen section which revealed serous carcinoma and the second specimen is labeled left ovary on permanent this revealed serous carcinoma.  The operative note reports that the intraoperative diagnosis was borderline tumor, or reviewing the pathology report it states that the intraoperative diagnosis was left ovarian biopsy epithelial neoplasm at least borderline, cannot exclude carcinoma.  The frozen section of the second specimen was the same (epithelial neoplasm at least borderline cannot exclude carcinoma.).  The patient was seen back in the office by Dr. Barrie Dunker on September 06, 2017 and delivered her diagnosis of carcinoma.  At that time she was referred for consultation with gynecologic oncology.  Her past medical history 6 significant for obesity and hypertension.  She has had 2 prior cesarean deliveries.  She does not desire future fertility as she is completed childbearing.  She has no history of abnormal Pap smears and her last Pap was 7 to 8 months ago was normal.  Her past surgical history is significant for 2 cesarean sections and a laparoscopic cholecystectomy.  Her only family history is significant for father with prostate cancer at age 87.  On 10/02/17 she underwent robotic assisted total hysterectomy, RSO, omentectomy, bilateral pelvic lymphadenectomy (PA node dissection not feasible due to body habitus, and no suspicious nodes on preop imaging). Final pathology revealed residual carcinoma on the surface of the uterus (serosa) and separate additional endometrioid carcinoma in endometrium (  stage I, thought to be second primary). Metastatic carcinoma (consistent with serous) in one of 5 left pelvic lymph nodes (right side all benign). Omentum was benign (though atypical cells were identified in peritoneal washings).  CT chest/abd/pelvis on 09/24/17 (prior to staging surgery) showed no evidence of gross metastatic disease.  She was determined  to most likely have synchronous endometrial (stage I) and ovarian (stage IIIC) cancers of endometrioid and high grade serous histologies respectively after pathology and multidisciplinary conference review. Recommendation was for 6 cycles of adjuvant carboplatin and paclitaxel chemotherapy with genetics consultation.  She commenced chemotherapy with Dr Alvy Bimler on 10/26/17.  BRCA and HRD testing negative.   She completed 6 cycles of adjuvant carboplatin paclitaxel chemotherapy on February 12, 2018.  Posttreatment Ca1 25 was normal at 9.3.  She began developing mid right abdominal wall discomfort in November December 2019.  A CT abd/pelvis on March 13 2018 which showed a 12.3 cm left pelvic sidewall cystic structure favoring lymphocele.  This was drained by CT guided drainage on therapy for 2020.  The patient reported her symptoms improved after this time.  Ca1 25 on June 20, 2018 was normal at 8.2.  Interval Hx:   CT abd/pelvis on 11/19/18 showed no residual fluid collections and no recurrence of cancer.  She has persistent gastritis.   CT abd/pelvis on 02/26/19 showed slight enlargement of external iliac nodes (nonspecific).  She also had signs of stomach thickening and patulous duodenal bulb.   CA 125 was 6 on 02/18/19.   Current Meds:  Outpatient Encounter Medications as of 05/21/2019  Medication Sig  . amitriptyline (ELAVIL) 10 MG tablet Take 2 tablets (20 mg total) by mouth at bedtime.  . Cholecalciferol (VITAMIN D-3) 125 MCG (5000 UT) TABS Take 10,000 Units by mouth daily.   Marland Kitchen dicyclomine (BENTYL) 10 MG capsule Take 1 capsule (10 mg total) by mouth 3 (three) times daily before meals.  Marland Kitchen estradiol (ESTRACE) 1 MG tablet Take 1 tablet by mouth once daily (Patient taking differently: Take 0.5 mg by mouth daily. )  . lisinopril (ZESTRIL) 10 MG tablet Take 1 tablet by mouth once daily (Patient taking differently: Take 10 mg by mouth at bedtime. )  . pantoprazole (PROTONIX) 40 MG  tablet Take 1 tablet (40 mg total) by mouth daily.  . progesterone (PROMETRIUM) 200 MG capsule TAKE 1 CAPSULE BY MOUTH ONCE DAILY AT NIGHT (Patient taking differently: Take 200 mg by mouth at bedtime. )   No facility-administered encounter medications on file as of 05/21/2019.    Allergy:  Allergies  Allergen Reactions  . Other Rash    Pork    Social Hx:   Social History   Socioeconomic History  . Marital status: Married    Spouse name: Media planner  . Number of children: 2  . Years of education: Not on file  . Highest education level: Not on file  Occupational History  . Occupation: unemployed  Tobacco Use  . Smoking status: Never Smoker  . Smokeless tobacco: Never Used  Substance and Sexual Activity  . Alcohol use: Never  . Drug use: Never  . Sexual activity: Not Currently    Comment: calendar  Other Topics Concern  . Not on file  Social History Narrative   Married 26 years.Lives with husband and daughter.Homemaker.   Social Determinants of Health   Financial Resource Strain:   . Difficulty of Paying Living Expenses:   Food Insecurity:   . Worried About Charity fundraiser in the Last  Year:   . Ran Out of Food in the Last Year:   Transportation Needs:   . Film/video editor (Medical):   Marland Kitchen Lack of Transportation (Non-Medical):   Physical Activity:   . Days of Exercise per Week:   . Minutes of Exercise per Session:   Stress:   . Feeling of Stress :   Social Connections:   . Frequency of Communication with Friends and Family:   . Frequency of Social Gatherings with Friends and Family:   . Attends Religious Services:   . Active Member of Clubs or Organizations:   . Attends Archivist Meetings:   Marland Kitchen Marital Status:   Intimate Partner Violence:   . Fear of Current or Ex-Partner:   . Emotionally Abused:   Marland Kitchen Physically Abused:   . Sexually Abused:     Past Surgical Hx:  Past Surgical History:  Procedure Laterality Date  . BIOPSY  04/24/2019    Procedure: BIOPSY;  Surgeon: Rogene Houston, MD;  [facility];  Service: Endoscopy;;  antrum  . CESAREAN SECTION     x2  . CHOLECYSTECTOMY     20 years ago  . COLONOSCOPY N/A 09/12/2018   Procedure: COLONOSCOPY;  Surgeon: Rogene Houston, MD;  [facility];  Service: Endoscopy;  Laterality: N/A;  200  . ESOPHAGOGASTRODUODENOSCOPY N/A 04/24/2019   Procedure: ESOPHAGOGASTRODUODENOSCOPY (EGD);  Surgeon: Rogene Houston, MD;  [facility];  Service: Endoscopy;  Laterality: N/A;  125  . IR IMAGING GUIDED PORT INSERTION  10/24/2017  . OMENTECTOMY N/A 10/02/2017   Procedure: OMENTECTOMY;  Surgeon: Everitt Amber, MD;  Location: [facility];  Service: Gynecology;  Laterality: N/A;  . OTHER SURGICAL HISTORY  07/2017   Left ovary removed  . POLYPECTOMY  04/24/2019   Procedure: POLYPECTOMY;  Surgeon: Rogene Houston, MD;  [facility];  Service: Endoscopy;;  gastric  . ROBOTIC ASSISTED TOTAL HYSTERECTOMY WITH BILATERAL SALPINGO OOPHERECTOMY Right 10/02/2017   Procedure: XI ROBOTIC ASSISTED TOTAL HYSTERECTOMY WITH RIGHT SALPINGO OOPHORECTOMY;  Surgeon: Everitt Amber, MD;  [facility]: [facility];  Service: Gynecology;  Laterality: Right;  . ROBOTIC PELVIC AND PARA-AORTIC LYMPH NODE DISSECTION Bilateral 10/02/2017   Procedure: XI ROBOTIC PELVIC LYMPH NODE DISSECTION;  Surgeon: Everitt Amber, MD;  [facility]: [facility];  Service: Gynecology;  Laterality: Bilateral;    Past Medical Hx:  Past Medical History:  Diagnosis Date  . Abdominal pain    Right mid only with bowel movements  . Blood in urine   . Change in bowel movement    more green color  . Essential hypertension, benign 11/12/2018  . Family history of prostate cancer   . GERD (gastroesophageal reflux disease)   . Headache    after menstrual cycle every month  . History of gallstones   . Hot flashes due to surgical menopause 12/17/2018  . Hypertension   . Neck pain on right side    pulsating  . Numbness and tingling of  right leg   . Obesity   . Obesity (BMI 30.0-34.9) 12/17/2018  . Ovarian cancer, left ([place])   . PPD positive   . Skin rash    Chest, forehead, left arm    Past Gynecological History:  C/s x 2. No history of abnormal paps.  Patient's last menstrual period was 09/24/2017.  Family Hx:  Family History  Problem Relation Age of Onset  . Hypertension Mother   . Prostate cancer Father 2  d. 62  . Diabetes Paternal Aunt   . Heart attack Paternal Aunt   . Heart attack Paternal Grandmother 95  . Thyroid disease Other 14    Review of Systems:  Constitutional  Feels well,    ENT Normal appearing ears and nares bilaterally Skin/Breast  No rash, sores, jaundice, itching, dryness Cardiovascular  No chest pain, shortness of breath, or edema  Pulmonary  No cough or wheeze.  Gastro Intestinal  No nausea, vomitting, or diarrhoea. No bright red blood per rectum, change in bowel movement, or constipation. + abdominal pain for 2 months. Intermittent Genito Urinary  No frequency, urgency, dysuria, no pain, no bleeding. Musculo Skeletal  No myalgia, arthralgia, joint swelling or pain  Neurologic  No weakness, numbness, change in gait,  Psychology  No depression, anxiety, insomnia.   Vitals:  Blood pressure (!) 120/43, pulse 81, temperature 98.5 F (36.9 C), temperature source Temporal, resp. rate 17, height '5\' 3"'$  (1.6 m), weight 172 lb 12.8 oz (78.4 kg), last menstrual period 09/24/2017, SpO2 100 %.  Physical Exam: WD in NAD Neck  Supple NROM, without any enlargements.  Lymph Node Survey No cervical supraclavicular or inguinal adenopathy Cardiovascular  Pulse normal rate, regularity and rhythm. S1 and S2 normal.  Lungs  Clear to auscultation bilateraly, without wheezes/crackles/rhonchi. Good air movement.  Skin  No rash/lesions/breakdown  Psychiatry  Alert and oriented to person, place, and time  Abdomen  Normoactive bowel sounds, abdomen soft, non-tender and obese without  evidence of hernia. Incisions well healed. Back No CVA tenderness Genito Urinary  Vaginal cuff in tact, well healed, no blood, no masses  Rectal  deferred Extremities  No bilateral cyanosis, clubbing or edema.    Thereasa Solo, MD  05/21/2019, 3:08 PM

## 2019-05-21 NOTE — Patient Instructions (Signed)
Dr Denman George will see you in 3 months.  CT scan will be ordered in 3 months to follow-up the last CT scan to monitor that it's stable.  Please notify Dr Denman George at phone number [phone] if you notice vaginal bleeding, new pelvic or abdominal pains, bloating, feeling full easy, or a change in bladder or bowel function.

## 2019-05-22 ENCOUNTER — Telehealth: Payer: Self-pay

## 2019-05-22 LAB — CA 125: Cancer Antigen (CA) 125: 7.1 U/mL (ref 0.0–38.1)

## 2019-05-22 NOTE — Telephone Encounter (Signed)
Told Ms Flanery that the CA-125 was stable and WNL at 7.1 per Joylene John, NP. Pt verbalized understanding.

## 2019-05-26 ENCOUNTER — Other Ambulatory Visit: Payer: Self-pay

## 2019-05-26 ENCOUNTER — Ambulatory Visit (INDEPENDENT_AMBULATORY_CARE_PROVIDER_SITE_OTHER): Payer: 59 | Admitting: Internal Medicine

## 2019-05-26 ENCOUNTER — Encounter (INDEPENDENT_AMBULATORY_CARE_PROVIDER_SITE_OTHER): Payer: Self-pay | Admitting: Internal Medicine

## 2019-05-26 VITALS — BP 140/90 | Ht 63.0 in | Wt 168.2 lb

## 2019-05-26 DIAGNOSIS — I10 Essential (primary) hypertension: Secondary | ICD-10-CM | POA: Diagnosis not present

## 2019-05-26 DIAGNOSIS — L918 Other hypertrophic disorders of the skin: Secondary | ICD-10-CM

## 2019-05-26 DIAGNOSIS — E8941 Symptomatic postprocedural ovarian failure: Secondary | ICD-10-CM

## 2019-05-26 DIAGNOSIS — E669 Obesity, unspecified: Secondary | ICD-10-CM | POA: Diagnosis not present

## 2019-05-26 DIAGNOSIS — E559 Vitamin D deficiency, unspecified: Secondary | ICD-10-CM

## 2019-05-26 DIAGNOSIS — Z0001 Encounter for general adult medical examination with abnormal findings: Secondary | ICD-10-CM

## 2019-05-26 MED ORDER — LISINOPRIL 5 MG PO TABS: 5.0000 mg | ORAL_TABLET | Freq: Every day | ORAL | 1 refills | Status: DC

## 2019-05-26 NOTE — Progress Notes (Signed)
Chief Complaint: This 49 year old lady comes in for an annual physical exam and to address her chronic conditions which are described below. HPI: She follows oncology because of her diagnosis of left ovarian cancer and uterine cancer.  She is doing very well at the present time regarding this. She also has hypertension and she forgot to take her lisinopril yesterday.  However when she has taken it, I have noticed that blood pressure readings in the morning are very low and she does describes feeling lightheaded or dizzy. She also has gastroesophageal reflux disease and takes Protonix for this.  She follows with gastroenterology. She also continues with bioidentical hormone therapy for postmenopausal state and her hot flashes are improved significantly.  She was unable to tolerate a higher dose of estradiol so she is tolerating 0.5 mg every morning and continues with the progesterone 200 mg at night.  Past Medical History:  Diagnosis Date  . Abdominal pain    Right mid only with bowel movements  . Blood in urine   . Change in bowel movement    more green color  . Essential hypertension, benign 11/12/2018  . Family history of prostate cancer   . GERD (gastroesophageal reflux disease)   . Headache    after menstrual cycle every month  . History of gallstones   . Hot flashes due to surgical menopause 12/17/2018  . Hypertension   . Neck pain on right side    pulsating  . Numbness and tingling of right leg   . Obesity   . Obesity (BMI 30.0-34.9) 12/17/2018  . Ovarian cancer, left ([place])   . PPD positive   . Skin rash    Chest, forehead, left arm   Past Surgical History:  Procedure Laterality Date  . BIOPSY  04/24/2019   Procedure: BIOPSY;  Surgeon: Rogene Houston, MD;  [facility];  Service: Endoscopy;;  antrum  . CESAREAN SECTION     x2  . CHOLECYSTECTOMY     20 years ago  . COLONOSCOPY N/A 09/12/2018   Procedure: COLONOSCOPY;  Surgeon: Rogene Houston, MD;   [facility];  Service: Endoscopy;  Laterality: N/A;  200  . ESOPHAGOGASTRODUODENOSCOPY N/A 04/24/2019   Procedure: ESOPHAGOGASTRODUODENOSCOPY (EGD);  Surgeon: Rogene Houston, MD;  [facility];  Service: Endoscopy;  Laterality: N/A;  125  . IR IMAGING GUIDED PORT INSERTION  10/24/2017  . OMENTECTOMY N/A 10/02/2017   Procedure: OMENTECTOMY;  Surgeon: Everitt Amber, MD;  [facility];  Service: Gynecology;  Laterality: N/A;  . OTHER SURGICAL HISTORY  07/2017   Left ovary removed  . POLYPECTOMY  04/24/2019   Procedure: POLYPECTOMY;  Surgeon: Rogene Houston, MD;  [facility];  Service: Endoscopy;;  gastric  . ROBOTIC ASSISTED TOTAL HYSTERECTOMY WITH BILATERAL SALPINGO OOPHERECTOMY Right 10/02/2017   Procedure: XI ROBOTIC ASSISTED TOTAL HYSTERECTOMY WITH RIGHT SALPINGO OOPHORECTOMY;  Surgeon: Everitt Amber, MD;  [facility];  Service: Gynecology;  Laterality: Right;  . ROBOTIC PELVIC AND PARA-AORTIC LYMPH NODE DISSECTION Bilateral 10/02/2017   Procedure: XI ROBOTIC PELVIC LYMPH NODE DISSECTION;  Surgeon: Everitt Amber, MD;  [facility];  Service: Gynecology;  Laterality: Bilateral;     Social History   Social History Narrative   Married 26 years.Lives with husband and daughter.Homemaker.    Social History   Tobacco Use  . Smoking status: Never Smoker  . Smokeless tobacco: Never Used  Substance Use Topics  . Alcohol use: Never  Allergies:  Allergies  Allergen Reactions  . Other Rash    Pork     Current Meds  Medication Sig  . amitriptyline (ELAVIL) 10 MG tablet Take 2 tablets (20 mg total) by mouth at bedtime.  . Cholecalciferol (VITAMIN D-3) 125 MCG (5000 UT) TABS Take 10,000 Units by mouth daily.   Marland Kitchen dicyclomine (BENTYL) 10 MG capsule Take 1 capsule (10 mg total) by mouth 3 (three) times daily before meals.  Marland Kitchen estradiol (ESTRACE) 1 MG tablet Take 1 tablet by mouth once daily (Patient taking differently: Take 0.5 mg by mouth daily. )   . lisinopril (ZESTRIL) 10 MG tablet Take 1 tablet by mouth once daily (Patient taking differently: Take 10 mg by mouth at bedtime. )  . pantoprazole (PROTONIX) 40 MG tablet Take 1 tablet (40 mg total) by mouth daily.  . progesterone (PROMETRIUM) 200 MG capsule TAKE 1 CAPSULE BY MOUTH ONCE DAILY AT NIGHT (Patient taking differently: Take 200 mg by mouth at bedtime. )      Bio-identical hormones Micronized progesterone is being used in this patient for multiple benefits based on studies including protection against uterine cancer, breast cancer, osteoporosis and heart disease. The patient has been counseled regarding side effects, benefits and modes of administration. The patient is agreeable that this therapy is an integral part of her wellness, quality of life and prevention of chronic disease.  Estradiol is being used in this patient for multiple benefits based on several studies including protection against heart disease, cerebrovascular disease, osteoporosis, colon cancer, Alzheimer's disease, macular degeneration and cataracts. The patient has been counseled regarding benefits and side effects and modes of administration. The patient is agreeable that this therapy is an integral to part of her wellness, quality of life and prevention of chronic disease.  [id number] from the symptoms mentioned above,there are no other symptoms referable to all systems reviewed.  Physical Exam: Blood pressure 140/90, height 5\' 3"  (1.6 m), weight 168 lb 3.2 oz (76.3 kg), last menstrual period 09/24/2017. Vitals with BMI 05/26/2019 05/21/2019 04/24/2019  Height 5\' 3"  5\' 3"  -  Weight 168 lbs 3 oz 172 lbs 13 oz -  BMI [id number] -  Systolic [id number]  Diastolic 90 43 47  Pulse - 81 73      She looks systemically well.  She is overweight. General: Alert, cooperative, and appears to be the stated age.No pallor.  No jaundice.  No clubbing. Head: Normocephalic Eyes: Sclera white, pupils equal and reactive  to light, red reflex x 2,  Ears: Normal bilaterally Oral cavity: Lips, mucosa, and tongue normal: Teeth and gums normal Neck: No adenopathy, supple, symmetrical, trachea midline, and thyroid does not appear enlarged. Breast: No masses felt. Respiratory: Clear to auscultation bilaterally.No wheezing, crackles or bronchial breathing. Cardiovascular: Heart sounds are present and appear to be normal without murmurs or added sounds.  No carotid bruits.  Peripheral pulses are present and equal bilaterally.: Gastrointestinal:positive bowel sounds, no hepatosplenomegaly.  No masses felt.No tenderness. Skin: Clear, No rashes noted.No worrisome skin lesions seen.  There is a skin tag which is measuring at least 2 cm in diameter on the anterior aspect of the right shoulder area/axilla. Neurological: Grossly intact without focal findings, cranial nerves II through XII intact, muscle strength equal bilaterally Musculoskeletal: No acute joint abnormalities noted.Full range of movement noted with joints. Psychiatric: Affect appropriate, non-anxious.    Assessment  1. Essential hypertension, benign   2. Obesity (BMI 30.0-34.9)   3. Hot flashes due to  surgical menopause   4. Vitamin D deficiency disease   5. Skin tag   6. Encounter for general adult medical examination with abnormal findings     Tests Ordered:   Orders Placed This Encounter  Procedures  . CBC  . COMPLETE METABOLIC PANEL WITH GFR  . Hemoglobin A1c  . Estradiol  . Lipid panel  . Progesterone  . T3, free  . TSH  . VITAMIN D 25 Hydroxy (Vit-D Deficiency, Fractures)  . Ambulatory referral to Dermatology     Plan  1. Blood work is ordered above. 2. She will continue with antihypertensive therapy and her blood pressure is actually very well controlled to the point where I decided that we will reduce the lisinopril to 5 mg daily and I have sent a new prescription for this. 3. Her menopausal symptoms are under reasonably good  control with bioidentical hormone therapy and she will continue with this and I will check levels today. 4. She also has a skin tag on the right anterior shoulder area/axillary area and I will send her to dermatology for removal. 5. She will continue vitamin D3 supplementation for vitamin D deficiency and we will check levels today. 6. Further recommendations will depend on blood results and I will see her in 3 months time for follow-up. 7. Today, in addition to a preventative visit, I performed an office visit to address her chronic conditions above.     Meds ordered this encounter  Medications  . lisinopril (ZESTRIL) 5 MG tablet    Sig: Take 1 tablet (5 mg total) by mouth daily.    Dispense:  90 tablet    Refill:  1     Monte Bronder C Ceylon Arenson   05/26/2019, 10:41 AM

## 2019-05-26 NOTE — Patient Instructions (Addendum)
[facility] Dietary Recommendations for Weight Loss What to Avoid . Avoid added sugars o Often added sugar can be found in processed foods such as many condiments, dry cereals, cakes, cookies, chips, crisps, crackers, candies, sweetened drinks, etc.  o Read labels and AVOID/DECREASE use of foods with the following in their ingredient list: Sugar, fructose, high fructose corn syrup, sucrose, glucose, maltose, dextrose, molasses, cane sugar, brown sugar, any type of syrup, agave nectar, etc.   . Avoid snacking in between meals . Avoid foods made with flour o If you are going to eat food made with flour, choose those made with whole-grains; and, minimize your consumption as much as is tolerable . Avoid processed foods o These foods are generally stocked in the middle of the grocery store. Focus on shopping on the perimeter of the grocery.  . Avoid Meat  o We recommend following a plant-based diet at [facility]. Thus, we recommend avoiding meat as a general rule. Consider eating beans, legumes, eggs, and/or dairy products for regular protein sources o If you plan on eating meat limit to 4 ounces of meat at a time and choose lean options such as Fish, chicken, turkey. Avoid red meat intake such as pork and/or steak What to Include . Vegetables o GREEN LEAFY VEGETABLES: Kale, spinach, mustard greens, collard greens, cabbage, broccoli, etc. o OTHER: Asparagus, cauliflower, eggplant, carrots, peas, Brussel sprouts, tomatoes, bell peppers, zucchini, beets, cucumbers, etc. . Grains, seeds, and legumes o Beans: kidney beans, black eyed peas, garbanzo beans, black beans, pinto beans, etc. o Whole, unrefined grains: brown rice, barley, bulgur, oatmeal, etc. . Healthy fats  o Avoid highly processed fats such as vegetable oil o Examples of healthy fats: avocado, olives, virgin olive oil, dark chocolate (?72% Cocoa), nuts (peanuts, almonds, walnuts, cashews, pecans, etc.) . None to Low  Intake of Animal Sources of Protein o Meat sources: chicken, turkey, salmon, tuna. Limit to 4 ounces of meat at one time. o Consider limiting dairy sources, but when choosing dairy focus on: PLAIN Greek yogurt, cottage cheese, high-protein milk . Fruit o Choose berries  When to Eat . Intermittent Fasting: o Choosing not to eat for a specific time period, but DO FOCUS ON HYDRATION when fasting o Multiple Techniques: - Time Restricted Eating: eat 3 meals in a day, each meal lasting no more than 60 minutes, no snacks between meals - 16-18 hour fast: fast for 16 to 18 hours up to 7 days a week. Often suggested to start with 2-3 nonconsecutive days per week.  . Remember the time you sleep is counted as fasting.  . Examples of eating schedule: Fast from 7:00pm-11:00am. Eat between 11:00am-7:00pm.  - 24-hour fast: fast for 24 hours up to every other day. Often suggested to start with 1 day per week . Remember the time you sleep is counted as fasting . Examples of eating schedule:  o Eating day: eat 2-3 meals on your eating day. If doing 2 meals, each meal should last no more than 90 minutes. If doing 3 meals, each meal should last no more than 60 minutes. Finish last meal by 7:00pm. o Fasting day: Fast until 7:00pm.  o IF YOU FEEL UNWELL FOR ANY REASON/IN ANY WAY WHEN FASTING, STOP FASTING BY EATING A NUTRITIOUS SNACK OR LIGHT MEAL o ALWAYS FOCUS ON HYDRATION DURING FASTS - Acceptable Hydration sources: water, broths, tea/coffee (black tea/coffee is best but using a small amount of whole-fat dairy products in coffee/tea is acceptable).  -   Poor Hydration Sources: anything with sugar or artificial sweeteners added to it  These recommendations have been developed for patients that are actively receiving medical care from either Dr. Anastasio Champion or Jeralyn Ruths, DNP, NP-C at [facility]. These recommendations are developed for patients with specific medical conditions and are not meant to be  distributed or used by others that are not actively receiving care from either provider listed above at [facility]. It is not appropriate to participate in the above eating plans without proper medical supervision.   Reference: Rexanne Mano. The obesity code. [place]; 2016.   Hypertension, Adult Hypertension is another name for high blood pressure. High blood pressure forces your heart to work harder to pump blood. This can cause problems over time. There are two numbers in a blood pressure reading. There is a top number (systolic) over a bottom number (diastolic). It is best to have a blood pressure that is below 120/80. Healthy choices can help lower your blood pressure, or you may need medicine to help lower it. What are the causes? The cause of this condition is not known. Some conditions may be related to high blood pressure. What increases the risk?  Smoking.  Having type 2 diabetes mellitus, high cholesterol, or both.  Not getting enough exercise or physical activity.  Being overweight.  Having too much fat, sugar, calories, or salt (sodium) in your diet.  Drinking too much alcohol.  Having long-term (chronic) kidney disease.  Having a family history of high blood pressure.  Age. Risk increases with age.  Race. You may be at higher risk if you are African American.  Gender. Men are at higher risk than women before age 108. After age 74, women are at higher risk than men.  Having obstructive sleep apnea.  Stress. What are the signs or symptoms?  High blood pressure may not cause symptoms. Very high blood pressure (hypertensive crisis) may cause: ? Headache. ? Feelings of worry or nervousness (anxiety). ? Shortness of breath. ? Nosebleed. ? A feeling of being sick to your stomach (nausea). ? Throwing up (vomiting). ? Changes in how you see. ? Very bad chest pain. ? Seizures. How is this treated?  This condition is treated by making  healthy lifestyle changes, such as: ? Eating healthy foods. ? Exercising more. ? Drinking less alcohol.  Your health care provider may prescribe medicine if lifestyle changes are not enough to get your blood pressure under control, and if: ? Your top number is above 130. ? Your bottom number is above 80.  Your personal target blood pressure may vary. Follow these instructions at home: Eating and drinking   If told, follow the DASH eating plan. To follow this plan: ? Fill one half of your plate at each meal with fruits and vegetables. ? Fill one fourth of your plate at each meal with whole grains. Whole grains include whole-wheat pasta, brown rice, and whole-grain bread. ? Eat or drink low-fat dairy products, such as skim milk or low-fat yogurt. ? Fill one fourth of your plate at each meal with low-fat (lean) proteins. Low-fat proteins include fish, chicken without skin, eggs, beans, and tofu. ? Avoid fatty meat, cured and processed meat, or chicken with skin. ? Avoid pre-made or processed food.  Eat less than 1,500 mg of salt each day.  Do not drink alcohol if: ? Your doctor tells you not to drink. ? You are pregnant, may be pregnant, or are planning to become pregnant.  If you drink alcohol: ? Limit how much you use to:  0-1 drink a day for women.  0-2 drinks a day for men. ? Be aware of how much alcohol is in your drink. In the [place] one drink equals one 12 oz bottle of beer (355 mL), one 5 oz glass of wine (148 mL), or one 1 oz glass of hard liquor (44 mL). Lifestyle   Work with your doctor to stay at a healthy weight or to lose weight. Ask your doctor what the best weight is for you.  Get at least 30 minutes of exercise most days of the week. This may include walking, swimming, or biking.  Get at least 30 minutes of exercise that strengthens your muscles (resistance exercise) at least 3 days a week. This may include lifting weights or doing Pilates.  Do not use any  products that contain nicotine or tobacco, such as cigarettes, e-cigarettes, and chewing tobacco. If you need help quitting, ask your doctor.  Check your blood pressure at home as told by your doctor.  Keep all follow-up visits as told by your doctor. This is important. Medicines  Take over-the-counter and prescription medicines only as told by your doctor. Follow directions carefully.  Do not skip doses of blood pressure medicine. The medicine does not work as well if you skip doses. Skipping doses also puts you at risk for problems.  Ask your doctor about side effects or reactions to medicines that you should watch for. Contact a doctor if you:  Think you are having a reaction to the medicine you are taking.  Have headaches that keep coming back (recurring).  Feel dizzy.  Have swelling in your ankles.  Have trouble with your vision. Get help right away if you:  Get a very bad headache.  Start to feel mixed up (confused).  Feel weak or numb.  Feel faint.  Have very bad pain in your: ? Chest. ? Belly (abdomen).  Throw up more than once.  Have trouble breathing. Summary  Hypertension is another name for high blood pressure.  High blood pressure forces your heart to work harder to pump blood.  For most people, a normal blood pressure is less than 120/80.  Making healthy choices can help lower blood pressure. If your blood pressure does not get lower with healthy choices, you may need to take medicine. This information is not intended to replace advice given to you by your health care provider. Make sure you discuss any questions you have with your health care provider. Document Revised: 10/24/2017 Document Reviewed: 10/24/2017 Elsevier Patient Education  2020 [employer].

## 2019-05-27 LAB — LIPID PANEL
Cholesterol: 186 mg/dL (ref <200)
HDL: 48 mg/dL — ABNORMAL LOW (ref >=50)
LDL Cholesterol (Calc): 117 mg/dL (calc) — ABNORMAL HIGH
Non-HDL Cholesterol (Calc): 138 mg/dL (calc) — ABNORMAL HIGH (ref <130)
Total CHOL/HDL Ratio: 3.9 (calc) (ref <5.0)
Triglycerides: 100 mg/dL (ref <150)

## 2019-05-27 LAB — ESTRADIOL: Estradiol: <15 pg/mL

## 2019-05-27 LAB — CBC
HCT: 44.8 % (ref 35.0–45.0)
Hemoglobin: 14.3 g/dL (ref 11.7–15.5)
MCH: 27.5 pg (ref 27.0–33.0)
MCHC: 31.9 g/dL — ABNORMAL LOW (ref 32.0–36.0)
MCV: 86.2 fL (ref 80.0–100.0)
MPV: 11 fL (ref 7.5–12.5)
Platelets: 347 10*3/uL (ref 140–400)
RBC: 5.2 10*6/uL — ABNORMAL HIGH (ref 3.80–5.10)
RDW: 12.9 % (ref 11.0–15.0)
WBC: 7.3 10*3/uL (ref 3.8–10.8)

## 2019-05-27 LAB — COMPLETE METABOLIC PANEL WITH GFR
AG Ratio: 1.6 (calc) (ref 1.0–2.5)
ALT: 17 U/L (ref 6–29)
AST: 18 U/L (ref 10–35)
Albumin: 4.7 g/dL (ref 3.6–5.1)
Alkaline phosphatase (APISO): 82 U/L (ref 31–125)
BUN: 15 mg/dL (ref 7–25)
CO2: 28 mmol/L (ref 20–32)
Calcium: 10.1 mg/dL (ref 8.6–10.2)
Chloride: 104 mmol/L (ref 98–110)
Creat: 0.69 mg/dL (ref 0.50–1.10)
GFR, Est African American: 118 mL/min/{1.73_m2} (ref >=60)
GFR, Est Non African American: 102 mL/min/{1.73_m2} (ref >=60)
Globulin: 2.9 g/dL (calc) (ref 1.9–3.7)
Glucose, Bld: 99 mg/dL (ref 65–99)
Potassium: 4.5 mmol/L (ref 3.5–5.3)
Sodium: 139 mmol/L (ref 135–146)
Total Bilirubin: 0.4 mg/dL (ref 0.2–1.2)
Total Protein: 7.6 g/dL (ref 6.1–8.1)

## 2019-05-27 LAB — HEMOGLOBIN A1C
Hgb A1c MFr Bld: 5.5 % of total Hgb (ref <5.7)
Mean Plasma Glucose: 111 (calc)
eAG (mmol/L): 6.2 (calc)

## 2019-05-27 LAB — PROGESTERONE: Progesterone: 7.4 ng/mL

## 2019-05-27 LAB — TSH: TSH: 0.85 mIU/L

## 2019-05-27 LAB — T3, FREE: T3, Free: 3.2 pg/mL (ref 2.3–4.2)

## 2019-05-27 LAB — VITAMIN D 25 HYDROXY (VIT D DEFICIENCY, FRACTURES): Vit D, 25-Hydroxy: 68 ng/mL (ref 30–100)

## 2019-05-28 ENCOUNTER — Other Ambulatory Visit (INDEPENDENT_AMBULATORY_CARE_PROVIDER_SITE_OTHER): Payer: Self-pay

## 2019-05-28 IMAGING — MG DIGITAL SCREENING BILATERAL MAMMOGRAM WITH TOMO AND CAD
8 of 14 series · 8 of 40 positions shown · non-contrast
Comparison: Previous exam(s).

CLINICAL DATA: Screening.

EXAM:
DIGITAL SCREENING BILATERAL MAMMOGRAM WITH TOMO AND CAD

[L CC synth-2D (1 of 2)]
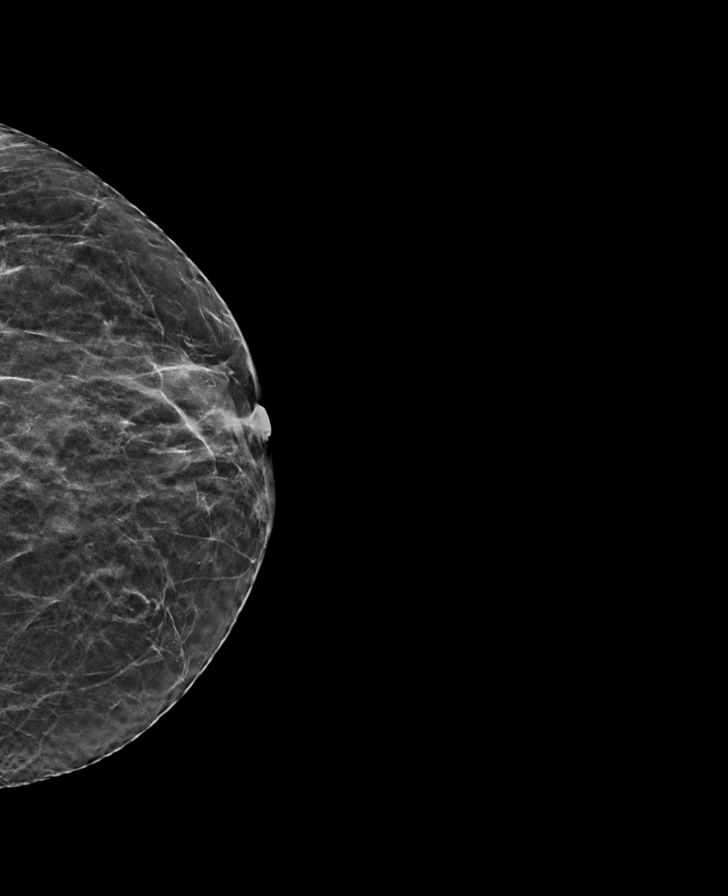

[R CC synth-2D (1 of 2)]
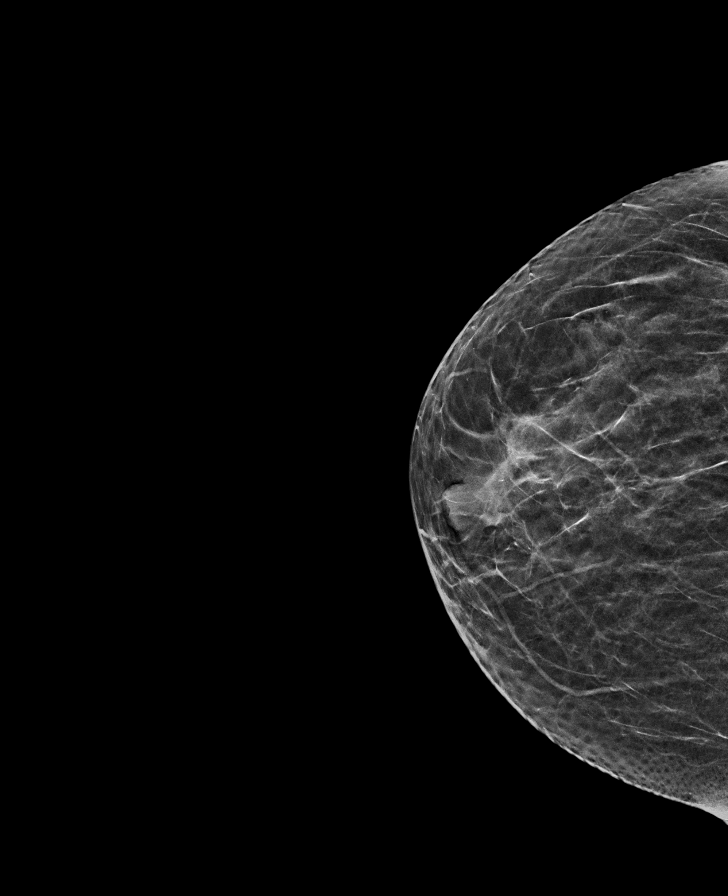

[R CC synth-2D (2 of 2)]
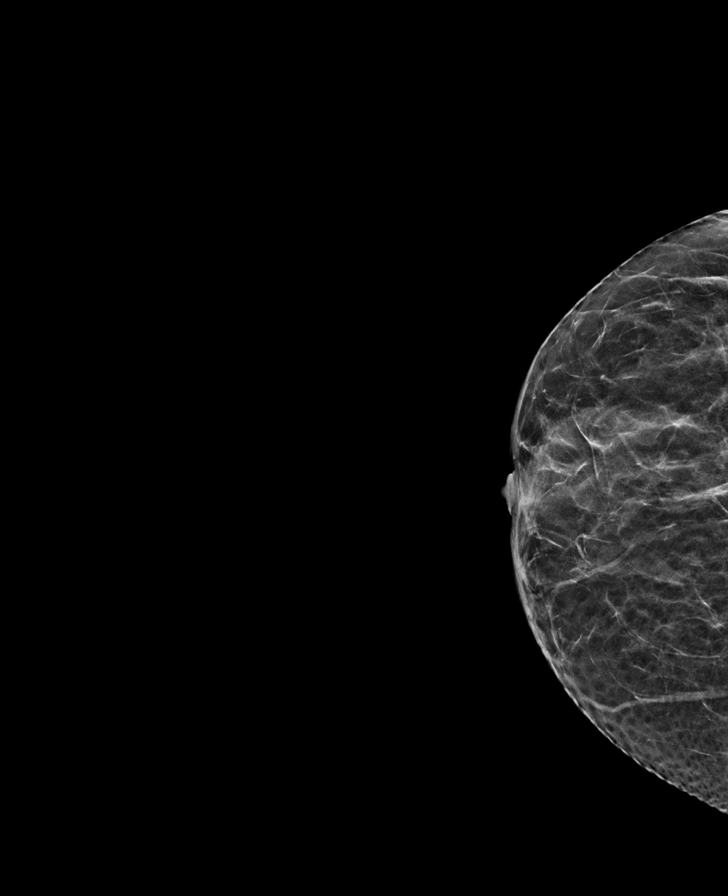

[R MLO synth-2D (1 of 2)]
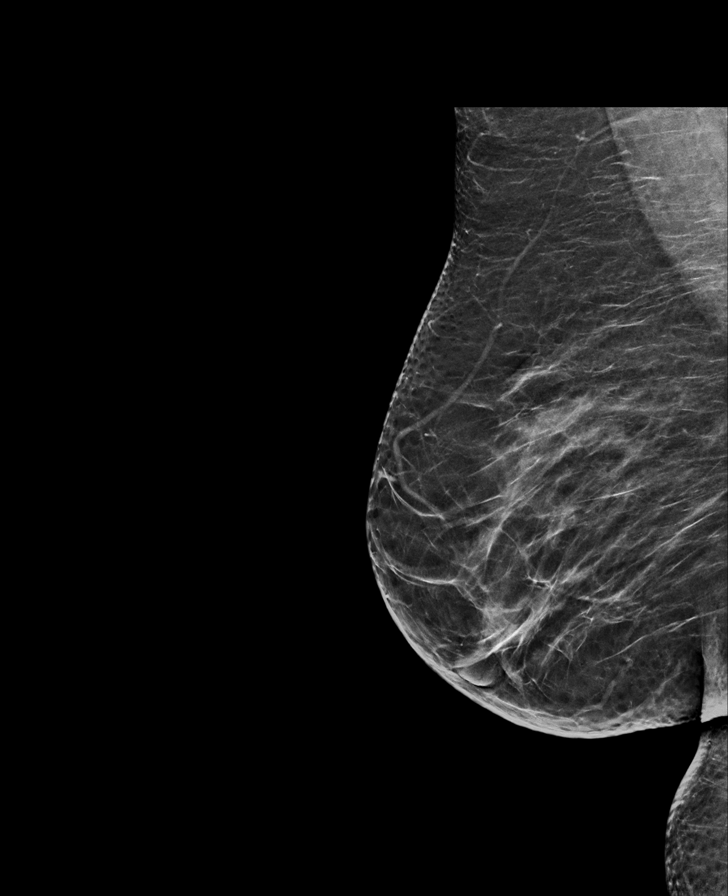

[L MLO synth-2D]
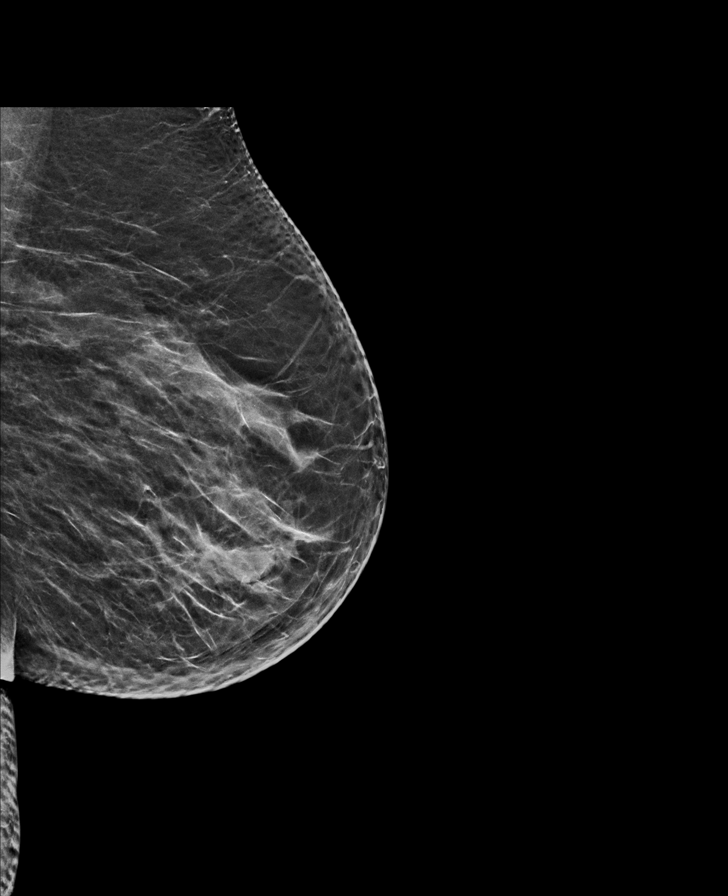

[L CC synth-2D (2 of 2)]
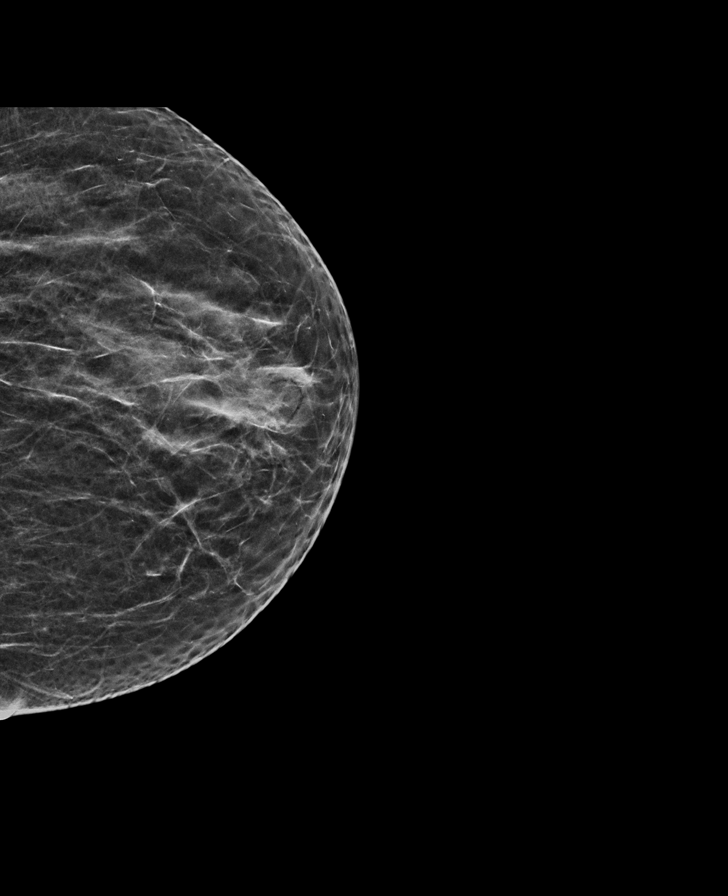

[R MLO synth-2D (2 of 2)]
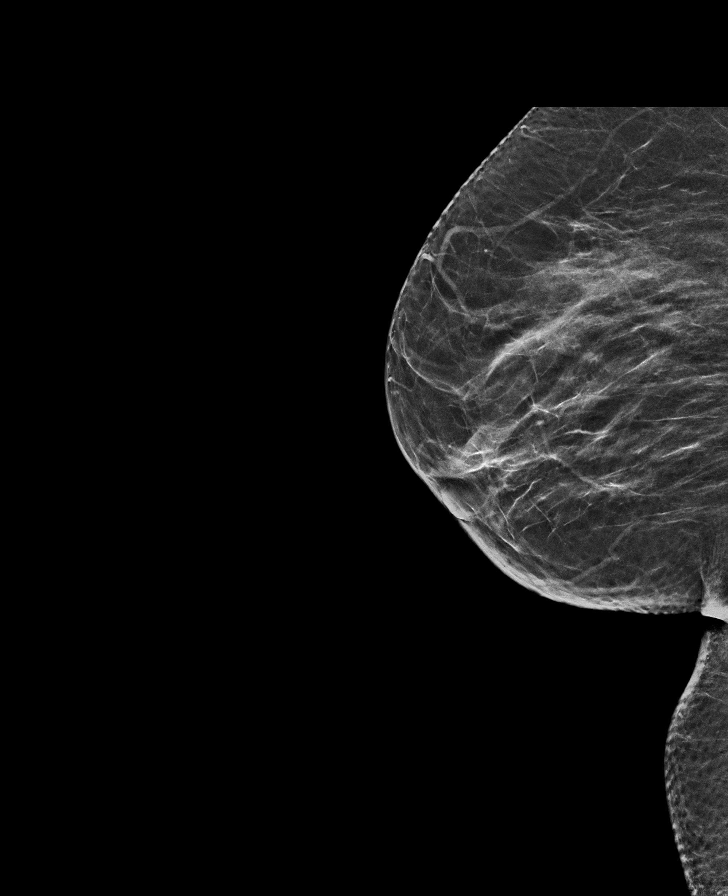

[L CC tomo · tomo slice 23/45.0]
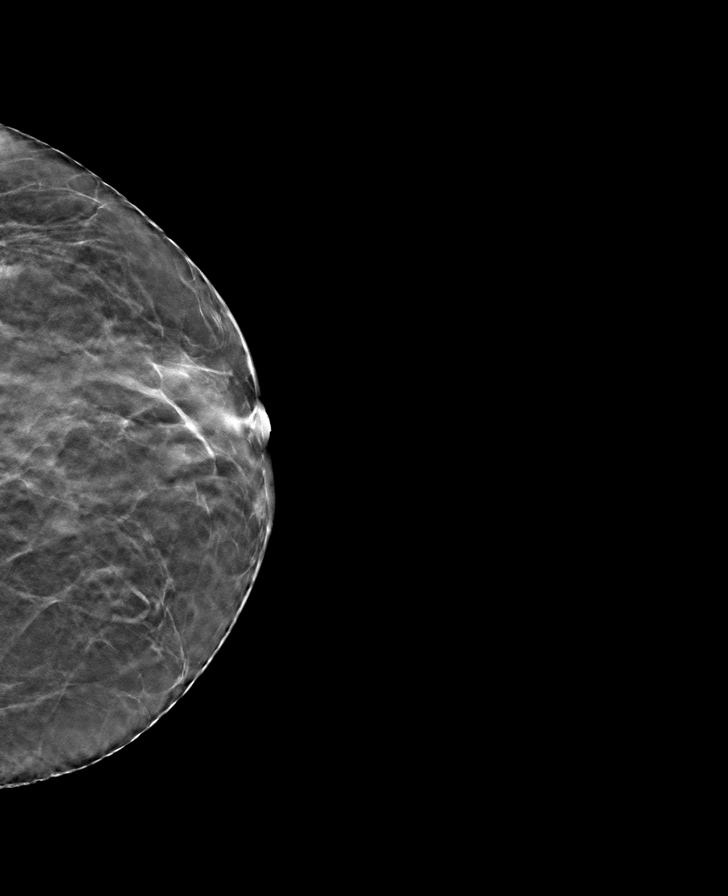

[8 of 40 positions shown; findings below may reference images not displayed]

ACR Breast Density Category c: The breast tissue is heterogeneously
dense, which may obscure small masses.
FINDINGS: There are no findings suspicious for malignancy. Images were
processed with CAD.
IMPRESSION: No mammographic evidence of malignancy. A result letter of this
screening mammogram will be mailed directly to the patient.

RECOMMENDATION:
Screening mammogram in one year. (Code:[id number])

BI-RADS CATEGORY  1: Negative.

## 2019-05-28 MED ORDER — ESTRADIOL 0.5 MG PO TABS: 0.5000 mg | ORAL_TABLET | Freq: Every day | ORAL | 3 refills | Status: DC

## 2019-05-31 ENCOUNTER — Other Ambulatory Visit (INDEPENDENT_AMBULATORY_CARE_PROVIDER_SITE_OTHER): Payer: Self-pay | Admitting: Internal Medicine

## 2019-06-03 ENCOUNTER — Other Ambulatory Visit: Payer: Self-pay

## 2019-06-03 ENCOUNTER — Encounter (INDEPENDENT_AMBULATORY_CARE_PROVIDER_SITE_OTHER): Payer: Self-pay | Admitting: Internal Medicine

## 2019-06-03 ENCOUNTER — Ambulatory Visit (INDEPENDENT_AMBULATORY_CARE_PROVIDER_SITE_OTHER): Payer: 59 | Admitting: Internal Medicine

## 2019-06-03 DIAGNOSIS — R1013 Epigastric pain: Secondary | ICD-10-CM | POA: Diagnosis not present

## 2019-06-03 DIAGNOSIS — K219 Gastro-esophageal reflux disease without esophagitis: Secondary | ICD-10-CM | POA: Diagnosis not present

## 2019-06-03 DIAGNOSIS — R110 Nausea: Secondary | ICD-10-CM | POA: Insufficient documentation

## 2019-06-03 MED ORDER — ONDANSETRON 4 MG PO TBDP: 4.0000 mg | ORAL_TABLET | Freq: Three times a day (TID) | ORAL | 0 refills | Status: DC | PRN

## 2019-06-03 NOTE — Patient Instructions (Addendum)
Notify if nausea worsens or if you have vomiting or diarrhea. Starting next month you can drop dicyclomine dose to 10 mg twice daily as discussed. Send in a prescription for Zofran she says she does need regularly she will have it available she will come back in

## 2019-06-03 NOTE — Progress Notes (Signed)
Presenting complaint;  Follow-up for GERD and epigastric pain.  Database and subjective:  Patient is 49 year old Hispanic female who is here for scheduled visit.  She was seen in in the office on 03/24/2019 for epigastric and burning chest pain of 6 weeks duration not responding fully to PPI.  She had abdominal pelvic CT which revealed gastric wall thickening.  She underwent esophagogastroduodenoscopy on 04/24/2019 revealing multiple gastric polyps 5 of these polyps were biopsied along with biopsy from the antrum.  Patient was asked to continue pantoprazole and dicyclomine was added at a dose of 10 mg before each meal.  Antral biopsy revealed mild chronic gastritis with reactive changes but no evidence of H. pylori and gastric polyps were fundic gland polyps. Patient reported less pain with dicyclomine. I recommended follow-up visit in 4 to 6 weeks.  Patient states she was doing fine until Easter Sunday when she had fried fish along with salad and beans.  She feels she may have eaten too much fish.  Since then she has had nausea and bloating some pain but no vomiting fever or chills.  She did experience nausea this morning but she feels she may be turning the corner.  She has not experienced diarrhea constipation melena or rectal bleeding or any other symptoms.  She states she has received first dose of Pfizer vaccine.  She did not have any significant side effects.  She has gained 7 pounds in the last 10 weeks.  She says she is walking 1 mile at least 3 times a week.  Current Medications: Outpatient Encounter Medications as of 06/03/2019  Medication Sig  . amitriptyline (ELAVIL) 10 MG tablet Take 2 tablets (20 mg total) by mouth at bedtime.  . Cholecalciferol (VITAMIN D-3) 125 MCG (5000 UT) TABS Take 10,000 Units by mouth daily.   Marland Kitchen dicyclomine (BENTYL) 10 MG capsule Take 1 capsule (10 mg total) by mouth 3 (three) times daily before meals.  Marland Kitchen estradiol (ESTRACE) 0.5 MG tablet Take 1 tablet (0.5 mg  total) by mouth daily.  Marland Kitchen lisinopril (ZESTRIL) 5 MG tablet Take 1 tablet (5 mg total) by mouth daily.  . pantoprazole (PROTONIX) 40 MG tablet Take 1 tablet (40 mg total) by mouth daily.  . progesterone (PROMETRIUM) 200 MG capsule TAKE 1 CAPSULE BY MOUTH ONCE DAILY AT NIGHT (Patient taking differently: Take 400 mg by mouth daily. )  . [DISCONTINUED] lisinopril (ZESTRIL) 10 MG tablet Take 1 tablet by mouth once daily (Patient not taking: No sig reported)  . [DISCONTINUED] progesterone (PROMETRIUM) 200 MG capsule TAKE 1 CAPSULE BY MOUTH ONCE DAILY AT NIGHT (Patient taking differently: Take 200 mg by mouth at bedtime. )   No facility-administered encounter medications on file as of 06/03/2019.     Objective: Blood pressure 112/79, pulse 91, temperature (!) 97.4 F (36.3 C), temperature source Temporal, height '5\' 3"'$  (1.6 m), weight 172 lb 6.4 oz (78.2 kg), last menstrual period 09/24/2017. Patient is alert and in no acute distress. She is wearing a facial mask. Conjunctiva is pink. Sclera is nonicteric Oropharyngeal mucosa is normal. No neck masses or thyromegaly noted. Cardiac exam with regular rhythm normal S1 and S2. No murmur or gallop noted. Lungs are clear to auscultation. Abdomen is symmetrical.  Bowel sounds are normal.  On palpation it is soft and nontender with no organomegaly or masses. No LE edema or clubbing noted.  Labs/studies Results:  CBC Latest Ref Rng & Units 05/26/2019 03/26/2019 06/20/2018  WBC 3.8 - 10.8 Thousand/uL 7.3 7.8 7.7  Hemoglobin 11.7 - 15.5 g/dL 14.3 13.1 12.7  Hematocrit 35.0 - 45.0 % 44.8 40.2 38.9  Platelets 140 - 400 Thousand/uL 347 333 272    CMP Latest Ref Rng & Units 05/26/2019 02/18/2019 12/17/2018  Glucose 65 - 99 mg/dL 99 91 92  BUN 7 - 25 mg/dL '15 13 15  '$ Creatinine 0.50 - 1.10 mg/dL 0.69 0.68 0.63  Sodium 135 - 146 mmol/L 139 141 142  Potassium 3.5 - 5.3 mmol/L 4.5 4.2 4.4  Chloride 98 - 110 mmol/L 104 106 103  CO2 20 - 32 mmol/L '28 23 27   '$ Calcium 8.6 - 10.2 mg/dL 10.1 9.0 10.6(H)  Total Protein 6.1 - 8.1 g/dL 7.6 - 7.4  Total Bilirubin 0.2 - 1.2 mg/dL 0.4 - 0.4  Alkaline Phos 38 - 126 U/L - - -  AST 10 - 35 U/L 18 - 20  ALT 6 - 29 U/L 17 - 16    Hepatic Function Latest Ref Rng & Units 05/26/2019 12/17/2018 06/20/2018  Total Protein 6.1 - 8.1 g/dL 7.6 7.4 7.3  Albumin 3.5 - 5.0 g/dL - - 4.0  AST 10 - 35 U/L '18 20 19  '$ ALT 6 - 29 U/L '17 16 18  '$ Alk Phosphatase 38 - 126 U/L - - 90  Total Bilirubin 0.2 - 1.2 mg/dL 0.4 0.4 0.3    Lab from 05/26/2019 reviewed.  Assessment:  #1.  GERD.  She is doing well with therapy.  Eventually she may be able to come off the PPI.  #2.  Epigastric pain.  She responded to dicyclomine.  Recent EGD was negative for H. pylori infection.  She may be considered to have dyspepsia.  #3.  Recent episode of bloating and nausea triggered by eating fried fish.  She is getting better.  Recent LFTs are normal.  Plan:  Ondansetron 4 mg p.o. 3 times daily as needed. If nausea worsens or if she has vomiting or diarrhea she will call office. She can decrease dicyclomine to 10 mg twice daily starting next month. Office visit in 6 months.

## 2019-06-09 ENCOUNTER — Ambulatory Visit: Payer: 59

## 2019-06-25 ENCOUNTER — Telehealth (INDEPENDENT_AMBULATORY_CARE_PROVIDER_SITE_OTHER): Payer: Self-pay

## 2019-06-25 ENCOUNTER — Other Ambulatory Visit (INDEPENDENT_AMBULATORY_CARE_PROVIDER_SITE_OTHER): Payer: Self-pay | Admitting: Internal Medicine

## 2019-06-25 MED ORDER — PROGESTERONE 200 MG PO CAPS: 400.0000 mg | ORAL_CAPSULE | Freq: Every day | ORAL | 0 refills | Status: DC

## 2019-07-03 NOTE — Telephone Encounter (Signed)
Refill was sent

## 2019-07-25 ENCOUNTER — Other Ambulatory Visit (INDEPENDENT_AMBULATORY_CARE_PROVIDER_SITE_OTHER): Payer: Self-pay | Admitting: Internal Medicine

## 2019-07-25 ENCOUNTER — Other Ambulatory Visit (INDEPENDENT_AMBULATORY_CARE_PROVIDER_SITE_OTHER): Payer: Self-pay | Admitting: Gastroenterology

## 2019-08-14 ENCOUNTER — Other Ambulatory Visit (INDEPENDENT_AMBULATORY_CARE_PROVIDER_SITE_OTHER): Payer: Self-pay | Admitting: Internal Medicine

## 2019-08-14 DIAGNOSIS — I10 Essential (primary) hypertension: Secondary | ICD-10-CM

## 2019-08-14 MED ORDER — LISINOPRIL 5 MG PO TABS: 5.0000 mg | ORAL_TABLET | Freq: Every day | ORAL | 0 refills | Status: DC

## 2019-08-14 NOTE — Addendum Note (Signed)
Addended by: Jeralyn Ruths E on: 08/14/2019 01:39 PM   Modules accepted: Orders

## 2019-08-14 NOTE — Telephone Encounter (Signed)
Please find out if this patient has been taking this medication or if she stopped it. It is not clear if she was told to stop it back in April by another provider or not.

## 2019-08-14 NOTE — Telephone Encounter (Signed)
Please advise 

## 2019-08-14 NOTE — Telephone Encounter (Signed)
Dr Darnell Level said for her to take 5 mg /daily pt stated. She will be in 7/12 for f/u with Dr Darnell Level.

## 2019-08-21 ENCOUNTER — Other Ambulatory Visit: Payer: Self-pay

## 2019-08-21 ENCOUNTER — Inpatient Hospital Stay: Payer: 59 | Attending: Gynecologic Oncology

## 2019-08-21 ENCOUNTER — Inpatient Hospital Stay: Payer: 59

## 2019-08-21 ENCOUNTER — Ambulatory Visit (HOSPITAL_COMMUNITY)
Admission: RE | Admit: 2019-08-21 | Discharge: 2019-08-21 | Disposition: A | Discharge status: Home / Self Care | Payer: 59 | Source: Ambulatory Visit | Attending: Gynecologic Oncology | Admitting: Gynecologic Oncology

## 2019-08-21 DIAGNOSIS — C562 Malignant neoplasm of left ovary: Secondary | ICD-10-CM

## 2019-08-21 DIAGNOSIS — Z79899 Other long term (current) drug therapy: Secondary | ICD-10-CM | POA: Insufficient documentation

## 2019-08-21 DIAGNOSIS — Z9221 Personal history of antineoplastic chemotherapy: Secondary | ICD-10-CM | POA: Insufficient documentation

## 2019-08-21 DIAGNOSIS — E669 Obesity, unspecified: Secondary | ICD-10-CM | POA: Insufficient documentation

## 2019-08-21 DIAGNOSIS — C541 Malignant neoplasm of endometrium: Secondary | ICD-10-CM | POA: Insufficient documentation

## 2019-08-21 DIAGNOSIS — I10 Essential (primary) hypertension: Secondary | ICD-10-CM | POA: Insufficient documentation

## 2019-08-21 DIAGNOSIS — Z90722 Acquired absence of ovaries, bilateral: Secondary | ICD-10-CM | POA: Insufficient documentation

## 2019-08-21 DIAGNOSIS — N941 Unspecified dyspareunia: Secondary | ICD-10-CM | POA: Insufficient documentation

## 2019-08-21 DIAGNOSIS — Z90710 Acquired absence of both cervix and uterus: Secondary | ICD-10-CM | POA: Insufficient documentation

## 2019-08-21 LAB — BASIC METABOLIC PANEL
Anion gap: 10 (ref 5–15)
BUN: 10 mg/dL (ref 6–20)
CO2: 24 mmol/L (ref 22–32)
Calcium: 10 mg/dL (ref 8.9–10.3)
Chloride: 107 mmol/L (ref 98–111)
Creatinine, Ser: 0.78 mg/dL (ref 0.44–1.00)
GFR calc Af Amer: >60 mL/min (ref >60)
GFR calc non Af Amer: >60 mL/min (ref >60)
Glucose, Bld: 101 mg/dL — ABNORMAL HIGH (ref 70–99)
Potassium: 4.3 mmol/L (ref 3.5–5.1)
Sodium: 141 mmol/L (ref 135–145)

## 2019-08-21 MED ORDER — SODIUM CHLORIDE (PF) 0.9 % IJ SOLN
INTRAMUSCULAR | Status: AC
  Filled 2019-08-21: qty 50

## 2019-08-21 MED ORDER — IOHEXOL 300 MG/ML  SOLN
100.0000 mL | Freq: Once | INTRAMUSCULAR | Status: AC | PRN
  Administered 2019-08-21: 100 mL via INTRAVENOUS

## 2019-08-21 MED ORDER — HEPARIN SOD (PORK) LOCK FLUSH 100 UNIT/ML IV SOLN
INTRAVENOUS | Status: AC
  Administered 2019-08-21: 500 [IU]
  Filled 2019-08-21: qty 5

## 2019-08-21 MED ORDER — SODIUM CHLORIDE 0.9% FLUSH
10.0000 mL | Freq: Once | INTRAVENOUS | Status: AC
  Administered 2019-08-21: 10 mL
  Filled 2019-08-21: qty 10

## 2019-08-21 MED ORDER — HEPARIN SOD (PORK) LOCK FLUSH 100 UNIT/ML IV SOLN: 500.0000 [IU] | Freq: Once | INTRAVENOUS | Status: DC

## 2019-08-21 NOTE — Progress Notes (Signed)
Patient left accessed for CT 

## 2019-08-22 LAB — CA 125: Cancer Antigen (CA) 125: 7.1 U/mL (ref 0.0–38.1)

## 2019-08-26 ENCOUNTER — Other Ambulatory Visit: Payer: Self-pay

## 2019-08-26 ENCOUNTER — Encounter: Payer: Self-pay | Admitting: Gynecologic Oncology

## 2019-08-26 ENCOUNTER — Inpatient Hospital Stay (HOSPITAL_BASED_OUTPATIENT_CLINIC_OR_DEPARTMENT_OTHER): Payer: 59 | Admitting: Gynecologic Oncology

## 2019-08-26 VITALS — BP 120/59 | HR 64 | Temp 98.9°F | Resp 20 | Ht 63.0 in | Wt 171.6 lb

## 2019-08-26 DIAGNOSIS — C541 Malignant neoplasm of endometrium: Secondary | ICD-10-CM | POA: Diagnosis not present

## 2019-08-26 DIAGNOSIS — Z90710 Acquired absence of both cervix and uterus: Secondary | ICD-10-CM | POA: Diagnosis not present

## 2019-08-26 DIAGNOSIS — E669 Obesity, unspecified: Secondary | ICD-10-CM | POA: Diagnosis not present

## 2019-08-26 DIAGNOSIS — Z9221 Personal history of antineoplastic chemotherapy: Secondary | ICD-10-CM

## 2019-08-26 DIAGNOSIS — C562 Malignant neoplasm of left ovary: Secondary | ICD-10-CM | POA: Diagnosis not present

## 2019-08-26 DIAGNOSIS — Z90722 Acquired absence of ovaries, bilateral: Secondary | ICD-10-CM | POA: Diagnosis not present

## 2019-08-26 DIAGNOSIS — N941 Unspecified dyspareunia: Secondary | ICD-10-CM

## 2019-08-26 DIAGNOSIS — Z79899 Other long term (current) drug therapy: Secondary | ICD-10-CM | POA: Diagnosis not present

## 2019-08-26 DIAGNOSIS — I10 Essential (primary) hypertension: Secondary | ICD-10-CM | POA: Diagnosis not present

## 2019-08-26 MED ORDER — ESTRADIOL 0.1 MG/GM VA CREA: 1.0000 | TOPICAL_CREAM | VAGINAL | 12 refills | Status: DC

## 2019-08-26 NOTE — Progress Notes (Signed)
Follow-up Note: Gyn-Onc  Consult was intially requested by Dr. Barrie Dunker for the evaluation of Anastyn Ayars 49 y.o. female  CC:  Chief Complaint  Patient presents with  . Ovarian cancer on left [facility])    Follow Up   Seen with translator (spanish)  Assessment/Plan:  Ms. Twanisha Foulk  is a 49 y.o.  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 BRCA negative.  S/p completion of chemotherapy with 6 cycles adjuvant carboplatin and paclitaxel in December, 2019.   Will see her back at 3 monthly intervals with CA 125 until December, 2019.   HPI: Ms Muranda Coye is a 49 year old P2 who is seen in consultation at the request of Dr Barrie Dunker for high grade serous ovarian cancer, clinical stage IC.  The patient reports a history of having seen the emergency room in [place] in May or June 2019 when she developed left lower quadrant pain.  During the ER visit an ultrasound performed which identified an adnexal mass on the left, with no signs of torsion.  She was then seen in the offices of Dr. Barrie Dunker in June 2019 and a plan was made to proceed with surgical removal of the adnexa.  On August 23, 2017 she was taken to the operating room for a laparoscopic left salpingo-oophorectomy.  Review of the operative note suggest that intraoperative findings were significant for a 6 cm left adnexal mass that was stuck in the cul-de-sac with some paraovarian adhesions noted.  It did not appear to be malignant in appearance to the surgeons at the time of surgery.  The upper abdomen, diaphragms, and omentum were commented on as looking normal.  There was no ascites.  The left tube and ovary was removed, however there was some fragmentation of the specimen during removal, and therefore the left tube and ovary were sent as 2 specimens.  The first specimen is labeled ovary biopsy for frozen section which revealed serous  carcinoma and the second specimen is labeled left ovary on permanent this revealed serous carcinoma.  The operative note reports that the intraoperative diagnosis was borderline tumor, or reviewing the pathology report it states that the intraoperative diagnosis was left ovarian biopsy epithelial neoplasm at least borderline, cannot exclude carcinoma.  The frozen section of the second specimen was the same (epithelial neoplasm at least borderline cannot exclude carcinoma.).  The patient was seen back in the office by Dr. Barrie Dunker on September 06, 2017 and delivered her diagnosis of carcinoma.  At that time she was referred for consultation with gynecologic oncology.  Her past medical history 6 significant for obesity and hypertension.  She has had 2 prior cesarean deliveries.  She does not desire future fertility as she is completed childbearing.  She has no history of abnormal Pap smears and her last Pap was 7 to 8 months ago was normal.  Her past surgical history is significant for 2 cesarean sections and a laparoscopic cholecystectomy.  Her only family history is significant for father with prostate cancer at age 38.  On 10/02/17 she underwent robotic assisted total hysterectomy, RSO, omentectomy, bilateral pelvic lymphadenectomy (PA node dissection not feasible due to body habitus, and no suspicious nodes on preop imaging). Final pathology revealed residual carcinoma on the surface of the uterus (serosa) and separate additional endometrioid carcinoma in endometrium (stage I, thought to be second primary). Metastatic carcinoma (consistent with serous) in one of 5 left pelvic lymph  nodes (right side all benign). Omentum was benign (though atypical cells were identified in peritoneal washings).  CT chest/abd/pelvis on 09/24/17 (prior to staging surgery) showed no evidence of gross metastatic disease.  She was determined to most likely have synchronous endometrial (stage I) and ovarian (stage IIIC) cancers of  endometrioid and high grade serous histologies respectively after pathology and multidisciplinary conference review. Recommendation was for 6 cycles of adjuvant carboplatin and paclitaxel chemotherapy with genetics consultation.  She commenced chemotherapy with Dr Alvy Bimler on 10/26/17.  BRCA and HRD testing negative.   She completed 6 cycles of adjuvant carboplatin paclitaxel chemotherapy on February 12, 2018.  Posttreatment Ca1 25 was normal at 9.3.  She began developing mid right abdominal wall discomfort in November December 2019.  A CT abd/pelvis on March 13 2018 which showed a 12.3 cm left pelvic sidewall cystic structure favoring lymphocele.  This was drained by CT guided drainage on therapy for 2020.  The patient reported her symptoms improved after this time.  Ca1 25 on June 20, 2018 was normal at 8.2.  CT abd/pelvis on 11/19/18 showed no residual fluid collections and no recurrence of cancer.  She has persistent gastritis.   CT abd/pelvis on 02/26/19 showed slight enlargement of external iliac nodes (nonspecific).  She also had signs of stomach thickening and patulous duodenal bulb.   CA 125 was 6 on 02/18/19.   CA 125 was 7.1 on 05/21/19.  Interval Hx:  CA 125 was 7.1 on 08/21/19.  CT scan of the abdomen and pelvis on August 21, 2019 showed no acute findings within the abdomen and pelvis, no findings of recurrent tumor or metastatic disease, morphologically benign-appearing left external iliac lymph node measuring 9 mm that was unchanged.  Current Meds:  Outpatient Encounter Medications as of 08/26/2019  Medication Sig  . amitriptyline (ELAVIL) 10 MG tablet Take 2 tablets (20 mg total) by mouth at bedtime.  . Cholecalciferol (VITAMIN D-3) 125 MCG (5000 UT) TABS Take 10,000 Units by mouth daily.   Marland Kitchen dicyclomine (BENTYL) 10 MG capsule TAKE 1 CAPSULE BY MOUTH THREE TIMES DAILY BEFORE MEAL(S)  . estradiol (ESTRACE) 0.5 MG tablet Take 1 tablet (0.5 mg total) by mouth daily.  Marland Kitchen  lisinopril (ZESTRIL) 5 MG tablet Take 1 tablet (5 mg total) by mouth daily.  . pantoprazole (PROTONIX) 40 MG tablet Take 1 tablet by mouth once daily  . progesterone (PROMETRIUM) 200 MG capsule Take 2 capsules (400 mg total) by mouth daily.  Derrill Memo ON 08/27/2019] estradiol (ESTRACE) 0.1 MG/GM vaginal cream Place 1 Applicatorful vaginally 3 (three) times a week.  . [DISCONTINUED] ondansetron (ZOFRAN-ODT) 4 MG disintegrating tablet Take 1 tablet (4 mg total) by mouth every 8 (eight) hours as needed for nausea or vomiting. (Patient not taking: Reported on 08/26/2019)  . [DISCONTINUED] progesterone (PROMETRIUM) 200 MG capsule TAKE 1 CAPSULE BY MOUTH ONCE DAILY AT NIGHT (Patient taking differently: Take 200 mg by mouth at bedtime. )  . [DISCONTINUED] heparin lock flush 100 unit/mL    No facility-administered encounter medications on file as of 08/26/2019.    Allergy:  Allergies  Allergen Reactions  . Other Rash    Pork    Social Hx:   Social History   Socioeconomic History  . Marital status: Married    Spouse name: Media planner  . Number of children: 2  . Years of education: Not on file  . Highest education level: Not on file  Occupational History  . Occupation: unemployed  Tobacco Use  . Smoking  status: Never Smoker  . Smokeless tobacco: Never Used  Vaping Use  . Vaping Use: Never used  Substance and Sexual Activity  . Alcohol use: Never  . Drug use: Never  . Sexual activity: Not Currently    Comment: calendar  Other Topics Concern  . Not on file  Social History Narrative   Married 26 years.Lives with husband and daughter.Homemaker.   Social Determinants of Health   Financial Resource Strain:   . Difficulty of Paying Living Expenses:   Food Insecurity:   . Worried About Charity fundraiser in the Last Year:   . Arboriculturist in the Last Year:   Transportation Needs:   . Film/video editor (Medical):   Marland Kitchen Lack of Transportation (Non-Medical):   Physical Activity:   .  Days of Exercise per Week:   . Minutes of Exercise per Session:   Stress:   . Feeling of Stress :   Social Connections:   . Frequency of Communication with Friends and Family:   . Frequency of Social Gatherings with Friends and Family:   . Attends Religious Services:   . Active Member of Clubs or Organizations:   . Attends Archivist Meetings:   Marland Kitchen Marital Status:   Intimate Partner Violence:   . Fear of Current or Ex-Partner:   . Emotionally Abused:   Marland Kitchen Physically Abused:   . Sexually Abused:     Past Surgical Hx:  Past Surgical History:  Procedure Laterality Date  . BIOPSY  04/24/2019   Procedure: BIOPSY;  Surgeon: Rogene Houston, MD;  [facility];  Service: Endoscopy;;  antrum  . CESAREAN SECTION     x2  . CHOLECYSTECTOMY     20 years ago  . COLONOSCOPY N/A 09/12/2018   Procedure: COLONOSCOPY;  Surgeon: Rogene Houston, MD;  [facility];  Service: Endoscopy;  Laterality: N/A;  200  . ESOPHAGOGASTRODUODENOSCOPY N/A 04/24/2019   Procedure: ESOPHAGOGASTRODUODENOSCOPY (EGD);  Surgeon: Rogene Houston, MD;  [facility];  Service: Endoscopy;  Laterality: N/A;  125  . IR IMAGING GUIDED PORT INSERTION  10/24/2017  . OMENTECTOMY N/A 10/02/2017   Procedure: OMENTECTOMY;  Surgeon: Everitt Amber, MD;  Location: [facility];  Service: Gynecology;  Laterality: N/A;  . OTHER SURGICAL HISTORY  07/2017   Left ovary removed  . POLYPECTOMY  04/24/2019   Procedure: POLYPECTOMY;  Surgeon: Rogene Houston, MD;  [facility];  Service: Endoscopy;;  gastric  . ROBOTIC ASSISTED TOTAL HYSTERECTOMY WITH BILATERAL SALPINGO OOPHERECTOMY Right 10/02/2017   Procedure: XI ROBOTIC ASSISTED TOTAL HYSTERECTOMY WITH RIGHT SALPINGO OOPHORECTOMY;  Surgeon: Everitt Amber, MD;  Location: [facility];  Service: Gynecology;  Laterality: Right;  . ROBOTIC PELVIC AND PARA-AORTIC LYMPH NODE DISSECTION Bilateral 10/02/2017   Procedure: XI ROBOTIC PELVIC LYMPH NODE DISSECTION;   Surgeon: Everitt Amber, MD;  Location: [facility];  Service: Gynecology;  Laterality: Bilateral;    Past Medical Hx:  Past Medical History:  Diagnosis Date  . Abdominal pain    Right mid only with bowel movements  . Blood in urine   . Change in bowel movement    more green color  . Essential hypertension, benign 11/12/2018  . Family history of prostate cancer   . GERD (gastroesophageal reflux disease)   . Headache    after menstrual cycle every month  . History of gallstones   . Hot flashes due to surgical menopause 12/17/2018  . Hypertension   .  Neck pain on right side    pulsating  . Numbness and tingling of right leg   . Obesity   . Obesity (BMI 30.0-34.9) 12/17/2018  . Ovarian cancer, left ([place])   . PPD positive   . Skin rash    Chest, forehead, left arm    Past Gynecological History:  C/s x 2. No history of abnormal paps.  Patient's last menstrual period was 09/24/2017.  Family Hx:  Family History  Problem Relation Age of Onset  . Hypertension Mother   . Prostate cancer Father 100       d. 49  . Diabetes Paternal Aunt   . Heart attack Paternal Aunt   . Heart attack Paternal Grandmother 95  . Thyroid disease Other 14    Review of Systems:  Constitutional  Feels well,    ENT Normal appearing ears and nares bilaterally Skin/Breast  No rash, sores, jaundice, itching, dryness Cardiovascular  No chest pain, shortness of breath, or edema  Pulmonary  No cough or wheeze.  Gastro Intestinal  No nausea, vomitting, or diarrhoea. No bright red blood per rectum, change in bowel movement, or constipation. Genito Urinary  No frequency, urgency, dysuria, no pain, no bleeding. Musculo Skeletal  No myalgia, arthralgia, joint swelling or pain  Neurologic  No weakness, numbness, change in gait,  Psychology  No depression, anxiety, insomnia.   Vitals:  Blood pressure (!) 120/59, pulse 64, temperature 98.9 F (37.2 C), temperature source Oral, resp. rate 20, height '5\' 3"'$   (1.6 m), weight 171 lb 9.6 oz (77.8 kg), last menstrual period 09/24/2017, SpO2 100 %.  Physical Exam: WD in NAD Neck  Supple NROM, without any enlargements.  Lymph Node Survey No cervical supraclavicular or inguinal adenopathy Cardiovascular  Pulse normal rate, regularity and rhythm. S1 and S2 normal.  Lungs  Clear to auscultation bilateraly, without wheezes/crackles/rhonchi. Good air movement.  Skin  No rash/lesions/breakdown  Psychiatry  Alert and oriented to person, place, and time  Abdomen  Normoactive bowel sounds, abdomen soft, non-tender and obese without evidence of hernia. Incisions soft. Back No CVA tenderness Genito Urinary  Vaginal cuff in tact, well healed, no blood, no masses  Rectal  deferred Extremities  No bilateral cyanosis, clubbing or edema.    Thereasa Solo, MD  08/26/2019, 3:03 PM

## 2019-08-26 NOTE — Patient Instructions (Signed)
Dr Denman George is recommending vaginal estrogen cream 3 times per week for vaginal pain with intercourse.  Please return in 3 months for repeat examination and CA 125.

## 2019-08-27 ENCOUNTER — Other Ambulatory Visit (INDEPENDENT_AMBULATORY_CARE_PROVIDER_SITE_OTHER): Payer: Self-pay | Admitting: Internal Medicine

## 2019-08-27 DIAGNOSIS — K6289 Other specified diseases of anus and rectum: Secondary | ICD-10-CM

## 2019-09-08 ENCOUNTER — Ambulatory Visit (INDEPENDENT_AMBULATORY_CARE_PROVIDER_SITE_OTHER): Payer: 59 | Admitting: Internal Medicine

## 2019-09-11 ENCOUNTER — Other Ambulatory Visit: Payer: Self-pay

## 2019-09-11 ENCOUNTER — Ambulatory Visit (INDEPENDENT_AMBULATORY_CARE_PROVIDER_SITE_OTHER): Payer: 59 | Admitting: Internal Medicine

## 2019-09-11 ENCOUNTER — Encounter (INDEPENDENT_AMBULATORY_CARE_PROVIDER_SITE_OTHER): Payer: Self-pay | Admitting: Internal Medicine

## 2019-09-11 VITALS — BP 130/80 | HR 72 | Temp 97.7°F | Ht 63.0 in | Wt 170.8 lb

## 2019-09-11 DIAGNOSIS — E8941 Symptomatic postprocedural ovarian failure: Secondary | ICD-10-CM | POA: Diagnosis not present

## 2019-09-11 DIAGNOSIS — I10 Essential (primary) hypertension: Secondary | ICD-10-CM

## 2019-09-11 MED ORDER — PROGESTERONE 200 MG PO CAPS: 400.0000 mg | ORAL_CAPSULE | Freq: Every day | ORAL | 1 refills | Status: DC

## 2019-09-11 MED ORDER — ESTRADIOL 0.5 MG PO TABS: 1.0000 mg | ORAL_TABLET | Freq: Every day | ORAL | 1 refills | Status: DC

## 2019-09-11 MED ORDER — LORAZEPAM 0.5 MG PO TABS
0.5000 mg | ORAL_TABLET | Freq: Two times a day (BID) | ORAL | 0 refills | Status: DC | PRN
Start: 2019-09-11 — End: 2019-10-27

## 2019-09-11 NOTE — Progress Notes (Signed)
Metrics: Intervention Frequency ACO  Documented Smoking Status Yearly  Screened one or more times in 24 months  Cessation Counseling or  Active cessation medication Past 24 months  Past 24 months   Guideline developer: UpToDate (See UpToDate for funding source) Date Released: 2014       Wellness Office Visit  Subjective:  Patient ID: Kristy Franklin, female    DOB: [birth year]  Age: 49 y.o. MRN: [id number]  CC: This lady comes in for follow-up of hypertension and menopausal symptoms. HPI  She is on bioidentical hormone therapy with estradiol and progesterone.  These have not been optimized yet.  She also uses estradiol cream 3 times a week because of vagina dryness. She continues with lisinopril for hypertension. She describes still fatigue.  She is being followed by Dr. Denman George for ovarian cancer.  Her CA125 levels are very stable.  Past Medical History:  Diagnosis Date  . Abdominal pain    Right mid only with bowel movements  . Blood in urine   . Change in bowel movement    more green color  . Essential hypertension, benign 11/12/2018  . Family history of prostate cancer   . GERD (gastroesophageal reflux disease)   . Headache    after menstrual cycle every month  . History of gallstones   . Hot flashes due to surgical menopause 12/17/2018  . Hypertension   . Neck pain on right side    pulsating  . Numbness and tingling of right leg   . Obesity   . Obesity (BMI 30.0-34.9) 12/17/2018  . Ovarian cancer, left ([place])   . PPD positive   . Skin rash    Chest, forehead, left arm   Past Surgical History:  Procedure Laterality Date  . BIOPSY  04/24/2019   Procedure: BIOPSY;  Surgeon: Rogene Houston, MD;  [facility];  Service: Endoscopy;;  antrum  . CESAREAN SECTION     x2  . CHOLECYSTECTOMY     20 years ago  . COLONOSCOPY N/A 09/12/2018   Procedure: COLONOSCOPY;  Surgeon: Rogene Houston, MD;  [facility];  Service: Endoscopy;  Laterality: N/A;   200  . ESOPHAGOGASTRODUODENOSCOPY N/A 04/24/2019   Procedure: ESOPHAGOGASTRODUODENOSCOPY (EGD);  Surgeon: Rogene Houston, MD;  [facility];  Service: Endoscopy;  Laterality: N/A;  125  . IR IMAGING GUIDED PORT INSERTION  10/24/2017  . OMENTECTOMY N/A 10/02/2017   Procedure: OMENTECTOMY;  Surgeon: Everitt Amber, MD;  [facility];  Service: Gynecology;  Laterality: N/A;  . OTHER SURGICAL HISTORY  07/2017   Left ovary removed  . POLYPECTOMY  04/24/2019   Procedure: POLYPECTOMY;  Surgeon: Rogene Houston, MD;  [facility];  Service: Endoscopy;;  gastric  . ROBOTIC ASSISTED TOTAL HYSTERECTOMY WITH BILATERAL SALPINGO OOPHERECTOMY Right 10/02/2017   Procedure: XI ROBOTIC ASSISTED TOTAL HYSTERECTOMY WITH RIGHT SALPINGO OOPHORECTOMY;  Surgeon: Everitt Amber, MD;  [facility];  Service: Gynecology;  Laterality: Right;  . ROBOTIC PELVIC AND PARA-AORTIC LYMPH NODE DISSECTION Bilateral 10/02/2017   Procedure: XI ROBOTIC PELVIC LYMPH NODE DISSECTION;  Surgeon: Everitt Amber, MD;  [facility];  Service: Gynecology;  Laterality: Bilateral;     Family History  Problem Relation Age of Onset  . Hypertension Mother   . Prostate cancer Father 32       d. 55  . Diabetes Paternal Aunt   . Heart attack Paternal Aunt   . Heart attack Paternal Grandmother 95  . Thyroid disease  Other 75    Social History   Social History Narrative   Married 26 years.Lives with husband and daughter.Homemaker.   Social History   Tobacco Use  . Smoking status: Never Smoker  . Smokeless tobacco: Never Used  Substance Use Topics  . Alcohol use: Never    Current Meds  Medication Sig  . amitriptyline (ELAVIL) 10 MG tablet TAKE 2 TABLETS BY MOUTH AT BEDTIME  . Cholecalciferol (VITAMIN D-3) 125 MCG (5000 UT) TABS Take 10,000 Units by mouth daily.   Marland Kitchen dicyclomine (BENTYL) 10 MG capsule TAKE 1 CAPSULE BY MOUTH THREE TIMES DAILY BEFORE MEAL(S)  . estradiol (ESTRACE) 0.1 MG/GM vaginal cream Place 1  Applicatorful vaginally 3 (three) times a week.  . estradiol (ESTRACE) 0.5 MG tablet Take 2 tablets (1 mg total) by mouth daily.  Marland Kitchen lisinopril (ZESTRIL) 5 MG tablet Take 1 tablet (5 mg total) by mouth daily.  . pantoprazole (PROTONIX) 40 MG tablet Take 1 tablet by mouth once daily  . progesterone (PROMETRIUM) 200 MG capsule Take 2 capsules (400 mg total) by mouth daily.  . [DISCONTINUED] estradiol (ESTRACE) 0.5 MG tablet Take 1 tablet (0.5 mg total) by mouth daily.  . [DISCONTINUED] progesterone (PROMETRIUM) 200 MG capsule TAKE 1 CAPSULE BY MOUTH ONCE DAILY AT NIGHT (Patient taking differently: Take 200 mg by mouth at bedtime. )  . [DISCONTINUED] progesterone (PROMETRIUM) 200 MG capsule Take 2 capsules (400 mg total) by mouth daily.        No flowsheet data found.   Objective:   Today's Vitals: BP 130/80 (BP Location: Left Arm, Patient Position: Sitting, Cuff Size: Normal)   Pulse 72   Temp 97.7 F (36.5 C) (Temporal)   Ht 5\' 3"  (1.6 m)   Wt 170 lb 12.8 oz (77.5 kg)   LMP 09/24/2017   SpO2 97%   BMI 30.26 kg/m  Vitals with BMI 09/11/2019 08/26/2019 06/03/2019  Height 5\' 3"  5\' 3"  5\' 3"   Weight 170 lbs 13 oz 171 lbs 10 oz 172 lbs 6 oz  BMI 30.26 96.29 52.84  Systolic 132 440 102  Diastolic 80 59 79  Pulse 72 64 91     Physical Exam   She looks systemically well.  Blood pressure is well controlled on current therapy.  She is alert and orientated without any focal neurologic signs.    Assessment   1. Hot flashes due to surgical menopause   2. Essential hypertension, benign       Tests ordered No orders of the defined types were placed in this encounter.    Plan: 1. In terms of her menopausal symptoms, I have told her to increase her estradiol so that she is taking 1 mg daily and increase the progesterone to 400 mg at night.  Hopefully she will tolerate this and we will check levels on the next visit.  In the future, we can discuss testosterone therapy also. 2. Her  hypertension is well controlled on current therapy and she will continue with the same dose of lisinopril. 3. I am also going to give her a small quantity of lorazepam as she is going to be on an airplane and she has a degree of claustrophobia. 4. Follow-up in about 6 weeks.   Meds ordered this encounter  Medications  . progesterone (PROMETRIUM) 200 MG capsule    Sig: Take 2 capsules (400 mg total) by mouth daily.    Dispense:  180 capsule    Refill:  1  . estradiol (ESTRACE) 0.5  MG tablet    Sig: Take 2 tablets (1 mg total) by mouth daily.    Dispense:  180 tablet    Refill:  1  . LORazepam (ATIVAN) 0.5 MG tablet    Sig: Take 1 tablet (0.5 mg total) by mouth 2 (two) times daily as needed for anxiety.    Dispense:  10 tablet    Refill:  0    Yani Lal Luther Parody, MD

## 2019-10-02 ENCOUNTER — Telehealth: Payer: Self-pay | Admitting: Gynecologic Oncology

## 2019-10-02 ENCOUNTER — Inpatient Hospital Stay: Payer: 59 | Attending: Gynecologic Oncology

## 2019-10-02 DIAGNOSIS — C541 Malignant neoplasm of endometrium: Secondary | ICD-10-CM | POA: Insufficient documentation

## 2019-10-02 DIAGNOSIS — C562 Malignant neoplasm of left ovary: Secondary | ICD-10-CM | POA: Insufficient documentation

## 2019-10-02 NOTE — Telephone Encounter (Signed)
Called per 8/4 sch message . Left message for patient to call back to reschedule appt.

## 2019-10-09 ENCOUNTER — Inpatient Hospital Stay: Payer: 59

## 2019-10-09 ENCOUNTER — Other Ambulatory Visit: Payer: Self-pay

## 2019-10-09 DIAGNOSIS — C541 Malignant neoplasm of endometrium: Secondary | ICD-10-CM | POA: Diagnosis present

## 2019-10-09 DIAGNOSIS — C562 Malignant neoplasm of left ovary: Secondary | ICD-10-CM

## 2019-10-09 MED ORDER — HEPARIN SOD (PORK) LOCK FLUSH 100 UNIT/ML IV SOLN
500.0000 [IU] | Freq: Once | INTRAVENOUS | Status: AC
  Administered 2019-10-09: 500 [IU]
  Filled 2019-10-09: qty 5

## 2019-10-09 MED ORDER — SODIUM CHLORIDE 0.9% FLUSH
10.0000 mL | Freq: Once | INTRAVENOUS | Status: AC
  Administered 2019-10-09: 10 mL
  Filled 2019-10-09: qty 10

## 2019-10-23 ENCOUNTER — Ambulatory Visit (INDEPENDENT_AMBULATORY_CARE_PROVIDER_SITE_OTHER): Payer: 59 | Admitting: Internal Medicine

## 2019-10-26 ENCOUNTER — Other Ambulatory Visit (INDEPENDENT_AMBULATORY_CARE_PROVIDER_SITE_OTHER): Payer: Self-pay | Admitting: Internal Medicine

## 2019-11-17 ENCOUNTER — Inpatient Hospital Stay: Payer: 59

## 2019-11-17 ENCOUNTER — Encounter: Payer: Self-pay | Admitting: Gynecologic Oncology

## 2019-11-17 ENCOUNTER — Inpatient Hospital Stay (HOSPITAL_BASED_OUTPATIENT_CLINIC_OR_DEPARTMENT_OTHER): Payer: 59 | Admitting: Gynecologic Oncology

## 2019-11-17 ENCOUNTER — Other Ambulatory Visit: Payer: Self-pay

## 2019-11-17 ENCOUNTER — Inpatient Hospital Stay: Payer: 59 | Attending: Gynecologic Oncology

## 2019-11-17 VITALS — BP 114/48 | HR 66 | Temp 98.8°F | Resp 17 | Ht 63.0 in | Wt 175.2 lb

## 2019-11-17 DIAGNOSIS — Z90721 Acquired absence of ovaries, unilateral: Secondary | ICD-10-CM | POA: Insufficient documentation

## 2019-11-17 DIAGNOSIS — Z8543 Personal history of malignant neoplasm of ovary: Secondary | ICD-10-CM | POA: Diagnosis present

## 2019-11-17 DIAGNOSIS — Z90710 Acquired absence of both cervix and uterus: Secondary | ICD-10-CM | POA: Diagnosis not present

## 2019-11-17 DIAGNOSIS — E669 Obesity, unspecified: Secondary | ICD-10-CM | POA: Diagnosis not present

## 2019-11-17 DIAGNOSIS — Z79899 Other long term (current) drug therapy: Secondary | ICD-10-CM | POA: Insufficient documentation

## 2019-11-17 DIAGNOSIS — Z8042 Family history of malignant neoplasm of prostate: Secondary | ICD-10-CM | POA: Insufficient documentation

## 2019-11-17 DIAGNOSIS — Z8542 Personal history of malignant neoplasm of other parts of uterus: Secondary | ICD-10-CM

## 2019-11-17 DIAGNOSIS — I10 Essential (primary) hypertension: Secondary | ICD-10-CM | POA: Diagnosis not present

## 2019-11-17 DIAGNOSIS — Z9079 Acquired absence of other genital organ(s): Secondary | ICD-10-CM | POA: Insufficient documentation

## 2019-11-17 DIAGNOSIS — Z9221 Personal history of antineoplastic chemotherapy: Secondary | ICD-10-CM | POA: Diagnosis not present

## 2019-11-17 DIAGNOSIS — Z8249 Family history of ischemic heart disease and other diseases of the circulatory system: Secondary | ICD-10-CM | POA: Insufficient documentation

## 2019-11-17 DIAGNOSIS — K219 Gastro-esophageal reflux disease without esophagitis: Secondary | ICD-10-CM | POA: Diagnosis not present

## 2019-11-17 DIAGNOSIS — C562 Malignant neoplasm of left ovary: Secondary | ICD-10-CM

## 2019-11-17 DIAGNOSIS — Z8349 Family history of other endocrine, nutritional and metabolic diseases: Secondary | ICD-10-CM | POA: Diagnosis not present

## 2019-11-17 MED ORDER — SODIUM CHLORIDE 0.9% FLUSH
10.0000 mL | Freq: Once | INTRAVENOUS | Status: AC
  Administered 2019-11-17: 10 mL
  Filled 2019-11-17: qty 10

## 2019-11-17 MED ORDER — HEPARIN SOD (PORK) LOCK FLUSH 100 UNIT/ML IV SOLN
500.0000 [IU] | Freq: Once | INTRAVENOUS | Status: AC
  Administered 2019-11-17: 500 [IU]
  Filled 2019-11-17: qty 5

## 2019-11-17 NOTE — Patient Instructions (Signed)
Gua de cuidados en el hogar del dispositivo de perfusin implantable Implanted [employer] Un dispositivo de perfusin implantable es un dispositivo que se coloca debajo de la piel. Por lo general, se coloca en el pecho. Puede usarse para suministrar medicamentos intravenosos, tomar muestras de sangre o Optometrist dilisis. Puede tener un dispositivo implantable si:  Necesita administrarse un medicamento intravenoso que podra provocar una irritacin en las venas pequeas de las manos o los brazos.  Necesita medicamentos por va intravenosa a largo plazo, como antibiticos.  Necesita recibir nutricin por va intravenosa durante un largo perodo.  Debe someterse a dilisis. Si tiene un dispositivo, su mdico no tendr que recurrir a las venas de sus brazos para estos procedimientos. Puede tener algunas limitaciones ms al usar el dispositivo que si usara otro tipo de vas intravenosas a Barrister's clerk, y es probable que pueda retomar sus actividades normales cuando cure la incisin. Un dispositivo de perfusin implantable tiene dos partes principales:  Depsito. El depsito es la parte en donde se inserta la aguja para Architectural technologist los medicamentos o Merchant navy officer. El depsito es redondo. Luego de colocado, se ve como un rea pequea y elevada debajo de su piel.  Catter. El catter es un tubo delgado y flexible que conecta el depsito a una vena. Los medicamentos que se introducen en el depsito, pasan a travs del catter y luego llegan a la vena. Cmo se accede al dispositivo de perfusin? Para acceder al dispositivo:  Puede que se le coloque crema anestsica sobre la piel encima del dispositivo.  El mdico se colocar Judene Companion y guantes estriles.  La piel sobre el dispositivo se limpiar con cuidado con un jabn antisptico y se Psychiatric nurse.  Su mdico pellizcar suavemente el dispositivo e insertar una aguja dentro.  Su mdico revisar el retorno de sangre para garantizar que  el dispositivo est en la vena y no est obstruido.  Si el dispositivo debe tener accesibilidad para un suministro continuo de medicamentos (infusin constante), su mdico colocar un vendaje (venda) claro Dance movement psychotherapist donde se coloc la aguja. El vendaje y la aguja debern cambiarse todas las semanas o segn las indicaciones del mdico. Qu es el purgado? El purgado ayuda a que el dispositivo no se [place]. Siga las indicaciones del mdico acerca de cmo y cundo purgar el dispositivo. Los dispositivos de perfusin generalmente se purgan con una solucin salina o un medicamento llamado heparina. La necesidad de Corporate treasurer de cmo se use el dispositivo:  Si se [place] solo eventualmente para suministrar medicamentos o Merchant navy officer, se debe purgar el dispositivo: ? Antes y despus de la administracin de los medicamentos. ? Antes y despus de [place] extraccin de [place]. ? Como parte de una rutina de Theatre manager. Es recomendable que se purgue cada 4 o 6 semanas.  Si la infusin es constante, no necesitar limpiarlo.  Deseche las agujas y las jeringas en un contenedor para desechos destinado a objetos punzantes(recipiente para objetos punzantes). Puede comprar un recipiente para objetos punzantes en [place], o puede hacer uno usted mismo con una botella de plstico rgida y Risk manager. Durante cunto tiempo tendr [employer] dispositivo? El dispositivo puede permanecer implantado por el tiempo que el mdico considere necesario. Cuando llega el momento de retirar el dispositivo, deber someterse a [place]. Esta ciruga ser similar a la Teacher, adult education de la colocacin del dispositivo. Siga estas indicaciones en su casa:   Purgue el dispositivo como se lo haya indicado el  mdico.  Si debe recibir una infusin [place] indicaciones del mdico acerca de cmo cuidar el lugar donde tiene colocado el dispositivo. Haga lo siguiente: ? Lvese las manos  con agua y [place] antes de cambiar el apsito. Use desinfectante para manos con alcohol si no dispone de [place] y [place]. ? Cambie el vendaje como se lo haya indicado el mdico. ? Coloque vendas usadas o bolsas de infusin en una bolsa plstica. Tire esa bolsa en el contenedor de basura. ? Mantenga el vendaje que cubre la aguja limpio y seco. No deje que se moje. ? No use tijeras u objetos punzantes cerca del tubo. ? Mantenga el tubo sujetado, excepto cuando se use.  Controle TEFL teacher donde tiene colocado el dispositivo todos los das para detectar signos de infeccin. Est atento a los siguientes signos: ? Dolor, hinchazn o enrojecimiento. ? Lquido o sangre. ? Pus o mal olor.  Proteja la piel alrededor del lugar donde tiene colocado el dispositivo. ? Evite usar sostenes si los breteles rozan o Loss adjuster, chartered. ? Proteja la piel alrededor del dispositivo cuando se coloque el cinturn de seguridad. Coloque una almohadilla suave en el pecho, de ser necesario.  Dchese o bese como se lo haya indicado el mdico. Se puede mojar el lugar siempre y cuando no est recibiendo activamente una infusin.  Retome sus actividades normales como se lo haya indicado el mdico. Pregntele al mdico qu actividades son seguras para usted.  Utilice un brazalete o lleve consigo una tarjeta de alerta mdica en todo momento. Esto les permitir a los mdicos saber que tiene un dispositivo implantable en caso de Engineer, maintenance (IT). Solicite ayuda de inmediato si:  Tiene enrojecimiento, hinchazn o Management consultant donde tiene el dispositivo.  Le sale una mayor cantidad de lquido o de sangre del lugar donde tiene el dispositivo.  Tiene pus o percibe mal olor que proviene del lugar donde tiene colocado el dispositivo.  Tiene fiebre. Resumen  Por lo general, los dispositivos implantados se colocan en el pecho para un acceso intravenoso a Barrister's clerk.  Siga las indicaciones del mdico sobre cmo purgar el  dispositivo y [place] vendas (vendajes).  Cuide el rea alrededor de donde tiene colocado el dispositivo, evite la ropa que presione el rea, y preste atencin a los signos de infeccin.  Proteja la piel alrededor del dispositivo cuando se coloque el cinturn de seguridad. Coloque una almohadilla suave en el pecho, de ser necesario.  Busque ayuda de inmediato si tiene fiebre o enrojecimiento, hinchazn, dolor, drenaje o mal Editor, commissioning donde tiene el dispositivo. Esta informacin no tiene Marine scientist el consejo del mdico. Asegrese de hacerle al mdico cualquier pregunta que tenga. Document Revised: 11/16/2016 Document Reviewed: 11/16/2016 Elsevier Patient Education  2020 [employer].

## 2019-11-17 NOTE — Progress Notes (Signed)
Follow-up Note: Gyn-Onc  Consult was intially requested by Dr. Barrie Dunker for the evaluation of Allanah Mcfarland 49 y.o. female  CC:  Chief Complaint  Patient presents with  . Ovarian Cancer    follow up   Seen with translator (spanish)  Assessment/Plan:  Ms. Nury Nebergall  is a 49 y.o.  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 BRCA negative.  S/p completion of chemotherapy with 6 cycles adjuvant carboplatin and paclitaxel in December, 2019.   Will see her back at 3 monthly intervals with CA 125 until December, 2019.   HPI: Ms Taygan Connell is a 49 year old P2 who is seen in consultation at the request of Dr Barrie Dunker for high grade serous ovarian cancer, clinical stage IC.  The patient reports a history of having seen the emergency room in [place] in May or June 2019 when she developed left lower quadrant pain.  During the ER visit an ultrasound performed which identified an adnexal mass on the left, with no signs of torsion.  She was then seen in the offices of Dr. Barrie Dunker in June 2019 and a plan was made to proceed with surgical removal of the adnexa.  On August 23, 2017 she was taken to the operating room for a laparoscopic left salpingo-oophorectomy.  Review of the operative note suggest that intraoperative findings were significant for a 6 cm left adnexal mass that was stuck in the cul-de-sac with some paraovarian adhesions noted.  It did not appear to be malignant in appearance to the surgeons at the time of surgery.  The upper abdomen, diaphragms, and omentum were commented on as looking normal.  There was no ascites.  The left tube and ovary was removed, however there was some fragmentation of the specimen during removal, and therefore the left tube and ovary were sent as 2 specimens.  The first specimen is labeled ovary biopsy for frozen section which revealed serous carcinoma and the  second specimen is labeled left ovary on permanent this revealed serous carcinoma.  The operative note reports that the intraoperative diagnosis was borderline tumor, or reviewing the pathology report it states that the intraoperative diagnosis was left ovarian biopsy epithelial neoplasm at least borderline, cannot exclude carcinoma.  The frozen section of the second specimen was the same (epithelial neoplasm at least borderline cannot exclude carcinoma.).  The patient was seen back in the office by Dr. Barrie Dunker on September 06, 2017 and delivered her diagnosis of carcinoma.  At that time she was referred for consultation with gynecologic oncology.  Her past medical history 6 significant for obesity and hypertension.  She has had 2 prior cesarean deliveries.  She does not desire future fertility as she is completed childbearing.  She has no history of abnormal Pap smears and her last Pap was 7 to 8 months ago was normal.  Her past surgical history is significant for 2 cesarean sections and a laparoscopic cholecystectomy.  Her only family history is significant for father with prostate cancer at age 39.  On 10/02/17 she underwent robotic assisted total hysterectomy, RSO, omentectomy, bilateral pelvic lymphadenectomy (PA node dissection not feasible due to body habitus, and no suspicious nodes on preop imaging). Final pathology revealed residual carcinoma on the surface of the uterus (serosa) and separate additional endometrioid carcinoma in endometrium (stage I, thought to be second primary). Metastatic carcinoma (consistent with serous) in one of 5 left pelvic lymph nodes (right side  all benign). Omentum was benign (though atypical cells were identified in peritoneal washings).  CT chest/abd/pelvis on 09/24/17 (prior to staging surgery) showed no evidence of gross metastatic disease.  She was determined to most likely have synchronous endometrial (stage I) and ovarian (stage IIIC) cancers of endometrioid and high  grade serous histologies respectively after pathology and multidisciplinary conference review. Recommendation was for 6 cycles of adjuvant carboplatin and paclitaxel chemotherapy with genetics consultation.  She commenced chemotherapy with Dr Alvy Bimler on 10/26/17.  BRCA and HRD testing negative.   She completed 6 cycles of adjuvant carboplatin paclitaxel chemotherapy on February 12, 2018.  Posttreatment Ca1 25 was normal at 9.3.  She began developing mid right abdominal wall discomfort in November December 2019.  A CT abd/pelvis on March 13 2018 which showed a 12.3 cm left pelvic sidewall cystic structure favoring lymphocele.  This was drained by CT guided drainage on therapy for 2020.  The patient reported her symptoms improved after this time.  Ca1 25 on June 20, 2018 was normal at 8.2.  CT abd/pelvis on 11/19/18 showed no residual fluid collections and no recurrence of cancer.  She has persistent gastritis.   CT abd/pelvis on 02/26/19 showed slight enlargement of external iliac nodes (nonspecific).  She also had signs of stomach thickening and patulous duodenal bulb.   CA 125 was 6 on 02/18/19.   CA 125 was 7.1 on 05/21/19.  Interval Hx:  CA 125 was 7.1 on 08/21/19.  CT scan of the abdomen and pelvis on August 21, 2019 showed no acute findings within the abdomen and pelvis, no findings of recurrent tumor or metastatic disease, morphologically benign-appearing left external iliac lymph node measuring 9 mm that was unchanged.  CA 125 was pending on 11/17/19. She has chronic back pain but otherwise no other symptoms.   Current Meds:  Outpatient Encounter Medications as of 11/17/2019  Medication Sig  . amitriptyline (ELAVIL) 10 MG tablet TAKE 2 TABLETS BY MOUTH AT BEDTIME  . Cholecalciferol (VITAMIN D-3) 125 MCG (5000 UT) TABS Take 10,000 Units by mouth daily.   Marland Kitchen dicyclomine (BENTYL) 10 MG capsule TAKE 1 CAPSULE BY MOUTH THREE TIMES DAILY BEFORE MEAL(S)  . estradiol (ESTRACE) 0.1  MG/GM vaginal cream Place 1 Applicatorful vaginally 3 (three) times a week.  . estradiol (ESTRACE) 0.5 MG tablet Take 2 tablets (1 mg total) by mouth daily.  Marland Kitchen lisinopril (ZESTRIL) 5 MG tablet Take 1 tablet (5 mg total) by mouth daily.  Marland Kitchen LORazepam (ATIVAN) 0.5 MG tablet Take 1 tablet by mouth twice daily as needed for anxiety  . pantoprazole (PROTONIX) 40 MG tablet Take 1 tablet by mouth once daily  . progesterone (PROMETRIUM) 200 MG capsule Take 2 capsules (400 mg total) by mouth daily.  . [DISCONTINUED] progesterone (PROMETRIUM) 200 MG capsule TAKE 1 CAPSULE BY MOUTH ONCE DAILY AT NIGHT (Patient taking differently: Take 200 mg by mouth at bedtime. )   No facility-administered encounter medications on file as of 11/17/2019.    Allergy:  Allergies  Allergen Reactions  . Other Rash    Pork    Social Hx:   Social History   Socioeconomic History  . Marital status: Married    Spouse name: Media planner  . Number of children: 2  . Years of education: Not on file  . Highest education level: Not on file  Occupational History  . Occupation: unemployed  Tobacco Use  . Smoking status: Never Smoker  . Smokeless tobacco: Never Used  Vaping Use  . Vaping  Use: Never used  Substance and Sexual Activity  . Alcohol use: Never  . Drug use: Never  . Sexual activity: Not Currently    Comment: calendar  Other Topics Concern  . Not on file  Social History Narrative   Married 26 years.Lives with husband and daughter.Homemaker.   Social Determinants of Health   Financial Resource Strain:   . Difficulty of Paying Living Expenses: Not on file  Food Insecurity:   . Worried About Running Out of Food in the Last Year: Not on file  . Ran Out of Food in the Last Year: Not on file  Transportation Needs:   . Lack of Transportation (Medical): Not on file  . Lack of Transportation (Non-Medical): Not on file  Physical Activity:   . Days of Exercise per Week: Not on file  . Minutes of Exercise per  Session: Not on file  Stress:   . Feeling of Stress : Not on file  Social Connections:   . Frequency of Communication with Friends and Family: Not on file  . Frequency of Social Gatherings with Friends and Family: Not on file  . Attends Religious Services: Not on file  . Active Member of Clubs or Organizations: Not on file  . Attends Club or Organization Meetings: Not on file  . Marital Status: Not on file  Intimate Partner Violence:   . Fear of Current or Ex-Partner: Not on file  . Emotionally Abused: Not on file  . Physically Abused: Not on file  . Sexually Abused: Not on file    Past Surgical Hx:  Past Surgical History:  Procedure Laterality Date  . BIOPSY  04/24/2019   Procedure: BIOPSY;  Surgeon: Rehman, Najeeb U, MD;  [facility];  Service: Endoscopy;;  antrum  . CESAREAN SECTION     x2  . CHOLECYSTECTOMY     20 years ago  . COLONOSCOPY N/A 09/12/2018   Procedure: COLONOSCOPY;  Surgeon: Rehman, Najeeb U, MD;  Location: [facility];  Service: Endoscopy;  Laterality: N/A;  200  . ESOPHAGOGASTRODUODENOSCOPY N/A 04/24/2019   Procedure: ESOPHAGOGASTRODUODENOSCOPY (EGD);  Surgeon: Rehman, Najeeb U, MD;  [facility]: [facility];  Service: Endoscopy;  Laterality: N/A;  125  . IR IMAGING GUIDED PORT INSERTION  10/24/2017  . OMENTECTOMY N/A 10/02/2017   Procedure: OMENTECTOMY;  Surgeon: Rossi, Emma, MD;  Location: [facility];  Service: Gynecology;  Laterality: N/A;  . OTHER SURGICAL HISTORY  07/2017   Left ovary removed  . POLYPECTOMY  04/24/2019   Procedure: POLYPECTOMY;  Surgeon: Rehman, Najeeb U, MD;  [facility]: [facility];  Service: Endoscopy;;  gastric  . ROBOTIC ASSISTED TOTAL HYSTERECTOMY WITH BILATERAL SALPINGO OOPHERECTOMY Right 10/02/2017   Procedure: XI ROBOTIC ASSISTED TOTAL HYSTERECTOMY WITH RIGHT SALPINGO OOPHORECTOMY;  Surgeon: Rossi, Emma, MD;  Location: [facility];  Service: Gynecology;  Laterality: Right;  . ROBOTIC PELVIC AND PARA-AORTIC LYMPH NODE DISSECTION  Bilateral 10/02/2017   Procedure: XI ROBOTIC PELVIC LYMPH NODE DISSECTION;  Surgeon: Rossi, Emma, MD;  Location: [facility];  Service: Gynecology;  Laterality: Bilateral;    Past Medical Hx:  Past Medical History:  Diagnosis Date  . Abdominal pain    Right mid only with bowel movements  . Blood in urine   . Change in bowel movement    more green color  . Essential hypertension, benign 11/12/2018  . Family history of prostate cancer   . GERD (gastroesophageal reflux disease)   . Headache    after menstrual cycle every   month  . History of gallstones   . Hot flashes due to surgical menopause 12/17/2018  . Hypertension   . Neck pain on right side    pulsating  . Numbness and tingling of right leg   . Obesity   . Obesity (BMI 30.0-34.9) 12/17/2018  . Ovarian cancer, left ([place])   . PPD positive   . Skin rash    Chest, forehead, left arm    Past Gynecological History:  C/s x 2. No history of abnormal paps.  Patient's last menstrual period was 09/24/2017.  Family Hx:  Family History  Problem Relation Age of Onset  . Hypertension Mother   . Prostate cancer Father 54       d. 38  . Diabetes Paternal Aunt   . Heart attack Paternal Aunt   . Heart attack Paternal Grandmother 95  . Thyroid disease Other 14    Review of Systems:  Constitutional  Feels well,    ENT Normal appearing ears and nares bilaterally Skin/Breast  No rash, sores, jaundice, itching, dryness Cardiovascular  No chest pain, shortness of breath, or edema  Pulmonary  No cough or wheeze.  Gastro Intestinal  No nausea, vomitting, or diarrhoea. No bright red blood per rectum, change in bowel movement, or constipation. Genito Urinary  No frequency, urgency, dysuria, no pain, no bleeding. Musculo Skeletal  No myalgia, arthralgia, joint swelling or pain  Neurologic  No weakness, numbness, change in gait,  Psychology  No depression, anxiety, insomnia.   Vitals:  Blood pressure (!) 114/48, pulse 66, temperature  98.8 F (37.1 C), temperature source Tympanic, resp. rate 17, height 5' 3" (1.6 m), weight 175 lb 3.2 oz (79.5 kg), last menstrual period 09/24/2017, SpO2 99 %.  Physical Exam: WD in NAD Neck  Supple NROM, without any enlargements.  Lymph Node Survey No cervical supraclavicular or inguinal adenopathy Cardiovascular  Pulse normal rate, regularity and rhythm. S1 and S2 normal.  Lungs  Clear to auscultation bilateraly, without wheezes/crackles/rhonchi. Good air movement.  Skin  No rash/lesions/breakdown  Psychiatry  Alert and oriented to person, place, and time  Abdomen  Normoactive bowel sounds, abdomen soft, non-tender and obese without evidence of hernia. Incisions soft. Back No CVA tenderness Genito Urinary  Vaginal cuff in tact, well healed, no blood, no masses  Rectal  deferred Extremities  No bilateral cyanosis, clubbing or edema.    Thereasa Solo, MD  11/17/2019, 3:02 PM

## 2019-11-17 NOTE — Addendum Note (Signed)
Addended by: Thereasa Solo on: 11/17/2019 03:05 PM   Modules accepted: Orders

## 2019-11-18 LAB — CA 125: Cancer Antigen (CA) 125: 7.7 U/mL (ref 0.0–38.1)

## 2019-11-19 ENCOUNTER — Telehealth: Payer: Self-pay

## 2019-11-19 NOTE — Telephone Encounter (Signed)
TC to patient.  WNL ca125 results conveyed to patient.  Patient appreciative of phone call and pleased with results.  Patient will call with any questions or concerns.

## 2019-12-09 ENCOUNTER — Ambulatory Visit (INDEPENDENT_AMBULATORY_CARE_PROVIDER_SITE_OTHER): Payer: 59 | Admitting: Internal Medicine

## 2019-12-11 ENCOUNTER — Encounter (INDEPENDENT_AMBULATORY_CARE_PROVIDER_SITE_OTHER): Payer: Self-pay | Admitting: Internal Medicine

## 2019-12-11 ENCOUNTER — Ambulatory Visit (INDEPENDENT_AMBULATORY_CARE_PROVIDER_SITE_OTHER): Payer: 59 | Admitting: Internal Medicine

## 2019-12-11 ENCOUNTER — Other Ambulatory Visit: Payer: Self-pay

## 2019-12-11 VITALS — BP 131/70 | HR 69 | Temp 97.8°F | Resp 18 | Ht 65.0 in | Wt 175.0 lb

## 2019-12-11 DIAGNOSIS — H66002 Acute suppurative otitis media without spontaneous rupture of ear drum, left ear: Secondary | ICD-10-CM

## 2019-12-11 DIAGNOSIS — I10 Essential (primary) hypertension: Secondary | ICD-10-CM | POA: Diagnosis not present

## 2019-12-11 DIAGNOSIS — E8941 Symptomatic postprocedural ovarian failure: Secondary | ICD-10-CM

## 2019-12-11 MED ORDER — AMOXICILLIN-POT CLAVULANATE 875-125 MG PO TABS: 1.0000 | ORAL_TABLET | Freq: Two times a day (BID) | ORAL | 0 refills | Status: DC

## 2019-12-11 NOTE — Progress Notes (Signed)
Metrics: Intervention Frequency ACO  Documented Smoking Status Yearly  Screened one or more times in 24 months  Cessation Counseling or  Active cessation medication Past 24 months  Past 24 months   Guideline developer: UpToDate (See UpToDate for funding source) Date Released: 2014       Wellness Office Visit  Subjective:  Patient ID: Margit Batte, female    DOB: 22-Nov-1970  Age: 49 y.o. MRN: [id number]  CC: This lady comes in for follow-up of hypertension and menopausal symptoms. HPI  On the last visit, we increased the estradiol and progesterone doses and she has tolerated both these doses with good effect.  Her hot flashes have virtually disappeared. She continues on lisinopril for hypertension and she is tolerating this well. She follows with oncology with history of ovarian cancer and her CA-125 level is fairly stable. For the last 3 weeks, she has been complaining of left ear discomfort.  She denies any deafness. Past Medical History:  Diagnosis Date  . Abdominal pain    Right mid only with bowel movements  . Blood in urine   . Change in bowel movement    more green color  . Essential hypertension, benign 11/12/2018  . Family history of prostate cancer   . GERD (gastroesophageal reflux disease)   . Headache    after menstrual cycle every month  . History of gallstones   . Hot flashes due to surgical menopause 12/17/2018  . Hypertension   . Neck pain on right side    pulsating  . Numbness and tingling of right leg   . Obesity   . Obesity (BMI 30.0-34.9) 12/17/2018  . Ovarian cancer, left ([place])   . PPD positive   . Skin rash    Chest, forehead, left arm   Past Surgical History:  Procedure Laterality Date  . BIOPSY  04/24/2019   Procedure: BIOPSY;  Surgeon: Rogene Houston, MD;  [facility];  Service: Endoscopy;;  antrum  . CESAREAN SECTION     x2  . CHOLECYSTECTOMY     20 years ago  . COLONOSCOPY N/A 09/12/2018   Procedure: COLONOSCOPY;   Surgeon: Rogene Houston, MD;  [facility];  Service: Endoscopy;  Laterality: N/A;  200  . ESOPHAGOGASTRODUODENOSCOPY N/A 04/24/2019   Procedure: ESOPHAGOGASTRODUODENOSCOPY (EGD);  Surgeon: Rogene Houston, MD;  [facility];  Service: Endoscopy;  Laterality: N/A;  125  . IR IMAGING GUIDED PORT INSERTION  10/24/2017  . OMENTECTOMY N/A 10/02/2017   Procedure: OMENTECTOMY;  Surgeon: Everitt Amber, MD;  [facility];  Service: Gynecology;  Laterality: N/A;  . OTHER SURGICAL HISTORY  07/2017   Left ovary removed  . POLYPECTOMY  04/24/2019   Procedure: POLYPECTOMY;  Surgeon: Rogene Houston, MD;  [facility];  Service: Endoscopy;;  gastric  . ROBOTIC ASSISTED TOTAL HYSTERECTOMY WITH BILATERAL SALPINGO OOPHERECTOMY Right 10/02/2017   Procedure: XI ROBOTIC ASSISTED TOTAL HYSTERECTOMY WITH RIGHT SALPINGO OOPHORECTOMY;  Surgeon: Everitt Amber, MD;  [facility];  Service: Gynecology;  Laterality: Right;  . ROBOTIC PELVIC AND PARA-AORTIC LYMPH NODE DISSECTION Bilateral 10/02/2017   Procedure: XI ROBOTIC PELVIC LYMPH NODE DISSECTION;  Surgeon: Everitt Amber, MD;  [facility];  Service: Gynecology;  Laterality: Bilateral;     Family History  Problem Relation Age of Onset  . Hypertension Mother   . Prostate cancer Father 51       d. 23  . Diabetes Paternal Aunt   . Heart attack  Paternal Aunt   . Heart attack Paternal Grandmother 95  . Thyroid disease Other 68    Social History   Social History Narrative   Married 26 years.Lives with husband and daughter.Homemaker.   Social History   Tobacco Use  . Smoking status: Never Smoker  . Smokeless tobacco: Never Used  Substance Use Topics  . Alcohol use: Never    Current Meds  Medication Sig  . amitriptyline (ELAVIL) 10 MG tablet TAKE 2 TABLETS BY MOUTH AT BEDTIME  . Cholecalciferol (VITAMIN D-3) 125 MCG (5000 UT) TABS Take 10,000 Units by mouth daily.   Marland Kitchen dicyclomine (BENTYL) 10 MG capsule TAKE 1 CAPSULE  BY MOUTH THREE TIMES DAILY BEFORE MEAL(S)  . estradiol (ESTRACE) 0.1 MG/GM vaginal cream Place 1 Applicatorful vaginally 3 (three) times a week.  . estradiol (ESTRACE) 0.5 MG tablet Take 2 tablets (1 mg total) by mouth daily.  Marland Kitchen lisinopril (ZESTRIL) 5 MG tablet Take 1 tablet (5 mg total) by mouth daily.  Marland Kitchen LORazepam (ATIVAN) 0.5 MG tablet Take 1 tablet by mouth twice daily as needed for anxiety  . pantoprazole (PROTONIX) 40 MG tablet Take 1 tablet by mouth once daily  . progesterone (PROMETRIUM) 200 MG capsule Take 2 capsules (400 mg total) by mouth daily.      No flowsheet data found.   Objective:   Today's Vitals: BP 131/70 (BP Location: Right Arm, Patient Position: Sitting, Cuff Size: Normal)   Pulse 69   Temp 97.8 F (36.6 C) (Temporal)   Resp 18   Ht 5\' 5"  (1.651 m)   Wt 175 lb (79.4 kg)   LMP 09/24/2017   SpO2 96%   BMI 29.12 kg/m  Vitals with BMI 12/11/2019 11/17/2019 09/11/2019  Height 5\' 5"  5\' 3"  5\' 3"   Weight 175 lbs 175 lbs 3 oz 170 lbs 13 oz  BMI 29.12 96.29 52.84  Systolic 132 440 102  Diastolic 70 48 80  Pulse 69 66 72     Physical Exam  She looks systemically well.  Weight is stable.  Blood pressure is in good control. Examination of the left ear shows cloudiness behind the left tympanic membrane, consistent with possible infection.     Assessment   1. Essential hypertension, benign   2. Hot flashes due to surgical menopause   3. Non-recurrent acute suppurative otitis media of left ear without spontaneous rupture of tympanic membrane       Tests ordered Orders Placed This Encounter  Procedures  . Progesterone  . Estradiol  . COMPLETE METABOLIC PANEL WITH GFR     Plan: 1. She will continue estradiol 1 mg daily and progesterone for 100 mg at night.  I will check both levels today. 2. She will continue with lisinopril and we will check electrolytes today. 3. I am going to prescribe for her Augmentin for possible left otitis media.  If she  does not improve, she will let me know. 4. Follow-up in about 3 months time.   Meds ordered this encounter  Medications  . amoxicillin-clavulanate (AUGMENTIN) 875-125 MG tablet    Sig: Take 1 tablet by mouth 2 (two) times daily.    Dispense:  14 tablet    Refill:  0    Abou Sterkel Luther Parody, MD

## 2019-12-12 LAB — COMPLETE METABOLIC PANEL WITH GFR
AG Ratio: 1.7 (calc) (ref 1.0–2.5)
ALT: 18 U/L (ref 6–29)
AST: 21 U/L (ref 10–35)
Albumin: 4.7 g/dL (ref 3.6–5.1)
Alkaline phosphatase (APISO): 91 U/L (ref 37–153)
BUN: 14 mg/dL (ref 7–25)
CO2: 26 mmol/L (ref 20–32)
Calcium: 10.2 mg/dL (ref 8.6–10.4)
Chloride: 105 mmol/L (ref 98–110)
Creat: 0.75 mg/dL (ref 0.50–1.05)
GFR, Est African American: 108 mL/min/{1.73_m2} (ref >=60)
GFR, Est Non African American: 93 mL/min/{1.73_m2} (ref >=60)
Globulin: 2.7 g/dL (calc) (ref 1.9–3.7)
Glucose, Bld: 88 mg/dL (ref 65–99)
Potassium: 4.2 mmol/L (ref 3.5–5.3)
Sodium: 140 mmol/L (ref 135–146)
Total Bilirubin: 0.3 mg/dL (ref 0.2–1.2)
Total Protein: 7.4 g/dL (ref 6.1–8.1)

## 2019-12-12 LAB — ESTRADIOL: Estradiol: 53 pg/mL

## 2019-12-12 LAB — PROGESTERONE: Progesterone: 63.7 ng/mL

## 2019-12-17 ENCOUNTER — Other Ambulatory Visit (INDEPENDENT_AMBULATORY_CARE_PROVIDER_SITE_OTHER): Payer: Self-pay | Admitting: Gastroenterology

## 2019-12-17 DIAGNOSIS — K6289 Other specified diseases of anus and rectum: Secondary | ICD-10-CM

## 2019-12-23 ENCOUNTER — Other Ambulatory Visit (INDEPENDENT_AMBULATORY_CARE_PROVIDER_SITE_OTHER): Payer: Self-pay | Admitting: Gastroenterology

## 2019-12-23 DIAGNOSIS — K6289 Other specified diseases of anus and rectum: Secondary | ICD-10-CM

## 2019-12-25 ENCOUNTER — Encounter (INDEPENDENT_AMBULATORY_CARE_PROVIDER_SITE_OTHER): Payer: Self-pay | Admitting: Gastroenterology

## 2019-12-25 ENCOUNTER — Ambulatory Visit (INDEPENDENT_AMBULATORY_CARE_PROVIDER_SITE_OTHER): Payer: 59 | Admitting: Gastroenterology

## 2019-12-25 ENCOUNTER — Other Ambulatory Visit: Payer: Self-pay

## 2019-12-25 VITALS — BP 126/84 | HR 68 | Temp 98.7°F | Ht 65.0 in | Wt 175.1 lb

## 2019-12-25 DIAGNOSIS — R519 Headache, unspecified: Secondary | ICD-10-CM | POA: Diagnosis not present

## 2019-12-25 DIAGNOSIS — R110 Nausea: Secondary | ICD-10-CM

## 2019-12-25 MED ORDER — SUMATRIPTAN SUCCINATE 50 MG PO TABS: 50.0000 mg | ORAL_TABLET | ORAL | 0 refills | Status: DC | PRN

## 2019-12-25 NOTE — Patient Instructions (Signed)
Tomar Sumatriptan 50 mg cada 2 horas si tiene dolor de [place], maximo 3 tabletas Hacer seguimiento con su medico primario Tomar Zofran 1 tableta cada 8 horas si presenta nausea y/o vomito

## 2019-12-25 NOTE — Progress Notes (Signed)
Maylon Peppers, M.D. Gastroenterology & Hepatology [facility] [address] [place], [place] [address]  Primary Care Physician: Doree Albee, MD [place] [address]  I will communicate my assessment and recommendations to the referring MD via EMR. "Note: Occasional unusual wording and randomly placed punctuation marks may result from the use of speech recognition technology to transcribe this document"  Problems: 1. Nausea without vomiting 2. Headache  History of Present Illness: Kristy Franklin is a 49 y.o. 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  The patient states that since last Friday the patient presented intermittent episodes of headache in the left side of her head and also in the frontal area which she described as a pressure.  Endorses having nausea without vomiting and episodes of hunger.  She has been able to tolerate diet without any problem.  Has not presented any vomiting episode but has not taken any Zofran yet.  She is taking Tylenol a couple of times with improvement of her headache but it came back so she took Aleve without definite resolution.  Denies having any visual abnormalities or gait problems, no weakness or paresis.  Notably, she also endorses that around the same time she presented itchiness around the anal area.  She used an ointment that was prescribed in the past (could not remember the name of the medication), after recently for 2 days the itching went away and has not recurred.  Notably, the patient traveled to [place] 3 months ago.  The patient denies having any fever, hematochezia, melena, hematemesis, abdominal distention, abdominal pain, diarrhea, jaundice, pruritus or weight loss.  Last EGD: 04/24/2019 - multiple gastric polyps (5 fundic gland polyps removed), gastric biopsies  showed mild gastritis neg HP Last Colonoscopy: 2020 - diverticulosis, hemorrhoids  Past Medical History: Past Medical History:  Diagnosis Date  . Abdominal pain    Right mid only with bowel movements  . Blood in urine   . Change in bowel movement    more green color  . Essential hypertension, benign 11/12/2018  . Family history of prostate cancer   . GERD (gastroesophageal reflux disease)   . Headache    after menstrual cycle every month  . History of gallstones   . Hot flashes due to surgical menopause 12/17/2018  . Hypertension   . Neck pain on right side    pulsating  . Numbness and tingling of right leg   . Obesity   . Obesity (BMI 30.0-34.9) 12/17/2018  . Ovarian cancer, left ([place])   . PPD positive   . Skin rash    Chest, forehead, left arm    Past Surgical History: Past Surgical History:  Procedure Laterality Date  . BIOPSY  04/24/2019   Procedure: BIOPSY;  Surgeon: Rogene Houston, MD;  [facility];  Service: Endoscopy;;  antrum  . CESAREAN SECTION     x2  . CHOLECYSTECTOMY     20 years ago  . COLONOSCOPY N/A 09/12/2018   Procedure: COLONOSCOPY;  Surgeon: Rogene Houston, MD;  [facility];  Service: Endoscopy;  Laterality: N/A;  200  . ESOPHAGOGASTRODUODENOSCOPY N/A 04/24/2019   Procedure: ESOPHAGOGASTRODUODENOSCOPY (EGD);  Surgeon: Rogene Houston, MD;  [facility];  Service: Endoscopy;  Laterality: N/A;  125  . IR IMAGING GUIDED PORT INSERTION  10/24/2017  . OMENTECTOMY N/A 10/02/2017  Procedure: OMENTECTOMY;  Surgeon: Everitt Amber, MD;  [facility];  Service: Gynecology;  Laterality: N/A;  . OTHER SURGICAL HISTORY  07/2017   Left ovary removed  . POLYPECTOMY  04/24/2019   Procedure: POLYPECTOMY;  Surgeon: Rogene Houston, MD;  [facility];  Service: Endoscopy;;  gastric  . ROBOTIC ASSISTED TOTAL HYSTERECTOMY WITH BILATERAL SALPINGO OOPHERECTOMY Right 10/02/2017   Procedure: XI ROBOTIC ASSISTED TOTAL  HYSTERECTOMY WITH RIGHT SALPINGO OOPHORECTOMY;  Surgeon: Everitt Amber, MD;  [facility];  Service: Gynecology;  Laterality: Right;  . ROBOTIC PELVIC AND PARA-AORTIC LYMPH NODE DISSECTION Bilateral 10/02/2017   Procedure: XI ROBOTIC PELVIC LYMPH NODE DISSECTION;  Surgeon: Everitt Amber, MD;  [facility];  Service: Gynecology;  Laterality: Bilateral;    Family History: Family History  Problem Relation Age of Onset  . Hypertension Mother   . Prostate cancer Father 29       d. 43  . Diabetes Paternal Aunt   . Heart attack Paternal Aunt   . Heart attack Paternal Grandmother 95  . Thyroid disease Other 14    Social History: Social History   Tobacco Use  Smoking Status Never Smoker  Smokeless Tobacco Never Used   Social History   Substance and Sexual Activity  Alcohol Use Never   Social History   Substance and Sexual Activity  Drug Use Never    Allergies: Allergies  Allergen Reactions  . Other Rash    Pork    Medications: Current Outpatient Medications  Medication Sig Dispense Refill  . amitriptyline (ELAVIL) 10 MG tablet TAKE 2 TABLETS BY MOUTH AT BEDTIME 60 tablet 0  . Cholecalciferol (VITAMIN D-3) 125 MCG (5000 UT) TABS Take 10,000 Units by mouth daily.     Marland Kitchen estradiol (ESTRACE) 0.5 MG tablet Take 2 tablets (1 mg total) by mouth daily. 180 tablet 1  . lisinopril (ZESTRIL) 5 MG tablet Take 1 tablet (5 mg total) by mouth daily. 90 tablet 0  . pantoprazole (PROTONIX) 40 MG tablet Take 1 tablet by mouth once daily 90 tablet 3  . progesterone (PROMETRIUM) 200 MG capsule Take 2 capsules (400 mg total) by mouth daily. 180 capsule 1  . dicyclomine (BENTYL) 10 MG capsule TAKE 1 CAPSULE BY MOUTH THREE TIMES DAILY BEFORE MEAL(S) (Patient not taking: Reported on 12/25/2019) 90 capsule 3  . estradiol (ESTRACE) 0.1 MG/GM vaginal cream Place 1 Applicatorful vaginally 3 (three) times a week. (Patient not taking: Reported on 12/25/2019) 42.5 g 12   No current  facility-administered medications for this visit.    Review of Systems: GENERAL: negative for malaise, night sweats HEENT: No changes in hearing or vision, no nose bleeds or other nasal problems. NECK: Negative for lumps, goiter, pain and significant neck swelling RESPIRATORY: Negative for cough, wheezing CARDIOVASCULAR: Negative for chest pain, leg swelling, palpitations, orthopnea GI: SEE HPI MUSCULOSKELETAL: Negative for joint pain or swelling, back pain, and muscle pain. SKIN: Negative for lesions, rash PSYCH: Negative for sleep disturbance, mood disorder and recent psychosocial stressors. HEMATOLOGY Negative for prolonged bleeding, bruising easily, and swollen nodes. ENDOCRINE: Negative for cold or heat intolerance, polyuria, polydipsia and goiter. NEURO: negative for tremor, gait imbalance, syncope and seizures. The remainder of the review of systems is noncontributory.   Physical Exam: BP 126/84 (BP Location: Right Arm, Patient Position: Sitting, Cuff Size: Large)   Pulse 68   Temp 98.7 F (37.1 C) (Oral)   Ht 5\' 5"  (1.651 m)   Wt 175 lb 1.6 oz (79.4 kg)  LMP 09/24/2017   BMI 29.14 kg/m  GENERAL: The patient is AO x3, in no acute distress. HEENT: Head is normocephalic and atraumatic. EOMI are intact. Mouth is well hydrated and without lesions. NECK: Supple. No masses LUNGS: Clear to auscultation. No presence of rhonchi/wheezing/rales. Adequate chest expansion HEART: RRR, normal s1 and s2. ABDOMEN: Soft, nontender, no guarding, no peritoneal signs, and nondistended. BS +. No masses. EXTREMITIES: Without any cyanosis, clubbing, rash, lesions or edema. NEUROLOGIC: AOx3, no focal motor deficit. SKIN: no jaundice, no rashes  Imaging/Labs: as above  I personally reviewed and interpreted the available labs, imaging and endoscopic files.  Impression and Plan: Kristy Franklin is a 49 y.o. 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  Regarding her nausea and headache, the patient had recent symptoms without presence of any other gastrointestinal complaints.  This could be related to episode of migraine for which I provided sumatriptan and I advised the patient to take Zofran as needed to improve her nausea.  She will need to follow-up with her PCP regarding her headache symptoms.  There is no need for any endoscopic evaluation as she had this done recently and did not show anything concerning that would explain her current presentation.  Finally, her anal itching has resolved with an ointment, it is unclear if this is related to irritation in this area that was transient.  If the symptoms recur, consideration for possible Enterobius infection needs to be evaluated with tape test as she recently traveled to [place].  -Take sumatriptan 50 mg every 2 hours as needed for headache - Zofran as needed every 8 hours for nausea and or vomiting -Follow-up with PCP regarding headache  All questions were answered.      Harvel Quale, MD Gastroenterology and Hepatology [facility]

## 2019-12-30 ENCOUNTER — Inpatient Hospital Stay: Payer: 59 | Attending: Gynecologic Oncology

## 2019-12-30 ENCOUNTER — Other Ambulatory Visit: Payer: Self-pay

## 2019-12-30 DIAGNOSIS — C562 Malignant neoplasm of left ovary: Secondary | ICD-10-CM | POA: Insufficient documentation

## 2019-12-30 MED ORDER — SODIUM CHLORIDE 0.9% FLUSH
10.0000 mL | Freq: Once | INTRAVENOUS | Status: AC
  Administered 2019-12-30: 10 mL
  Filled 2019-12-30: qty 10

## 2019-12-30 MED ORDER — HEPARIN SOD (PORK) LOCK FLUSH 100 UNIT/ML IV SOLN
500.0000 [IU] | Freq: Once | INTRAVENOUS | Status: AC
  Administered 2019-12-30: 500 [IU]
  Filled 2019-12-30: qty 5

## 2020-01-01 ENCOUNTER — Other Ambulatory Visit (INDEPENDENT_AMBULATORY_CARE_PROVIDER_SITE_OTHER): Payer: Self-pay | Admitting: Gastroenterology

## 2020-01-01 MED ORDER — HYDROCORTISONE (PERIANAL) 2.5 % EX CREA: 1.0000 "application " | TOPICAL_CREAM | Freq: Two times a day (BID) | CUTANEOUS | 0 refills | Status: DC | PRN

## 2020-01-01 NOTE — Progress Notes (Signed)
Refill of proctozone cream sent to pharmacy per request

## 2020-02-12 ENCOUNTER — Telehealth: Payer: Self-pay

## 2020-02-12 NOTE — Telephone Encounter (Signed)
TC via [employer].  No answer, left message to return call.

## 2020-02-13 ENCOUNTER — Inpatient Hospital Stay: Payer: 59

## 2020-02-13 ENCOUNTER — Encounter: Payer: Self-pay | Admitting: Gynecologic Oncology

## 2020-02-13 ENCOUNTER — Other Ambulatory Visit: Payer: Self-pay

## 2020-02-13 ENCOUNTER — Inpatient Hospital Stay: Payer: 59 | Attending: Gynecologic Oncology

## 2020-02-13 ENCOUNTER — Inpatient Hospital Stay (HOSPITAL_BASED_OUTPATIENT_CLINIC_OR_DEPARTMENT_OTHER): Payer: 59 | Admitting: Gynecologic Oncology

## 2020-02-13 VITALS — BP 126/68 | HR 67 | Temp 98.8°F | Resp 16 | Ht 65.0 in | Wt 175.6 lb

## 2020-02-13 DIAGNOSIS — Z90710 Acquired absence of both cervix and uterus: Secondary | ICD-10-CM

## 2020-02-13 DIAGNOSIS — G8929 Other chronic pain: Secondary | ICD-10-CM | POA: Diagnosis not present

## 2020-02-13 DIAGNOSIS — M549 Dorsalgia, unspecified: Secondary | ICD-10-CM | POA: Insufficient documentation

## 2020-02-13 DIAGNOSIS — Z79899 Other long term (current) drug therapy: Secondary | ICD-10-CM | POA: Diagnosis not present

## 2020-02-13 DIAGNOSIS — C541 Malignant neoplasm of endometrium: Secondary | ICD-10-CM | POA: Diagnosis not present

## 2020-02-13 DIAGNOSIS — C562 Malignant neoplasm of left ovary: Secondary | ICD-10-CM | POA: Diagnosis present

## 2020-02-13 DIAGNOSIS — Z9221 Personal history of antineoplastic chemotherapy: Secondary | ICD-10-CM | POA: Insufficient documentation

## 2020-02-13 DIAGNOSIS — Z90722 Acquired absence of ovaries, bilateral: Secondary | ICD-10-CM

## 2020-02-13 DIAGNOSIS — K2970 Gastritis, unspecified, without bleeding: Secondary | ICD-10-CM | POA: Diagnosis not present

## 2020-02-13 MED ORDER — SODIUM CHLORIDE 0.9% FLUSH
10.0000 mL | Freq: Once | INTRAVENOUS | Status: AC
  Administered 2020-02-13: 13:00:00 10 mL
  Filled 2020-02-13: qty 10

## 2020-02-13 MED ORDER — HEPARIN SOD (PORK) LOCK FLUSH 100 UNIT/ML IV SOLN
500.0000 [IU] | Freq: Once | INTRAVENOUS | Status: AC
  Administered 2020-02-13: 13:00:00 500 [IU]
  Filled 2020-02-13: qty 5

## 2020-02-13 NOTE — Progress Notes (Signed)
Follow-up Note: Gyn-Onc  Consult was intially requested by Dr. Mora Appl for the evaluation of Kristy Franklin 49 y.o. female  CC:  Chief Complaint  Patient presents with  . Ovarian cancer on left (HCC)  . Endometrial cancer   Seen with translator (spanish)  Assessment/Plan:  [name]  is a 49 y.o.  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 BRCA negative.  S/p completion of chemotherapy with 6 cycles adjuvant carboplatin and paclitaxel in December, 2019.   Will see her back at 6 monthly intervals with CA 125 until December, 2022.   HPI: [name] is a 49 year old P2 who is seen in consultation at the request of Dr Mora Appl for high grade serous ovarian cancer, clinical stage IC.  The patient reports a history of having seen the emergency room in [place] in May or June 2019 when she developed left lower quadrant pain.  During the ER visit an ultrasound performed which identified an adnexal mass on the left, with no signs of torsion.  She was then seen in the offices of Dr. Mora Appl in June 2019 and a plan was made to proceed with surgical removal of the adnexa.  On August 23, 2017 she was taken to the operating room for a laparoscopic left salpingo-oophorectomy.  Review of the operative note suggest that intraoperative findings were significant for a 6 cm left adnexal mass that was stuck in the cul-de-sac with some paraovarian adhesions noted.  It did not appear to be malignant in appearance to the surgeons at the time of surgery.  The upper abdomen, diaphragms, and omentum were commented on as looking normal.  There was no ascites.  The left tube and ovary was removed, however there was some fragmentation of the specimen during removal, and therefore the left tube and ovary were sent as 2 specimens.  The first specimen is labeled ovary biopsy for frozen section which  revealed serous carcinoma and the second specimen is labeled left ovary on permanent this revealed serous carcinoma.  The operative note reports that the intraoperative diagnosis was borderline tumor, or reviewing the pathology report it states that the intraoperative diagnosis was left ovarian biopsy epithelial neoplasm at least borderline, cannot exclude carcinoma.  The frozen section of the second specimen was the same (epithelial neoplasm at least borderline cannot exclude carcinoma.).  The patient was seen back in the office by Dr. Mora Appl on September 06, 2017 and delivered her diagnosis of carcinoma.  At that time she was referred for consultation with gynecologic oncology.  CT chest/abd/pelvis on 09/24/17 (prior to staging surgery) showed no evidence of gross metastatic disease.  CA 125 on 09/25/17 was elevated at 66.4.  On 10/02/17 she underwent robotic assisted total hysterectomy, RSO, omentectomy, bilateral pelvic lymphadenectomy (PA node dissection not feasible due to body habitus, and no suspicious nodes on preop imaging). Final pathology revealed residual carcinoma on the surface of the uterus (serosa) and separate additional endometrioid carcinoma in endometrium (stage I, thought to be second primary). Metastatic carcinoma (consistent with serous) in one of 5 left pelvic lymph nodes (right side all benign). Omentum was benign (though atypical cells were identified in peritoneal washings).  She was determined to most likely have synchronous endometrial (stage I) and ovarian (stage IIIC) cancers of endometrioid and high grade serous histologies respectively after pathology and multidisciplinary conference review. Recommendation was for 6 cycles of adjuvant carboplatin  and paclitaxel chemotherapy with genetics consultation.  She commenced chemotherapy with Dr Alvy Bimler on 10/26/17.  BRCA and HRD testing negative.   She completed 6 cycles of adjuvant carboplatin paclitaxel chemotherapy on February 12, 2018.  Posttreatment Ca1 25 was normal at 9.3.  She began developing mid right abdominal wall discomfort in November December 2019.  A CT abd/pelvis on March 13 2018 which showed a 12.3 cm left pelvic sidewall cystic structure favoring lymphocele.  This was drained by CT guided drainage on therapy for 2020.  The patient reported her symptoms improved after this time.  Ca1 25 on June 20, 2018 was normal at 8.2.  CT abd/pelvis on 11/19/18 showed no residual fluid collections and no recurrence of cancer.  She has persistent gastritis.   CT abd/pelvis on 02/26/19 showed slight enlargement of external iliac nodes (nonspecific).  She also had signs of stomach thickening and patulous duodenal bulb.   CA 125 was 6 on 02/18/19.   CA 125 was 7.1 on 05/21/19.  CA 125 was 7.1 on 08/21/19.  CT scan of the abdomen and pelvis on August 21, 2019 showed no acute findings within the abdomen and pelvis, no findings of recurrent tumor or metastatic disease, morphologically benign-appearing left external iliac lymph node measuring 9 mm that was unchanged.  Interval Hx:  CA 125 was 7.7 on 11/17/19.  She has chronic back pain but otherwise no other symptoms.   CA 125 was pending on 02/13/20.   Current Meds:  Outpatient Encounter Medications as of 02/13/2020  Medication Sig  . amitriptyline (ELAVIL) 10 MG tablet TAKE 2 TABLETS BY MOUTH AT BEDTIME  . Cholecalciferol (VITAMIN D-3) 125 MCG (5000 UT) TABS Take 10,000 Units by mouth daily.   Marland Kitchen dicyclomine (BENTYL) 10 MG capsule TAKE 1 CAPSULE BY MOUTH THREE TIMES DAILY BEFORE MEAL(S)  . estradiol (ESTRACE) 0.1 MG/GM vaginal cream Place 1 Applicatorful vaginally 3 (three) times a week.  . estradiol (ESTRACE) 0.5 MG tablet Take 2 tablets (1 mg total) by mouth daily.  . hydrocortisone (PROCTOZONE-HC) 2.5 % rectal cream Place 1 application rectally 2 (two) times daily as needed for hemorrhoids or anal itching.  Marland Kitchen lisinopril (ZESTRIL) 5 MG tablet Take 1 tablet  (5 mg total) by mouth daily.  . pantoprazole (PROTONIX) 40 MG tablet Take 1 tablet by mouth once daily  . progesterone (PROMETRIUM) 200 MG capsule Take 2 capsules (400 mg total) by mouth daily.  . SUMAtriptan (IMITREX) 50 MG tablet Take 1 tablet (50 mg total) by mouth every 2 (two) hours as needed for migraine. May repeat in 2 hours if headache persists or recurs.  . [DISCONTINUED] progesterone (PROMETRIUM) 200 MG capsule TAKE 1 CAPSULE BY MOUTH ONCE DAILY AT NIGHT (Patient taking differently: Take 200 mg by mouth at bedtime.)   No facility-administered encounter medications on file as of 02/13/2020.    Allergy:  Allergies  Allergen Reactions  . Other Rash    Pork    Social Hx:   Social History   Socioeconomic History  . Marital status: Married    Spouse name: Media planner  . Number of children: 2  . Years of education: Not on file  . Highest education level: Not on file  Occupational History  . Occupation: unemployed  Tobacco Use  . Smoking status: Never Smoker  . Smokeless tobacco: Never Used  Vaping Use  . Vaping Use: Never used  Substance and Sexual Activity  . Alcohol use: Never  . Drug use: Never  . Sexual activity:  Not Currently    Comment: calendar  Other Topics Concern  . Not on file  Social History Narrative   Married 26 years.Lives with husband and daughter.Homemaker.   Social Determinants of Health   Financial Resource Strain: Not on file  Food Insecurity: Not on file  Transportation Needs: Not on file  Physical Activity: Not on file  Stress: Not on file  Social Connections: Not on file  Intimate Partner Violence: Not on file    Past Surgical Hx:  Past Surgical History:  Procedure Laterality Date  . BIOPSY  04/24/2019   Procedure: BIOPSY;  Surgeon: Rogene Houston, MD;  [facility];  Service: Endoscopy;;  antrum  . CESAREAN SECTION     x2  . CHOLECYSTECTOMY     20 years ago  . COLONOSCOPY N/A 09/12/2018   Procedure: COLONOSCOPY;   Surgeon: Rogene Houston, MD;  [facility];  Service: Endoscopy;  Laterality: N/A;  200  . ESOPHAGOGASTRODUODENOSCOPY N/A 04/24/2019   Procedure: ESOPHAGOGASTRODUODENOSCOPY (EGD);  Surgeon: Rogene Houston, MD;  [facility];  Service: Endoscopy;  Laterality: N/A;  125  . IR IMAGING GUIDED PORT INSERTION  10/24/2017  . OMENTECTOMY N/A 10/02/2017   Procedure: OMENTECTOMY;  Surgeon: Everitt Amber, MD;  [facility]: [facility];  Service: Gynecology;  Laterality: N/A;  . OTHER SURGICAL HISTORY  07/2017   Left ovary removed  . POLYPECTOMY  04/24/2019   Procedure: POLYPECTOMY;  Surgeon: Rogene Houston, MD;  [facility];  Service: Endoscopy;;  gastric  . ROBOTIC ASSISTED TOTAL HYSTERECTOMY WITH BILATERAL SALPINGO OOPHERECTOMY Right 10/02/2017   Procedure: XI ROBOTIC ASSISTED TOTAL HYSTERECTOMY WITH RIGHT SALPINGO OOPHORECTOMY;  Surgeon: Everitt Amber, MD;  [facility]: [facility];  Service: Gynecology;  Laterality: Right;  . ROBOTIC PELVIC AND PARA-AORTIC LYMPH NODE DISSECTION Bilateral 10/02/2017   Procedure: XI ROBOTIC PELVIC LYMPH NODE DISSECTION;  Surgeon: Everitt Amber, MD;  [facility]: [facility];  Service: Gynecology;  Laterality: Bilateral;    Past Medical Hx:  Past Medical History:  Diagnosis Date  . Abdominal pain    Right mid only with bowel movements  . Blood in urine   . Change in bowel movement    more green color  . Essential hypertension, benign 11/12/2018  . Family history of prostate cancer   . GERD (gastroesophageal reflux disease)   . Headache    after menstrual cycle every month  . History of gallstones   . Hot flashes due to surgical menopause 12/17/2018  . Hypertension   . Neck pain on right side    pulsating  . Numbness and tingling of right leg   . Obesity   . Obesity (BMI 30.0-34.9) 12/17/2018  . Ovarian cancer, left ([place])   . PPD positive   . Skin rash    Chest, forehead, left arm    Past Gynecological History:  C/s x 2. No history of abnormal paps.   Patient's last menstrual period was 09/24/2017.  Family Hx:  Family History  Problem Relation Age of Onset  . Hypertension Mother   . Prostate cancer Father 8       d. 24  . Diabetes Paternal Aunt   . Heart attack Paternal Aunt   . Heart attack Paternal Grandmother 95  . Thyroid disease Other 14    Review of Systems:  Constitutional  Feels well,    ENT Normal appearing ears and nares bilaterally Skin/Breast  No rash, sores, jaundice, itching, dryness Cardiovascular  No chest  pain, shortness of breath, or edema  Pulmonary  No cough or wheeze.  Gastro Intestinal  No nausea, vomitting, or diarrhoea. No bright red blood per rectum, change in bowel movement, or constipation. Genito Urinary  No frequency, urgency, dysuria, no pain, no bleeding. Musculo Skeletal  No myalgia, arthralgia, joint swelling or pain  Neurologic  No weakness, numbness, change in gait,  Psychology  No depression, anxiety, insomnia.   Vitals:  Blood pressure 126/68, pulse 67, temperature 98.8 F (37.1 C), temperature source Tympanic, resp. rate 16, height $RemoveBe'5\' 5"'fuBsLhmWU$  (1.651 m), weight 175 lb 9.6 oz (79.7 kg), last menstrual period 09/24/2017, SpO2 100 %.  Physical Exam: WD in NAD Neck  Supple NROM, without any enlargements.  Lymph Node Survey No cervical supraclavicular or inguinal adenopathy Cardiovascular  Pulse normal rate, regularity and rhythm. S1 and S2 normal.  Lungs  Clear to auscultation bilateraly, without wheezes/crackles/rhonchi. Good air movement.  Skin  No rash/lesions/breakdown  Psychiatry  Alert and oriented to person, place, and time  Abdomen  Normoactive bowel sounds, abdomen soft, non-tender and obese without evidence of hernia. Incisions soft. Back No CVA tenderness Genito Urinary  Vaginal cuff in tact, well healed, no blood, no masses  Rectal  deferred Extremities  No bilateral cyanosis, clubbing or edema.    Thereasa Solo, MD  02/13/2020, 2:35 PM

## 2020-02-13 NOTE — Patient Instructions (Signed)
Please notify Dr Denman George at phone number [phone] if you notice vaginal bleeding, new pelvic or abdominal pains, bloating, feeling full easy, or a change in bladder or bowel function.   Please return to see Dr Denman George in 6 months.  Please notify your general primary care doctor, Dr Anastasio Champion, regarding your back pain as they may want to refer you to a specialist for this. Your back pain does not appear to be related to your cancer.

## 2020-02-14 LAB — CA 125: Cancer Antigen (CA) 125: 7.1 U/mL (ref 0.0–38.1)

## 2020-02-17 ENCOUNTER — Telehealth: Payer: Self-pay

## 2020-02-17 NOTE — Telephone Encounter (Signed)
Told Kristy Franklin that her CA-125 was WNL and stable at 7.1. Pt verbalized understanding.

## 2020-03-08 ENCOUNTER — Other Ambulatory Visit (INDEPENDENT_AMBULATORY_CARE_PROVIDER_SITE_OTHER): Payer: Self-pay

## 2020-03-08 ENCOUNTER — Other Ambulatory Visit (INDEPENDENT_AMBULATORY_CARE_PROVIDER_SITE_OTHER): Payer: Self-pay | Admitting: Internal Medicine

## 2020-03-08 DIAGNOSIS — K6289 Other specified diseases of anus and rectum: Secondary | ICD-10-CM

## 2020-03-08 DIAGNOSIS — I10 Essential (primary) hypertension: Secondary | ICD-10-CM

## 2020-03-08 MED ORDER — PANTOPRAZOLE SODIUM 40 MG PO TBEC: 40.0000 mg | DELAYED_RELEASE_TABLET | Freq: Every day | ORAL | 3 refills | Status: DC

## 2020-03-08 MED ORDER — AMITRIPTYLINE HCL 10 MG PO TABS: 20.0000 mg | ORAL_TABLET | Freq: Every day | ORAL | 5 refills | Status: DC

## 2020-03-08 NOTE — Telephone Encounter (Signed)
Last seen by Dr. Jenetta Downer 12/25/2019 for N& V.

## 2020-03-24 ENCOUNTER — Ambulatory Visit (INDEPENDENT_AMBULATORY_CARE_PROVIDER_SITE_OTHER): Payer: 59 | Admitting: Internal Medicine

## 2020-04-15 ENCOUNTER — Ambulatory Visit (INDEPENDENT_AMBULATORY_CARE_PROVIDER_SITE_OTHER): Payer: 59 | Admitting: Internal Medicine

## 2020-04-22 ENCOUNTER — Ambulatory Visit (INDEPENDENT_AMBULATORY_CARE_PROVIDER_SITE_OTHER): Payer: 59 | Admitting: Internal Medicine

## 2020-04-22 ENCOUNTER — Encounter (INDEPENDENT_AMBULATORY_CARE_PROVIDER_SITE_OTHER): Payer: Self-pay | Admitting: Internal Medicine

## 2020-04-22 ENCOUNTER — Other Ambulatory Visit: Payer: Self-pay

## 2020-04-22 VITALS — BP 130/71 | HR 71 | Temp 97.8°F | Resp 18 | Ht 65.0 in | Wt 178.0 lb

## 2020-04-22 DIAGNOSIS — E8941 Symptomatic postprocedural ovarian failure: Secondary | ICD-10-CM | POA: Diagnosis not present

## 2020-04-22 DIAGNOSIS — I10 Essential (primary) hypertension: Secondary | ICD-10-CM

## 2020-04-22 DIAGNOSIS — E010 Iodine-deficiency related diffuse (endemic) goiter: Secondary | ICD-10-CM

## 2020-04-22 DIAGNOSIS — E559 Vitamin D deficiency, unspecified: Secondary | ICD-10-CM | POA: Diagnosis not present

## 2020-04-22 MED ORDER — PROGESTERONE 200 MG PO CAPS: 400.0000 mg | ORAL_CAPSULE | Freq: Every day | ORAL | 1 refills | Status: DC

## 2020-04-22 MED ORDER — ESTRADIOL 1 MG PO TABS: 1.0000 mg | ORAL_TABLET | Freq: Every day | ORAL | 1 refills | Status: DC

## 2020-04-22 MED ORDER — LISINOPRIL 5 MG PO TABS: 5.0000 mg | ORAL_TABLET | Freq: Every day | ORAL | 1 refills | Status: DC

## 2020-04-22 NOTE — Progress Notes (Signed)
Metrics: Intervention Frequency ACO  Documented Smoking Status Yearly  Screened one or more times in 24 months  Cessation Counseling or  Active cessation medication Past 24 months  Past 24 months   Guideline developer: UpToDate (See UpToDate for funding source) Date Released: 2014       Wellness Office Visit  Subjective:  Patient ID: Kristy Franklin, female    DOB: May 02, 1970  Age: 50 y.o. MRN: [id number]  CC: This lady comes in for follow-up of hypertension, postmenopausal symptoms, vitamin D deficiency. HPI  She is doing reasonably well and continues on estradiol 1 mg daily and progesterone 400 mg at night. Today she is complaining of sensation in her throat/neck in the midline.  She wonders if there is some kind of swelling in this area. She continues on lisinopril for hypertension and is tolerating it well. Past Medical History:  Diagnosis Date  . Abdominal pain    Right mid only with bowel movements  . Blood in urine   . Change in bowel movement    more green color  . Essential hypertension, benign 11/12/2018  . Family history of prostate cancer   . GERD (gastroesophageal reflux disease)   . Headache    after menstrual cycle every month  . History of gallstones   . Hot flashes due to surgical menopause 12/17/2018  . Hypertension   . Neck pain on right side    pulsating  . Numbness and tingling of right leg   . Obesity   . Obesity (BMI 30.0-34.9) 12/17/2018  . Ovarian cancer, left ([place])   . PPD positive   . Skin rash    Chest, forehead, left arm   Past Surgical History:  Procedure Laterality Date  . BIOPSY  04/24/2019   Procedure: BIOPSY;  Surgeon: Rogene Houston, MD;  [facility];  Service: Endoscopy;;  antrum  . CESAREAN SECTION     x2  . CHOLECYSTECTOMY     20 years ago  . COLONOSCOPY N/A 09/12/2018   Procedure: COLONOSCOPY;  Surgeon: Rogene Houston, MD;  [facility];  Service: Endoscopy;  Laterality: N/A;  200  .  ESOPHAGOGASTRODUODENOSCOPY N/A 04/24/2019   Procedure: ESOPHAGOGASTRODUODENOSCOPY (EGD);  Surgeon: Rogene Houston, MD;  [facility];  Service: Endoscopy;  Laterality: N/A;  125  . IR IMAGING GUIDED PORT INSERTION  10/24/2017  . OMENTECTOMY N/A 10/02/2017   Procedure: OMENTECTOMY;  Surgeon: Everitt Amber, MD;  [facility];  Service: Gynecology;  Laterality: N/A;  . OTHER SURGICAL HISTORY  07/2017   Left ovary removed  . POLYPECTOMY  04/24/2019   Procedure: POLYPECTOMY;  Surgeon: Rogene Houston, MD;  [facility];  Service: Endoscopy;;  gastric  . ROBOTIC ASSISTED TOTAL HYSTERECTOMY WITH BILATERAL SALPINGO OOPHERECTOMY Right 10/02/2017   Procedure: XI ROBOTIC ASSISTED TOTAL HYSTERECTOMY WITH RIGHT SALPINGO OOPHORECTOMY;  Surgeon: Everitt Amber, MD;  [facility];  Service: Gynecology;  Laterality: Right;  . ROBOTIC PELVIC AND PARA-AORTIC LYMPH NODE DISSECTION Bilateral 10/02/2017   Procedure: XI ROBOTIC PELVIC LYMPH NODE DISSECTION;  Surgeon: Everitt Amber, MD;  [facility];  Service: Gynecology;  Laterality: Bilateral;     Family History  Problem Relation Age of Onset  . Hypertension Mother   . Prostate cancer Father 62       d. 84  . Diabetes Paternal Aunt   . Heart attack Paternal Aunt   . Heart attack Paternal Grandmother 95  . Thyroid disease Other 14  Social History   Social History Narrative   Married 26 years.Lives with husband and daughter.Homemaker.   Social History   Tobacco Use  . Smoking status: Never Smoker  . Smokeless tobacco: Never Used  Substance Use Topics  . Alcohol use: Never    Current Meds  Medication Sig  . amitriptyline (ELAVIL) 10 MG tablet Take 2 tablets (20 mg total) by mouth at bedtime. (Patient taking differently: Take 10 mg by mouth at bedtime.)  . Cholecalciferol (VITAMIN D-3) 125 MCG (5000 UT) TABS Take 10,000 Units by mouth daily.   Marland Kitchen dicyclomine (BENTYL) 10 MG capsule TAKE 1 CAPSULE BY MOUTH THREE TIMES DAILY  BEFORE MEAL(S)  . estradiol (ESTRACE) 0.1 MG/GM vaginal cream Place 1 Applicatorful vaginally 3 (three) times a week.  . estradiol (ESTRACE) 1 MG tablet Take 1 tablet (1 mg total) by mouth daily.  . hydrocortisone (PROCTOZONE-HC) 2.5 % rectal cream Place 1 application rectally 2 (two) times daily as needed for hemorrhoids or anal itching.  . pantoprazole (PROTONIX) 40 MG tablet Take 1 tablet (40 mg total) by mouth daily.  . [DISCONTINUED] estradiol (ESTRACE) 0.5 MG tablet Take 2 tablets (1 mg total) by mouth daily.  . [DISCONTINUED] lisinopril (ZESTRIL) 5 MG tablet Take 1 tablet by mouth once daily  . [DISCONTINUED] progesterone (PROMETRIUM) 200 MG capsule Take 2 capsules (400 mg total) by mouth daily.       Objective:   Today's Vitals: BP 130/71 (BP Location: Left Arm, Patient Position: Sitting, Cuff Size: Normal)   Pulse 71   Temp 97.8 F (36.6 C) (Temporal)   Resp 18   Ht 5\' 5"  (1.651 m)   Wt 178 lb (80.7 kg)   LMP 09/24/2017   SpO2 97%   BMI 29.62 kg/m  Vitals with BMI 04/22/2020 02/13/2020 12/25/2019  Height 5\' 5"  5\' 5"  5\' 5"   Weight 178 lbs 175 lbs 10 oz 175 lbs 2 oz  BMI 29.62 78.24 23.53  Systolic 614 431 540  Diastolic 71 68 84  Pulse 71 67 68     Physical Exam  She looks systemically well.  Blood pressure is in a good range.  Examination of the neck shows the possibility of thyroid megaly/goiter.  There is no neck lymphadenopathy.      Assessment   1. Hot flashes due to surgical menopause   2. Essential hypertension, benign   3. Vitamin D deficiency disease   4. Thyromegaly       Tests ordered Orders Placed This Encounter  Procedures  . US THYROID  . CBC  . COMPLETE METABOLIC PANEL WITH GFR  . Estradiol  . Progesterone  . T3, free  . T4, free  . TSH     Plan: 1. She will continue with estradiol and progesterone at current doses and we will check levels to see if we need to adjust any of the doses. 2. We will check thyroid function and also  obtain an ultrasound of the thyroid for further evaluation of the possible goiter. 3. Further recommendations will depend on all these results and I will see her in 3 months time for follow-up.   Meds ordered this encounter  Medications  . progesterone (PROMETRIUM) 200 MG capsule    Sig: Take 2 capsules (400 mg total) by mouth daily.    Dispense:  180 capsule    Refill:  1  . estradiol (ESTRACE) 1 MG tablet    Sig: Take 1 tablet (1 mg total) by mouth daily.  Dispense:  90 tablet    Refill:  1  . lisinopril (ZESTRIL) 5 MG tablet    Sig: Take 1 tablet (5 mg total) by mouth daily.    Dispense:  90 tablet    Refill:  1    Sabella Traore Luther Parody, MD

## 2020-04-23 LAB — CBC
HCT: 40.8 % (ref 35.0–45.0)
Hemoglobin: 13.6 g/dL (ref 11.7–15.5)
MCH: 28.2 pg (ref 27.0–33.0)
MCHC: 33.3 g/dL (ref 32.0–36.0)
MCV: 84.6 fL (ref 80.0–100.0)
MPV: 10.9 fL (ref 7.5–12.5)
Platelets: 367 10*3/uL (ref 140–400)
RBC: 4.82 10*6/uL (ref 3.80–5.10)
RDW: 12.8 % (ref 11.0–15.0)
WBC: 9.7 10*3/uL (ref 3.8–10.8)

## 2020-04-23 LAB — COMPLETE METABOLIC PANEL WITH GFR
AG Ratio: 1.6 (calc) (ref 1.0–2.5)
ALT: 16 U/L (ref 6–29)
AST: 16 U/L (ref 10–35)
Albumin: 4.4 g/dL (ref 3.6–5.1)
Alkaline phosphatase (APISO): 84 U/L (ref 37–153)
BUN: 15 mg/dL (ref 7–25)
CO2: 28 mmol/L (ref 20–32)
Calcium: 10.1 mg/dL (ref 8.6–10.4)
Chloride: 108 mmol/L (ref 98–110)
Creat: 0.83 mg/dL (ref 0.50–1.05)
GFR, Est African American: 95 mL/min/{1.73_m2} (ref >=60)
GFR, Est Non African American: 82 mL/min/{1.73_m2} (ref >=60)
Globulin: 2.7 g/dL (calc) (ref 1.9–3.7)
Glucose, Bld: 84 mg/dL (ref 65–139)
Potassium: 4.2 mmol/L (ref 3.5–5.3)
Sodium: 143 mmol/L (ref 135–146)
Total Bilirubin: 0.3 mg/dL (ref 0.2–1.2)
Total Protein: 7.1 g/dL (ref 6.1–8.1)

## 2020-04-23 LAB — TSH: TSH: 1.48 mIU/L

## 2020-04-23 LAB — T4, FREE: Free T4: 1.2 ng/dL (ref 0.8–1.8)

## 2020-04-23 LAB — T3, FREE: T3, Free: 3.3 pg/mL (ref 2.3–4.2)

## 2020-04-23 LAB — ESTRADIOL: Estradiol: 33 pg/mL

## 2020-04-23 LAB — PROGESTERONE: Progesterone: 73.3 ng/mL

## 2020-05-04 ENCOUNTER — Other Ambulatory Visit: Payer: Self-pay

## 2020-05-04 ENCOUNTER — Inpatient Hospital Stay: Payer: 59 | Attending: Gynecologic Oncology

## 2020-05-04 DIAGNOSIS — Z452 Encounter for adjustment and management of vascular access device: Secondary | ICD-10-CM | POA: Insufficient documentation

## 2020-05-04 DIAGNOSIS — C541 Malignant neoplasm of endometrium: Secondary | ICD-10-CM | POA: Diagnosis present

## 2020-05-04 DIAGNOSIS — C562 Malignant neoplasm of left ovary: Secondary | ICD-10-CM | POA: Insufficient documentation

## 2020-05-04 MED ORDER — SODIUM CHLORIDE 0.9% FLUSH
10.0000 mL | Freq: Once | INTRAVENOUS | Status: AC
  Administered 2020-05-04: 10 mL
  Filled 2020-05-04: qty 10

## 2020-05-04 MED ORDER — HEPARIN SOD (PORK) LOCK FLUSH 100 UNIT/ML IV SOLN
500.0000 [IU] | Freq: Once | INTRAVENOUS | Status: AC
  Administered 2020-05-04: 500 [IU]
  Filled 2020-05-04: qty 5

## 2020-05-24 ENCOUNTER — Telehealth (INDEPENDENT_AMBULATORY_CARE_PROVIDER_SITE_OTHER): Payer: Self-pay

## 2020-05-26 ENCOUNTER — Other Ambulatory Visit (INDEPENDENT_AMBULATORY_CARE_PROVIDER_SITE_OTHER): Payer: Self-pay | Admitting: Internal Medicine

## 2020-05-26 DIAGNOSIS — E010 Iodine-deficiency related diffuse (endemic) goiter: Secondary | ICD-10-CM

## 2020-05-26 NOTE — Telephone Encounter (Signed)
I see that I put the order in in February,  do you need another order?

## 2020-05-26 NOTE — Telephone Encounter (Signed)
What do I need to authorize?

## 2020-05-26 NOTE — Telephone Encounter (Signed)
Okay, I have just put the order in.

## 2020-05-26 NOTE — Telephone Encounter (Signed)
US Thyroid, to reschedule.

## 2020-05-26 NOTE — Telephone Encounter (Signed)
Still waiting for status of Prior auth.  Appt is coming up soon.

## 2020-05-26 NOTE — Telephone Encounter (Signed)
Thank you :)

## 2020-05-26 NOTE — Telephone Encounter (Signed)
Yes please

## 2020-06-17 ENCOUNTER — Other Ambulatory Visit: Payer: Self-pay

## 2020-06-17 ENCOUNTER — Ambulatory Visit (HOSPITAL_COMMUNITY)
Admission: RE | Admit: 2020-06-17 | Discharge: 2020-06-17 | Disposition: A | Discharge status: Home / Self Care | Payer: 59 | Source: Ambulatory Visit | Attending: Internal Medicine | Admitting: Internal Medicine

## 2020-06-17 DIAGNOSIS — E010 Iodine-deficiency related diffuse (endemic) goiter: Secondary | ICD-10-CM | POA: Insufficient documentation

## 2020-06-21 ENCOUNTER — Inpatient Hospital Stay: Payer: 59 | Attending: Gynecologic Oncology

## 2020-06-21 ENCOUNTER — Other Ambulatory Visit: Payer: Self-pay

## 2020-06-21 DIAGNOSIS — C541 Malignant neoplasm of endometrium: Secondary | ICD-10-CM | POA: Insufficient documentation

## 2020-06-21 DIAGNOSIS — C562 Malignant neoplasm of left ovary: Secondary | ICD-10-CM | POA: Diagnosis present

## 2020-06-21 DIAGNOSIS — Z452 Encounter for adjustment and management of vascular access device: Secondary | ICD-10-CM | POA: Diagnosis present

## 2020-06-21 MED ORDER — SODIUM CHLORIDE 0.9% FLUSH
10.0000 mL | Freq: Once | INTRAVENOUS | Status: AC
  Administered 2020-06-21: 10 mL
  Filled 2020-06-21: qty 10

## 2020-06-21 MED ORDER — HEPARIN SOD (PORK) LOCK FLUSH 100 UNIT/ML IV SOLN
500.0000 [IU] | Freq: Once | INTRAVENOUS | Status: AC
Start: 2020-06-21 — End: 2020-06-21
  Administered 2020-06-21: 500 [IU]
  Filled 2020-06-21: qty 5

## 2020-06-21 NOTE — Patient Instructions (Signed)
Implanted Port Insertion, Care After This sheet gives you information about how to care for yourself after your procedure. Your health care provider may also give you more specific instructions. If you have problems or questions, contact your health care provider. What can I expect after the procedure? After the procedure, it is common to have:  Discomfort at the port insertion site.  Bruising on the skin over the port. This should improve over 3-4 days. Follow these instructions at home: Port care  After your port is placed, you will get a manufacturer's information card. The card has information about your port. Keep this card with you at all times.  Take care of the port as told by your health care provider. Ask your health care provider if you or a family member can get training for taking care of the port at home. A home health care nurse may also take care of the port.  Make sure to remember what type of port you have. Incision care  Follow instructions from your health care provider about how to take care of your port insertion site. Make sure you: ? Wash your hands with soap and water before and after you change your bandage (dressing). If soap and water are not available, use hand sanitizer. ? Change your dressing as told by your health care provider. ? Leave stitches (sutures), skin glue, or adhesive strips in place. These skin closures may need to stay in place for 2 weeks or longer. If adhesive strip edges start to loosen and curl up, you may trim the loose edges. Do not remove adhesive strips completely unless your health care provider tells you to do that.  Check your port insertion site every day for signs of infection. Check for: ? Redness, swelling, or pain. ? Fluid or blood. ? Warmth. ? Pus or a bad smell.      Activity  Return to your normal activities as told by your health care provider. Ask your health care provider what activities are safe for you.  Do not  lift anything that is heavier than 10 lb (4.5 kg), or the limit that you are told, until your health care provider says that it is safe. General instructions  Take over-the-counter and prescription medicines only as told by your health care provider.  Do not take baths, swim, or use a hot tub until your health care provider approves. Ask your health care provider if you may take showers. You may only be allowed to take sponge baths.  Do not drive for 24 hours if you were given a sedative during your procedure.  Wear a medical alert bracelet in case of an emergency. This will tell any health care providers that you have a port.  Keep all follow-up visits as told by your health care provider. This is important. Contact a health care provider if:  You cannot flush your port with saline as directed, or you cannot draw blood from the port.  You have a fever or chills.  You have redness, swelling, or pain around your port insertion site.  You have fluid or blood coming from your port insertion site.  Your port insertion site feels warm to the touch.  You have pus or a bad smell coming from the port insertion site. Get help right away if:  You have chest pain or shortness of breath.  You have bleeding from your port that you cannot control. Summary  Take care of the port as told by your   health care provider. Keep the manufacturer's information card with you at all times.  Change your dressing as told by your health care provider.  Contact a health care provider if you have a fever or chills or if you have redness, swelling, or pain around your port insertion site.  Keep all follow-up visits as told by your health care provider. This information is not intended to replace advice given to you by your health care provider. Make sure you discuss any questions you have with your health care provider. Document Revised: 09/11/2017 Document Reviewed: 09/11/2017 Elsevier Patient Education   2021 [employer]  

## 2020-06-22 ENCOUNTER — Other Ambulatory Visit: Payer: Self-pay

## 2020-06-22 ENCOUNTER — Emergency Department (HOSPITAL_COMMUNITY): Payer: 59

## 2020-06-22 ENCOUNTER — Encounter (HOSPITAL_COMMUNITY): Payer: Self-pay | Admitting: Emergency Medicine

## 2020-06-22 ENCOUNTER — Telehealth (INDEPENDENT_AMBULATORY_CARE_PROVIDER_SITE_OTHER): Payer: Self-pay

## 2020-06-22 ENCOUNTER — Emergency Department (HOSPITAL_COMMUNITY)
Admission: EM | Admit: 2020-06-22 | Discharge: 2020-06-22 | Disposition: A | Discharge status: Home / Self Care | Payer: 59 | Attending: Emergency Medicine | Admitting: Emergency Medicine

## 2020-06-22 DIAGNOSIS — Z79899 Other long term (current) drug therapy: Secondary | ICD-10-CM | POA: Diagnosis not present

## 2020-06-22 DIAGNOSIS — I10 Essential (primary) hypertension: Secondary | ICD-10-CM | POA: Diagnosis not present

## 2020-06-22 DIAGNOSIS — Z8543 Personal history of malignant neoplasm of ovary: Secondary | ICD-10-CM | POA: Insufficient documentation

## 2020-06-22 DIAGNOSIS — K219 Gastro-esophageal reflux disease without esophagitis: Secondary | ICD-10-CM | POA: Diagnosis not present

## 2020-06-22 DIAGNOSIS — R1031 Right lower quadrant pain: Secondary | ICD-10-CM | POA: Diagnosis present

## 2020-06-22 DIAGNOSIS — K269 Duodenal ulcer, unspecified as acute or chronic, without hemorrhage or perforation: Secondary | ICD-10-CM | POA: Insufficient documentation

## 2020-06-22 LAB — COMPREHENSIVE METABOLIC PANEL
ALT: 20 U/L (ref 0–44)
AST: 23 U/L (ref 15–41)
Albumin: 4.3 g/dL (ref 3.5–5.0)
Alkaline Phosphatase: 73 U/L (ref 38–126)
Anion gap: 8 (ref 5–15)
BUN: 11 mg/dL (ref 6–20)
CO2: 25 mmol/L (ref 22–32)
Calcium: 9.8 mg/dL (ref 8.9–10.3)
Chloride: 106 mmol/L (ref 98–111)
Creatinine, Ser: 0.66 mg/dL (ref 0.44–1.00)
GFR, Estimated: >60 mL/min (ref >60)
Glucose, Bld: 91 mg/dL (ref 70–99)
Potassium: 4.1 mmol/L (ref 3.5–5.1)
Sodium: 139 mmol/L (ref 135–145)
Total Bilirubin: 0.7 mg/dL (ref 0.3–1.2)
Total Protein: 7.6 g/dL (ref 6.5–8.1)

## 2020-06-22 LAB — URINALYSIS, ROUTINE W REFLEX MICROSCOPIC
Bilirubin Urine: NEGATIVE
Glucose, UA: NEGATIVE mg/dL
Ketones, ur: NEGATIVE mg/dL
Leukocytes,Ua: NEGATIVE
Nitrite: NEGATIVE
Protein, ur: NEGATIVE mg/dL
Specific Gravity, Urine: 1.036 — ABNORMAL HIGH (ref 1.005–1.030)
pH: 7 (ref 5.0–8.0)

## 2020-06-22 LAB — CBC
HCT: 43.4 % (ref 36.0–46.0)
Hemoglobin: 13.9 g/dL (ref 12.0–15.0)
MCH: 28 pg (ref 26.0–34.0)
MCHC: 32 g/dL (ref 30.0–36.0)
MCV: 87.5 fL (ref 80.0–100.0)
Platelets: 312 10*3/uL (ref 150–400)
RBC: 4.96 MIL/uL (ref 3.87–5.11)
RDW: 13.6 % (ref 11.5–15.5)
WBC: 8.4 10*3/uL (ref 4.0–10.5)
nRBC: 0 % (ref 0.0–0.2)

## 2020-06-22 LAB — LIPASE, BLOOD: Lipase: 21 U/L (ref 11–51)

## 2020-06-22 MED ORDER — SUCRALFATE 1 G PO TABS: 1.0000 g | ORAL_TABLET | Freq: Three times a day (TID) | ORAL | 1 refills | Status: DC

## 2020-06-22 MED ORDER — IOHEXOL 300 MG/ML  SOLN
100.0000 mL | Freq: Once | INTRAMUSCULAR | Status: AC | PRN
  Administered 2020-06-22: 100 mL via INTRAVENOUS

## 2020-06-22 MED ORDER — FAMOTIDINE 20 MG PO TABS: 20.0000 mg | ORAL_TABLET | Freq: Two times a day (BID) | ORAL | 0 refills | Status: DC

## 2020-06-22 MED ORDER — ONDANSETRON 4 MG PO TBDP: 4.0000 mg | ORAL_TABLET | Freq: Three times a day (TID) | ORAL | 0 refills | Status: DC | PRN

## 2020-06-22 NOTE — ED Provider Notes (Signed)
MSE was initiated and I personally evaluated the patient and placed orders (if any) at  9:51 AM on June 22, 2020.  BP (!) 146/86   Pulse 68   Temp 98.6 F (37 C)   Resp 18   Ht 5\' 3"  (1.6 m)   Wt 84.8 kg   LMP 09/24/2017   SpO2 98%   BMI 33.13 kg/m   Patient here for 2 days of right lower quadrant abdominal pain.  Initially thought it was because she could not move her bowels.  Took some stool softeners with minimal output.  Right lower quadrant pain is worse with movement, endorses nausea without vomiting.  Decreased appetite.  Does have her appendix.  No Pelvic complaints or urinary symptoms.  Alert, no acute distress    Medically screening exam initiated at 9:51 AM. Appropriate orders placed.  Oakleigh Hesketh was informed that the remainder of the evaluation will be completed by another provider, this initial triage assessment does not replace that evaluation, and the importance of remaining in the ED until their evaluation is complete.       Roselani Grajeda, [place] N, PA-C 06/22/20 7026    Isla Pence, MD 06/22/20 1240

## 2020-06-22 NOTE — Discharge Instructions (Addendum)
Please read instructions below. Drink clear liquids until your stomach feels better. Then, slowly introduce bland foods into your diet as tolerated, such as bread, rice, apples, bananas. You can take zofran every 8 hours as needed for nausea. Follow closely with your gastroenterologist.  It is important that you monitor your symptoms and return to the ER for worsening abdominal pain, fever, uncontrollable vomiting, or new or concerning symptoms.

## 2020-06-22 NOTE — Telephone Encounter (Signed)
Patient called this morning and stated that she is having severe right sided abdominal pain that started yesterday. Patient stated that she is having severe nausea with the pain. Patient stated that she is not having normal bowel movements and she has had a BM and only very little comes out and stated that it does not look right. Patient stated that she is very uncomfortable and not feeling well. Patient wanted to know if she needed to go to the ER. I advised for patient to go to ER for a full evaluation. Patient verbalized an understanding and stated that she will go to [facility].  Sending as Juluis Rainier.

## 2020-06-22 NOTE — ED Triage Notes (Signed)
Pt reports abdominal pain RLQ. Pt reports took two laxatives at home with minimal output.

## 2020-06-22 NOTE — ED Provider Notes (Signed)
[name] EMERGENCY DEPARTMENT Provider Note   CSN: [id number] Arrival date & time: 06/22/20  0930     History Chief Complaint  Patient presents with  . Abdominal Pain    Kristy Franklin is a 50 y.o. female past medical history of GERD, ovarian cancer status post total hysterectomy, hypertension, presenting for evaluation of right lower quadrant abdominal pain that began yesterday morning.  Patient states symptoms felt worse overnight when she was tossing and turning in bed, the movement caused worsening pain.  Pain continues to be worse with movement.  It is localized to the right lower quadrant with associated nausea, no vomiting.  Does endorse decreased appetite.  Felt as though she could not move her bowels and took some stool softeners with minimal improvement.  No fevers or chills.  No urinary symptoms.  Regarding history of ovarian cancer, she had total hysterectomy. Completed chemo in 2019.   The history is provided by the patient.       Past Medical History:  Diagnosis Date  . Abdominal pain    Right mid only with bowel movements  . Blood in urine   . Change in bowel movement    more green color  . Essential hypertension, benign 11/12/2018  . Family history of prostate cancer   . GERD (gastroesophageal reflux disease)   . Headache    after menstrual cycle every month  . History of gallstones   . Hot flashes due to surgical menopause 12/17/2018  . Hypertension   . Neck pain on right side    pulsating  . Numbness and tingling of right leg   . Obesity   . Obesity (BMI 30.0-34.9) 12/17/2018  . Ovarian cancer, left ([place])   . PPD positive   . Skin rash    Chest, forehead, left arm    Patient Active Problem List   Diagnosis Date Noted  . Headache 12/25/2019  . GERD (gastroesophageal reflux disease) 06/03/2019  . Abdominal pain, epigastric 06/03/2019  . Nausea without vomiting 06/03/2019  . Obesity (BMI 30.0-34.9) 12/17/2018  . Hot flashes due to surgical  menopause 12/17/2018  . Essential hypertension, benign 11/12/2018  . Rectal pain 08/13/2018  . Dysuria 12/10/2017  . Steroid-induced hyperglycemia 11/20/2017  . 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 Arthralgia 11/20/2017  . Chemotherapy-induced nausea 11/20/2017  . Genetic testing 10/26/2017  . Elevated liver enzymes 10/25/2017  . Family history of prostate cancer   . Obesity 10/02/2017  . Urinary urgency 10/02/2017  . Ovarian cancer ([place]) 10/02/2017  . Ovarian cancer on left [facility]) 09/18/2017    Past Surgical History:  Procedure Laterality Date  . BIOPSY  04/24/2019   Procedure: BIOPSY;  Surgeon: Rogene Houston, MD;  [facility];  Service: Endoscopy;;  antrum  . CESAREAN SECTION     x2  . CHOLECYSTECTOMY     20 years ago  . COLONOSCOPY N/A 09/12/2018   Procedure: COLONOSCOPY;  Surgeon: Rogene Houston, MD;  [facility];  Service: Endoscopy;  Laterality: N/A;  200  . ESOPHAGOGASTRODUODENOSCOPY N/A 04/24/2019   Procedure: ESOPHAGOGASTRODUODENOSCOPY (EGD);  Surgeon: Rogene Houston, MD;  Location: [facility];  Service: Endoscopy;  Laterality: N/A;  125  . IR IMAGING GUIDED PORT INSERTION  10/24/2017  . OMENTECTOMY N/A 10/02/2017   Procedure: OMENTECTOMY;  Surgeon: Everitt Amber, MD;  Location: [facility] OR[facility];  Service: Gynecology;  Laterality: N/A;  . OTHER SURGICAL HISTORY  07/2017   Left ovary removed  .  POLYPECTOMY  04/24/2019   Procedure: POLYPECTOMY;  Surgeon: Rogene Houston, MD;  [facility];  Service: Endoscopy;;  gastric  . ROBOTIC ASSISTED TOTAL HYSTERECTOMY WITH BILATERAL SALPINGO OOPHERECTOMY Right 10/02/2017   Procedure: XI ROBOTIC ASSISTED TOTAL HYSTERECTOMY WITH RIGHT SALPINGO OOPHORECTOMY;  Surgeon: Everitt Amber, MD;  [facility];  Service: Gynecology;  Laterality: Right;  . ROBOTIC PELVIC AND PARA-AORTIC LYMPH NODE DISSECTION Bilateral 10/02/2017   Procedure: XI ROBOTIC PELVIC LYMPH NODE DISSECTION;  Surgeon:  Everitt Amber, MD;  [facility];  Service: Gynecology;  Laterality: Bilateral;     OB History   No obstetric history on file.     Family History  Problem Relation Age of Onset  . Hypertension Mother   . Prostate cancer Father 55       d. 54  . Diabetes Paternal Aunt   . Heart attack Paternal Aunt   . Heart attack Paternal Grandmother 95  . Thyroid disease Other 14    Social History   Tobacco Use  . Smoking status: Never Smoker  . Smokeless tobacco: Never Used  Vaping Use  . Vaping Use: Never used  Substance Use Topics  . Alcohol use: Never  . Drug use: Never    Home Medications Prior to Admission medications   Medication Sig Start Date End Date Taking? Authorizing Provider  amitriptyline (ELAVIL) 10 MG tablet Take 2 tablets (20 mg total) by mouth at bedtime. Patient taking differently: Take 10 mg by mouth at bedtime. 03/08/20  Yes Harvel Quale, MD  Cholecalciferol (VITAMIN D-3) 125 MCG (5000 UT) TABS Take 10,000 Units by mouth daily.    Yes [provider]  estradiol (ESTRACE) 1 MG tablet Take 1 tablet (1 mg total) by mouth daily. 04/22/20  Yes Gosrani, Nimish C, MD  famotidine (PEPCID) 20 MG tablet Take 1 tablet (20 mg total) by mouth 2 (two) times daily. 06/22/20  Yes Quentin Cornwall, [place] N, PA-C  lisinopril (ZESTRIL) 5 MG tablet Take 1 tablet (5 mg total) by mouth daily. 04/22/20  Yes Gosrani, Nimish C, MD  ondansetron (ZOFRAN ODT) 4 MG disintegrating tablet Take 1 tablet (4 mg total) by mouth every 8 (eight) hours as needed for nausea or vomiting. 06/22/20  Yes Abdiel Blackerby, [place] N, PA-C  pantoprazole (PROTONIX) 40 MG tablet Take 1 tablet (40 mg total) by mouth daily. 03/08/20  Yes Harvel Quale, MD  progesterone (PROMETRIUM) 200 MG capsule Take 2 capsules (400 mg total) by mouth daily. 04/22/20  Yes Gosrani, Nimish C, MD  sucralfate (CARAFATE) 1 g tablet Take 1 tablet (1 g total) by mouth 4 (four) times daily -  with meals and at bedtime for 7  days. 06/22/20 06/29/20 Yes Quentin Cornwall, [place] N, PA-C  dicyclomine (BENTYL) 10 MG capsule TAKE 1 CAPSULE BY MOUTH THREE TIMES DAILY BEFORE MEAL(S) Patient not taking: Reported on 06/22/2020 07/25/19   Rogene Houston, MD  estradiol (ESTRACE) 0.1 MG/GM vaginal cream Place 1 Applicatorful vaginally 3 (three) times a week. Patient not taking: Reported on 06/22/2020 08/27/19   Everitt Amber, MD  hydrocortisone (PROCTOZONE-HC) 2.5 % rectal cream Place 1 application rectally 2 (two) times daily as needed for hemorrhoids or anal itching. Patient not taking: Reported on 06/22/2020 01/01/20   Ezzard Standing, PA-C    Allergies    Other  Review of Systems   Review of Systems  Constitutional: Positive for appetite change.  Gastrointestinal: Positive for abdominal pain and nausea.  All other systems reviewed and are negative.  Physical Exam Updated Vital Signs BP 118/70   Pulse (!) 58   Temp 98.6 F (37 C)   Resp 16   Ht 5\' 3"  (1.6 m)   Wt 84.8 kg   LMP 09/24/2017   SpO2 95%   BMI 33.13 kg/m   Physical Exam Vitals and nursing note reviewed.  Constitutional:      General: She is not in acute distress.    Appearance: She is well-developed.  HENT:     Head: Normocephalic and atraumatic.  Eyes:     Conjunctiva/sclera: Conjunctivae normal.  Cardiovascular:     Rate and Rhythm: Normal rate and regular rhythm.  Pulmonary:     Effort: Pulmonary effort is normal. No respiratory distress.     Breath sounds: Normal breath sounds.  Abdominal:     General: Bowel sounds are normal.     Palpations: Abdomen is soft.     Tenderness: There is abdominal tenderness in the right upper quadrant, right lower quadrant, periumbilical area and suprapubic area. There is no guarding or rebound.  Skin:    General: Skin is warm.  Neurological:     Mental Status: She is alert.  Psychiatric:        Behavior: Behavior normal.     ED Results / Procedures / Treatments   Labs (all labs ordered are listed, but  only abnormal results are displayed) Labs Reviewed  URINALYSIS, ROUTINE W REFLEX MICROSCOPIC - Abnormal; Notable for the following components:      Result Value   APPearance HAZY (*)    Specific Gravity, Urine 1.036 (*)    Hgb urine dipstick SMALL (*)    Bacteria, UA RARE (*)    All other components within normal limits  COMPREHENSIVE METABOLIC PANEL  CBC  LIPASE, BLOOD    EKG None  Radiology CT Abdomen Pelvis W Contrast  Result Date: 06/22/2020 CLINICAL DATA:  Right lower quadrant abdominal pain. Appendicitis suspected. Nausea. EXAM: CT ABDOMEN AND PELVIS WITH CONTRAST TECHNIQUE: Multidetector CT imaging of the abdomen and pelvis was performed using the standard protocol following bolus administration of intravenous contrast. CONTRAST:  169mL OMNIPAQUE IOHEXOL 300 MG/ML  SOLN COMPARISON:  CT of the abdomen pelvis 08/21/2019 and 02/26/2019 FINDINGS: Lower chest: Lung bases demonstrate minimal atelectasis. Heart size is normal. Hepatobiliary: No focal liver abnormality is seen. Status post cholecystectomy. No biliary dilatation. Pancreas: Unremarkable. No pancreatic ductal dilatation or surrounding inflammatory changes. Spleen: Normal in size without focal abnormality. Adrenals/Urinary Tract: Adrenal glands are normal bilaterally. Kidneys and ureters are within normal limits. No stone or mass lesion is present. No obstruction is present. The urinary bladder is within normal limits. Stomach/Bowel: Stomach is unremarkable. Duodenal ulcer is present with a fluid level. No inflammatory changes are present. Distal duodenum is within normal limits. Small bowel is normal. Distal scratched at the terminal ileum is within normal limits. The appendix is visualized and normal. The ascending and transverse colon are within normal limits. Diverticular changes are present in the descending and sigmoid colon without significant inflammation. Vascular/Lymphatic: No significant vascular findings are present. No  enlarged abdominal or pelvic lymph nodes. Reproductive: Prostate is unremarkable. Other: No abdominal wall hernia or abnormality. No abdominopelvic ascites. Musculoskeletal: No acute or significant osseous findings. IMPRESSION: 1. No acute or focal lesion to explain the patient's symptoms. Normal appendix. 2. Duodenal ulcer without inflammatory changes. 3. Cholecystectomy. 4. Descending and sigmoid diverticulosis without diverticulitis. Electronically Signed   By: San Morelle M.D.   On: 06/22/2020 13:45  Procedures Procedures   Medications Ordered in ED Medications  iohexol (OMNIPAQUE) 300 MG/ML solution 100 mL (100 mLs Intravenous Contrast Given 06/22/20 1304)    ED Course  I have reviewed the triage vital signs and the nursing notes.  Pertinent labs & imaging results that were available during my care of the patient were reviewed by me and considered in my medical decision making (see chart for details).    MDM Rules/Calculators/A&P                          Patient presenting for right lower quadrant abdominal pain that began yesterday.  It is worse with movement and palpation.  No associated urinary symptoms.  Initial presentation was concerning for acute appendicitis versus bowel obstruction.  Blood work ordered, patient did not require any medication management of symptoms though this was offered.  Her blood work is overall reassuring, no leukocytosis or anemia, no electrolyte derangement, normal renal and hepatic function.  Lipase within normal limits.  UA without signs of infection.  CT scan shows evidence of duodenal ulcer without perforation, no other acute findings in the abdomen or pelvis including normal appendix, no obstruction, normal-appearing lower quadrant.  Discussed findings with patient, recommendations for symptom management.  She takes omeprazole at home, recommend additional symptom relief, clear liquids, follow-up with her GI specialist closely.  Discussed  importance of symptom monitoring, as there is unclear cause of her right lower quadrant pain today.  Recommend strict return precautions.  Patient agreeable to plan, discharged in no distress.  Discussed results, findings, treatment and follow up. Patient advised of return precautions. Patient verbalized understanding and agreed with plan.  Final Clinical Impression(s) / ED Diagnoses Final diagnoses:  Duodenal ulcer    Rx / DC Orders ED Discharge Orders         Ordered    sucralfate (CARAFATE) 1 g tablet  3 times daily with meals & bedtime        06/22/20 1516    famotidine (PEPCID) 20 MG tablet  2 times daily        06/22/20 1516    ondansetron (ZOFRAN ODT) 4 MG disintegrating tablet  Every 8 hours PRN        06/22/20 1516           Vondell Babers, [place] N, PA-C 06/22/20 1523    Isla Pence, MD 06/24/20 1520

## 2020-06-23 ENCOUNTER — Encounter (INDEPENDENT_AMBULATORY_CARE_PROVIDER_SITE_OTHER): Payer: Self-pay | Admitting: Internal Medicine

## 2020-07-05 ENCOUNTER — Other Ambulatory Visit (INDEPENDENT_AMBULATORY_CARE_PROVIDER_SITE_OTHER): Payer: Self-pay | Admitting: Internal Medicine

## 2020-07-07 ENCOUNTER — Encounter (INDEPENDENT_AMBULATORY_CARE_PROVIDER_SITE_OTHER): Payer: Self-pay

## 2020-07-07 ENCOUNTER — Ambulatory Visit (INDEPENDENT_AMBULATORY_CARE_PROVIDER_SITE_OTHER): Payer: 59 | Admitting: Gastroenterology

## 2020-07-07 ENCOUNTER — Encounter (INDEPENDENT_AMBULATORY_CARE_PROVIDER_SITE_OTHER): Payer: Self-pay | Admitting: Gastroenterology

## 2020-07-07 ENCOUNTER — Other Ambulatory Visit (INDEPENDENT_AMBULATORY_CARE_PROVIDER_SITE_OTHER): Payer: Self-pay

## 2020-07-07 ENCOUNTER — Other Ambulatory Visit: Payer: Self-pay

## 2020-07-07 ENCOUNTER — Ambulatory Visit (INDEPENDENT_AMBULATORY_CARE_PROVIDER_SITE_OTHER): Payer: 59 | Admitting: Internal Medicine

## 2020-07-07 ENCOUNTER — Encounter (INDEPENDENT_AMBULATORY_CARE_PROVIDER_SITE_OTHER): Payer: Self-pay | Admitting: Internal Medicine

## 2020-07-07 VITALS — BP 130/71 | HR 77 | Temp 97.7°F | Resp 18 | Ht 63.0 in | Wt 172.0 lb

## 2020-07-07 VITALS — BP 119/55 | HR 73 | Temp 98.6°F | Ht 63.0 in | Wt 171.0 lb

## 2020-07-07 DIAGNOSIS — R933 Abnormal findings on diagnostic imaging of other parts of digestive tract: Secondary | ICD-10-CM | POA: Diagnosis not present

## 2020-07-07 DIAGNOSIS — R1033 Periumbilical pain: Secondary | ICD-10-CM | POA: Diagnosis not present

## 2020-07-07 DIAGNOSIS — N3000 Acute cystitis without hematuria: Secondary | ICD-10-CM | POA: Diagnosis not present

## 2020-07-07 MED ORDER — CIPROFLOXACIN HCL 500 MG PO TABS: 500.0000 mg | ORAL_TABLET | Freq: Two times a day (BID) | ORAL | 0 refills | Status: DC

## 2020-07-07 NOTE — Patient Instructions (Addendum)
Schedule EGD Finish Carafate prescription If you run out of famotidine, increas epantoprazole to 40 mg twice a day Instruction provided in the use of antireflux medication - patient should take medication in the morning 30-45 minutes before eating breakfast. Discussed avoidance of eating within 2 hours of lying down to sleep and benefit of blocks to elevate head of bed.

## 2020-07-07 NOTE — Progress Notes (Signed)
Metrics: Intervention Frequency ACO  Documented Smoking Status Yearly  Screened one or more times in 24 months  Cessation Counseling or  Active cessation medication Past 24 months  Past 24 months   Guideline developer: UpToDate (See UpToDate for funding source) Date Released: 2014       Wellness Office Visit  Subjective:  Patient ID: Kristy Franklin, female    DOB: 01/06/1971  Age: 50 y.o. MRN: [id number]  CC: Possible UTI HPI  This lady was in the emergency room about a couple of weeks ago with right lower quadrant abdominal pain.  CT abdomen showed duodenal ulcer which would not be in keeping with the site of her pain. She now describes dysuria.  She denies any hematuria, there is some nausea but no vomiting. Past Medical History:  Diagnosis Date  . Abdominal pain    Right mid only with bowel movements  . Blood in urine   . Change in bowel movement    more green color  . Essential hypertension, benign 11/12/2018  . Family history of prostate cancer   . GERD (gastroesophageal reflux disease)   . Headache    after menstrual cycle every month  . History of gallstones   . Hot flashes due to surgical menopause 12/17/2018  . Hypertension   . Neck pain on right side    pulsating  . Numbness and tingling of right leg   . Obesity   . Obesity (BMI 30.0-34.9) 12/17/2018  . Ovarian cancer, left ([place])   . PPD positive   . Skin rash    Chest, forehead, left arm   Past Surgical History:  Procedure Laterality Date  . BIOPSY  04/24/2019   Procedure: BIOPSY;  Surgeon: Rogene Houston, MD;  [facility];  Service: Endoscopy;;  antrum  . CESAREAN SECTION     x2  . CHOLECYSTECTOMY     20 years ago  . COLONOSCOPY N/A 09/12/2018   Procedure: COLONOSCOPY;  Surgeon: Rogene Houston, MD;  [facility];  Service: Endoscopy;  Laterality: N/A;  200  . ESOPHAGOGASTRODUODENOSCOPY N/A 04/24/2019   Procedure: ESOPHAGOGASTRODUODENOSCOPY (EGD);  Surgeon: Rogene Houston,  MD;  [facility];  Service: Endoscopy;  Laterality: N/A;  125  . IR IMAGING GUIDED PORT INSERTION  10/24/2017  . OMENTECTOMY N/A 10/02/2017   Procedure: OMENTECTOMY;  Surgeon: Everitt Amber, MD;  [facility];  Service: Gynecology;  Laterality: N/A;  . OTHER SURGICAL HISTORY  07/2017   Left ovary removed  . POLYPECTOMY  04/24/2019   Procedure: POLYPECTOMY;  Surgeon: Rogene Houston, MD;  [facility];  Service: Endoscopy;;  gastric  . ROBOTIC ASSISTED TOTAL HYSTERECTOMY WITH BILATERAL SALPINGO OOPHERECTOMY Right 10/02/2017   Procedure: XI ROBOTIC ASSISTED TOTAL HYSTERECTOMY WITH RIGHT SALPINGO OOPHORECTOMY;  Surgeon: Everitt Amber, MD;  [facility];  Service: Gynecology;  Laterality: Right;  . ROBOTIC PELVIC AND PARA-AORTIC LYMPH NODE DISSECTION Bilateral 10/02/2017   Procedure: XI ROBOTIC PELVIC LYMPH NODE DISSECTION;  Surgeon: Everitt Amber, MD;  [facility];  Service: Gynecology;  Laterality: Bilateral;     Family History  Problem Relation Age of Onset  . Hypertension Mother   . Prostate cancer Father 70       d. 61  . Diabetes Paternal Aunt   . Heart attack Paternal Aunt   . Heart attack Paternal Grandmother 95  . Thyroid disease Other 69    Social History   Social History Narrative   Married 26  years.Lives with husband and daughter.Homemaker.   Social History   Tobacco Use  . Smoking status: Never Smoker  . Smokeless tobacco: Never Used  Substance Use Topics  . Alcohol use: Never    Current Meds  Medication Sig  . Cholecalciferol (VITAMIN D-3) 125 MCG (5000 UT) TABS Take 10,000 Units by mouth daily.   . ciprofloxacin (CIPRO) 500 MG tablet Take 1 tablet (500 mg total) by mouth 2 (two) times daily.  Marland Kitchen estradiol (ESTRACE) 0.1 MG/GM vaginal cream Place 1 Applicatorful vaginally 3 (three) times a week.  . estradiol (ESTRACE) 1 MG tablet Take 1 tablet (1 mg total) by mouth daily.  . famotidine (PEPCID) 20 MG tablet Take 1 tablet (20 mg total) by  mouth 2 (two) times daily.  . ondansetron (ZOFRAN ODT) 4 MG disintegrating tablet Take 1 tablet (4 mg total) by mouth every 8 (eight) hours as needed for nausea or vomiting.  . pantoprazole (PROTONIX) 40 MG tablet Take 1 tablet (40 mg total) by mouth daily.       Objective:   Today's Vitals: BP 130/71 (BP Location: Right Arm, Patient Position: Sitting, Cuff Size: Normal)   Pulse 77   Temp 97.7 F (36.5 C)   Resp 18   Ht 5\' 3"  (1.6 m)   Wt 172 lb (78 kg)   LMP 09/24/2017   SpO2 97%   BMI 30.47 kg/m  Vitals with BMI 07/07/2020 06/22/2020 06/22/2020  Height 5\' 3"  - -  Weight 172 lbs - -  BMI 99.83 - -  Systolic 382 505 397  Diastolic 71 75 54  Pulse 77 76 59     Physical Exam  She looks systemically well.  She is afebrile.     Assessment   1. Acute cystitis without hematuria       Tests ordered Orders Placed This Encounter  Procedures  . Urinalysis w microscopic + reflex cultur     Plan: 1. We will send urine for urinalysis and culture.  I will treat her empirically with ciprofloxacin just for 3 days.  She is due to see gastroenterology today.  If the urine and culture does not show evidence of UTI, we will reevaluate.  I have told her that if symptoms do not improve with antibiotics, she should let us know. 2. Follow-up as previously scheduled in about 2 weeks.   Meds ordered this encounter  Medications  . ciprofloxacin (CIPRO) 500 MG tablet    Sig: Take 1 tablet (500 mg total) by mouth 2 (two) times daily.    Dispense:  6 tablet    Refill:  0    Kristy Hovsepian Luther Parody, MD

## 2020-07-07 NOTE — Progress Notes (Signed)
Maylon Peppers, M.D. Gastroenterology & Hepatology [facility] [address] [place], [place] [address]  Primary Care Physician: Doree Albee, MD [place] [id number]  I will communicate my assessment and recommendations to the referring MD via EMR.  Problems: 1. Abdominal pain, possibly related to duodenal ulcer 2. Recurrent nausea  History of Present Illness: Kristy Franklin is a 50 y.o. 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  The patient was last seen on 12/25/2019. At that time, the patient was given a prescription for Zofran as needed for episodes of nausea and vomiting.  She was also given prescription for sumatriptan for episodes of headache and was advised to follow-up with her PCP.  Patient reports that on 06/21/20 she presented new onset of abdominal pain in the R flank which did not migrate anywhere else. She had constant pain at that time and decided to go the ER next day.  She went to the ER at [facility] on 06/22/2020.  At that time she had CMP, CBC and UA checked which were within normal limits.  A CT of the abdomen and pelvis with IV contrast was performed which showed presence of changes suggestive of duodenal ulcer without any other alterations.  If this, the patient was given a prescription for famotidine 20 mg twice a day and Carafate.since this episode, the patient has avoided eating greassy food and spicy food, which she belives has helped to improve her symptoms. She reports that she has presented more recently pain only when she presses her abdomen.  She states that after eating she is now having a "hunger sensation" but no pain. Has very mild nausea episodes which gets controlled with Zofran, has not presented any vomiting episodes. She also reports sometimes she has some discomfort in her throat,  which she describes as burning as a burning mild pain despite taking pantoprazole 40 mg and famotidine 20 mg compliantly, but she states the pain got better after changing her diet. She takes pantoprazole usually after lunch.  Very occasionally she takes ibuprofen, but only when she has severe pain. Does not take it often.  The patient denies having any nausea, vomiting, fever, chills, hematochezia, melena, hematemesis, abdominal distention, abdominal pain, diarrhea, jaundice, pruritus or weight loss.  Last EGD: 04/24/2019 - multiple gastric polyps (5 fundic gland polyps removed), gastric biopsies showed mild gastritis neg HP Last Colonoscopy: 2020 - diverticulosis, hemorrhoids  Past Medical History: Past Medical History:  Diagnosis Date  . Abdominal pain    Right mid only with bowel movements  . Blood in urine   . Change in bowel movement    more green color  . Essential hypertension, benign 11/12/2018  . Family history of prostate cancer   . GERD (gastroesophageal reflux disease)   . Headache    after menstrual cycle every month  . History of gallstones   . Hot flashes due to surgical menopause 12/17/2018  . Hypertension   . Neck pain on right side    pulsating  . Numbness and tingling of right leg   . Obesity   . Obesity (BMI 30.0-34.9) 12/17/2018  . Ovarian cancer, left ([place])   . PPD positive   . Skin rash    Chest, forehead, left arm    Past Surgical History: Past Surgical History:  Procedure Laterality Date  . BIOPSY  04/24/2019   Procedure: BIOPSY;  Surgeon: Rogene Houston, MD;  [facility];  Service: Endoscopy;;  antrum  . CESAREAN SECTION     x2  . CHOLECYSTECTOMY     20 years ago  . COLONOSCOPY N/A 09/12/2018   Procedure: COLONOSCOPY;  Surgeon: Rogene Houston, MD;  Location: [facility];  Service: Endoscopy;  Laterality: N/A;  200  . ESOPHAGOGASTRODUODENOSCOPY N/A 04/24/2019   Procedure: ESOPHAGOGASTRODUODENOSCOPY (EGD);  Surgeon: Rogene Houston, MD;  Location: [facility];  Service: Endoscopy;  Laterality: N/A;  125  . IR IMAGING GUIDED PORT INSERTION  10/24/2017  . OMENTECTOMY N/A 10/02/2017   Procedure: OMENTECTOMY;  Surgeon: Everitt Amber, MD;  Location: [facility];  Service: Gynecology;  Laterality: N/A;  . OTHER SURGICAL HISTORY  07/2017   Left ovary removed  . POLYPECTOMY  04/24/2019   Procedure: POLYPECTOMY;  Surgeon: Rogene Houston, MD;  [facility];  Service: Endoscopy;;  gastric  . ROBOTIC ASSISTED TOTAL HYSTERECTOMY WITH BILATERAL SALPINGO OOPHERECTOMY Right 10/02/2017   Procedure: XI ROBOTIC ASSISTED TOTAL HYSTERECTOMY WITH RIGHT SALPINGO OOPHORECTOMY;  Surgeon: Everitt Amber, MD;  Location: [facility];  Service: Gynecology;  Laterality: Right;  . ROBOTIC PELVIC AND PARA-AORTIC LYMPH NODE DISSECTION Bilateral 10/02/2017   Procedure: XI ROBOTIC PELVIC LYMPH NODE DISSECTION;  Surgeon: Everitt Amber, MD;  Location: [facility];  Service: Gynecology;  Laterality: Bilateral;    Family History: Family History  Problem Relation Age of Onset  . Hypertension Mother   . Prostate cancer Father 39       d. 27  . Diabetes Paternal Aunt   . Heart attack Paternal Aunt   . Heart attack Paternal Grandmother 95  . Thyroid disease Other 14    Social History: Social History   Tobacco Use  Smoking Status Never Smoker  Smokeless Tobacco Never Used   Social History   Substance and Sexual Activity  Alcohol Use Never   Social History   Substance and Sexual Activity  Drug Use Never    Allergies: Allergies  Allergen Reactions  . Other Rash    Pork    Medications: Current Outpatient Medications  Medication Sig Dispense Refill  . amitriptyline (ELAVIL) 10 MG tablet Take 2 tablets (20 mg total) by mouth at bedtime. (Patient taking differently: Take 10 mg by mouth at bedtime.) 60 tablet 5  . Cholecalciferol (VITAMIN D-3) 125 MCG (5000 UT) TABS Take 10,000 Units by mouth daily.     . ciprofloxacin (CIPRO) 500 MG tablet Take 1  tablet (500 mg total) by mouth 2 (two) times daily. 6 tablet 0  . estradiol (ESTRACE) 0.1 MG/GM vaginal cream Place 1 Applicatorful vaginally 3 (three) times a week. 42.5 g 12  . estradiol (ESTRACE) 1 MG tablet Take 1 tablet (1 mg total) by mouth daily. 90 tablet 1  . famotidine (PEPCID) 20 MG tablet Take 1 tablet (20 mg total) by mouth 2 (two) times daily. 30 tablet 0  . lisinopril (ZESTRIL) 5 MG tablet Take 1 tablet (5 mg total) by mouth daily. 90 tablet 1  . ondansetron (ZOFRAN ODT) 4 MG disintegrating tablet Take 1 tablet (4 mg total) by mouth every 8 (eight) hours as needed for nausea or vomiting. 20 tablet 0  . pantoprazole (PROTONIX) 40 MG tablet Take 1 tablet (40 mg total) by mouth daily. 90 tablet 3  . progesterone (PROMETRIUM) 200 MG capsule Take 2 capsules by mouth once daily 180 capsule 0  . sucralfate (CARAFATE) 1  g tablet Take 1 tablet (1 g total) by mouth 4 (four) times daily -  with meals and at bedtime for 7 days. 28 tablet 1   No current facility-administered medications for this visit.    Review of Systems: GENERAL: negative for malaise, night sweats HEENT: No changes in hearing or vision, no nose bleeds or other nasal problems. NECK: Negative for lumps, goiter, pain and significant neck swelling RESPIRATORY: Negative for cough, wheezing CARDIOVASCULAR: Negative for chest pain, leg swelling, palpitations, orthopnea GI: SEE HPI MUSCULOSKELETAL: Negative for joint pain or swelling, back pain, and muscle pain. SKIN: Negative for lesions, rash PSYCH: Negative for sleep disturbance, mood disorder and recent psychosocial stressors. HEMATOLOGY Negative for prolonged bleeding, bruising easily, and swollen nodes. ENDOCRINE: Negative for cold or heat intolerance, polyuria, polydipsia and goiter. NEURO: negative for tremor, gait imbalance, syncope and seizures. The remainder of the review of systems is noncontributory.   Physical Exam: BP (!) 119/55 (BP Location: Left Arm,  Patient Position: Sitting, Cuff Size: Large)   Pulse 73   Temp 98.6 F (37 C) (Oral)   Ht 5\' 3"  (1.6 m)   Wt 171 lb (77.6 kg)   LMP 09/24/2017   BMI 30.29 kg/m  GENERAL: The patient is AO x3, in no acute distress. Obese. HEENT: Head is normocephalic and atraumatic. EOMI are intact. Mouth is well hydrated and without lesions. NECK: Supple. No masses LUNGS: Clear to auscultation. No presence of rhonchi/wheezing/rales. Adequate chest expansion HEART: RRR, normal s1 and s2. ABDOMEN: Soft, nontender, no guarding, no peritoneal signs, and nondistended. BS +. No masses. EXTREMITIES: Without any cyanosis, clubbing, rash, lesions or edema. NEUROLOGIC: AOx3, no focal motor deficit. SKIN: no jaundice, no rashes  Imaging/Labs: as above  I personally reviewed and interpreted the available labs, imaging and endoscopic files.  Impression and Plan: Kristy Franklin is a 50 y.o. 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  The patient presented new onset abdominal pain of unclear etiology but was found to have imaging findings concerning for a duodenal ulcer.  She is not taking frequently NSAIDs or any other insult medication.  It is possible that she may have a family which will have led to duodenal inflammation/ulceration.  We will need to evaluate this further with an EGD with possible gastric biopsies.  At that time we will check an H. pylori blood test as well and the patient is currently on a PPI and this could cause false negatives.  I advised the patient to continue taking her pantoprazole 40 mg every day, but she should ideally take it first in the morning.  She is currently taking famotidine, I advised her that once she runs out of this medication she can increase her pantoprazole to twice a day dosing until her next endoscopy.  Patient understood and agreed.  - Schedule EGD - Finish Carafate  prescription - Once she finishes famotidine, will need to increase pantoprazole to 40 mg twice a day - Instruction provided in the use of antireflux medication - patient should take medication in the morning 30-45 minutes before eating breakfast. Discussed avoidance of eating within 2 hours of lying down to sleep and benefit of blocks to elevate head of bed.  All questions were answered.      Harvel Quale, MD Gastroenterology and Hepatology [facility]

## 2020-07-08 LAB — URINALYSIS W MICROSCOPIC + REFLEX CULTURE
Bilirubin Urine: NEGATIVE
Glucose, UA: NEGATIVE
Hgb urine dipstick: NEGATIVE
Hyaline Cast: NONE SEEN /LPF
Ketones, ur: NEGATIVE
Leukocyte Esterase: NEGATIVE
Nitrites, Initial: NEGATIVE
Protein, ur: NEGATIVE
Specific Gravity, Urine: 1.018 (ref 1.001–1.035)
WBC, UA: NONE SEEN /HPF (ref 0–5)
pH: 6 (ref 5.0–8.0)

## 2020-07-08 LAB — NO CULTURE INDICATED

## 2020-07-16 ENCOUNTER — Other Ambulatory Visit (HOSPITAL_COMMUNITY)
Admission: RE | Admit: 2020-07-16 | Discharge: 2020-07-16 | Disposition: A | Discharge status: Home / Self Care | Payer: 59 | Source: Ambulatory Visit | Attending: Gastroenterology | Admitting: Gastroenterology

## 2020-07-16 ENCOUNTER — Other Ambulatory Visit: Payer: Self-pay

## 2020-07-16 DIAGNOSIS — Z01812 Encounter for preprocedural laboratory examination: Secondary | ICD-10-CM | POA: Insufficient documentation

## 2020-07-16 DIAGNOSIS — Z20822 Contact with and (suspected) exposure to covid-19: Secondary | ICD-10-CM | POA: Diagnosis not present

## 2020-07-17 LAB — SARS CORONAVIRUS 2 (TAT 6-24 HRS): SARS Coronavirus 2: NEGATIVE

## 2020-07-20 ENCOUNTER — Encounter (HOSPITAL_COMMUNITY): Admission: RE | Payer: Self-pay | Source: Home / Self Care

## 2020-07-20 ENCOUNTER — Encounter (INDEPENDENT_AMBULATORY_CARE_PROVIDER_SITE_OTHER): Payer: Self-pay | Admitting: Nurse Practitioner

## 2020-07-20 ENCOUNTER — Ambulatory Visit (HOSPITAL_COMMUNITY): Admission: RE | Admit: 2020-07-20 | Payer: 59 | Source: Home / Self Care | Admitting: Gastroenterology

## 2020-07-20 ENCOUNTER — Ambulatory Visit (INDEPENDENT_AMBULATORY_CARE_PROVIDER_SITE_OTHER): Payer: 59 | Admitting: Nurse Practitioner

## 2020-07-20 ENCOUNTER — Other Ambulatory Visit: Payer: Self-pay

## 2020-07-20 VITALS — BP 122/82 | HR 67 | Temp 97.3°F | Ht 63.0 in | Wt 174.0 lb

## 2020-07-20 DIAGNOSIS — E559 Vitamin D deficiency, unspecified: Secondary | ICD-10-CM

## 2020-07-20 DIAGNOSIS — K6289 Other specified diseases of anus and rectum: Secondary | ICD-10-CM | POA: Diagnosis not present

## 2020-07-20 DIAGNOSIS — R21 Rash and other nonspecific skin eruption: Secondary | ICD-10-CM

## 2020-07-20 DIAGNOSIS — I10 Essential (primary) hypertension: Secondary | ICD-10-CM

## 2020-07-20 DIAGNOSIS — R1013 Epigastric pain: Secondary | ICD-10-CM

## 2020-07-20 DIAGNOSIS — G43919 Migraine, unspecified, intractable, without status migrainosus: Secondary | ICD-10-CM

## 2020-07-20 SURGERY — ESOPHAGOGASTRODUODENOSCOPY (EGD) WITH PROPOFOL
Anesthesia: Monitor Anesthesia Care

## 2020-07-20 MED ORDER — AMITRIPTYLINE HCL 10 MG PO TABS: 10.0000 mg | ORAL_TABLET | Freq: Every day | ORAL | 0 refills | Status: DC

## 2020-07-20 MED ORDER — PANTOPRAZOLE SODIUM 40 MG PO TBEC: 40.0000 mg | DELAYED_RELEASE_TABLET | Freq: Two times a day (BID) | ORAL | 0 refills | Status: DC

## 2020-07-20 MED ORDER — CLOTRIMAZOLE-BETAMETHASONE 1-0.05 % EX CREA: 1.0000 "application " | TOPICAL_CREAM | Freq: Two times a day (BID) | CUTANEOUS | 0 refills | Status: AC

## 2020-07-20 NOTE — Progress Notes (Signed)
Subjective:  Patient ID: Kristy Franklin, female    DOB: 1970/03/07  Age: 50 y.o. MRN: [id number]  CC:  Chief Complaint  Patient presents with  . Abdominal Pain  . Hypertension  . Headache  . Rash  . Other    Vitamin D deficiency      HPI  This patient arrives today for the above.  Abdominal pain: She is under investigation for cause of abdominal pain.  She has been evaluated emergency department and has had imaging done which showed duodenal ulcer, but otherwise no specific findings noted.  She was post to have EGD with GI today, however this had to be canceled.  She tells me she plans on calling them back the end of this week or early next week to get rescheduled.  She does continue on pantoprazole twice a day.  She tells me overall the pain is better but still present especially after she eats.  She does report a regular bowel movement on a daily basis, she does report having to strain at times.  She denies any nausea or vomiting.  She denies any bloody stools or black tarry stools.  Hypertension: She continues on lisinopril 5 mg daily.  Headache: She has a history of migraines but reports migraines are well controlled on current medications.  Rash: She does report a rash in itchy area to her neck has erupted over the last week or so.  She does not report any obvious exposures to irritants.  No change in laundry detergent or soap.  She does have a dog that does go outside.  She is not sure if the dog was exposed to any irritants.  She denies any pain.  Vitamin D deficiency: She has history of vitamin D deficiency and continues on 5000 IUs of vitamin D3 daily.  Last serum check showed a level of 60 and this was done about 1 year ago.  Past Medical History:  Diagnosis Date  . Abdominal pain    Right mid only with bowel movements  . Blood in urine   . Change in bowel movement    more green color  . Essential hypertension, benign 11/12/2018  . Family history of prostate  cancer   . GERD (gastroesophageal reflux disease)   . Headache    after menstrual cycle every month  . History of gallstones   . Hot flashes due to surgical menopause 12/17/2018  . Hypertension   . Neck pain on right side    pulsating  . Numbness and tingling of right leg   . Obesity   . Obesity (BMI 30.0-34.9) 12/17/2018  . Ovarian cancer, left ([place])   . PPD positive   . Skin rash    Chest, forehead, left arm      Family History  Problem Relation Age of Onset  . Hypertension Mother   . Prostate cancer Father 67       d. 63  . Diabetes Paternal Aunt   . Heart attack Paternal Aunt   . Heart attack Paternal Grandmother 95  . Thyroid disease Other 28    Social History   Social History Narrative   Married 26 years.Lives with husband and daughter.Homemaker.   Social History   Tobacco Use  . Smoking status: Never Smoker  . Smokeless tobacco: Never Used  Substance Use Topics  . Alcohol use: Never     Current Meds  Medication Sig  . Cholecalciferol (VITAMIN D-3) 125 MCG (5000 UT) TABS Take 10,000  Units by mouth daily.   . clotrimazole-betamethasone (LOTRISONE) cream Apply 1 application topically 2 (two) times daily.  Marland Kitchen estradiol (ESTRACE) 0.1 MG/GM vaginal cream Place 1 Applicatorful vaginally 3 (three) times a week. (Patient taking differently: Place 1 Applicatorful vaginally daily as needed (dryness).)  . estradiol (ESTRACE) 1 MG tablet Take 1 tablet (1 mg total) by mouth daily.  Marland Kitchen lisinopril (ZESTRIL) 5 MG tablet Take 1 tablet (5 mg total) by mouth daily.  . ondansetron (ZOFRAN ODT) 4 MG disintegrating tablet Take 1 tablet (4 mg total) by mouth every 8 (eight) hours as needed for nausea or vomiting.  . progesterone (PROMETRIUM) 200 MG capsule Take 2 capsules by mouth once daily (Patient taking differently: Take 400 mg by mouth daily.)  . [DISCONTINUED] amitriptyline (ELAVIL) 10 MG tablet Take 2 tablets (20 mg total) by mouth at bedtime. (Patient taking differently:  Take 10 mg by mouth at bedtime.)  . [DISCONTINUED] pantoprazole (PROTONIX) 40 MG tablet Take 1 tablet (40 mg total) by mouth daily. (Patient taking differently: Take 40 mg by mouth 2 (two) times daily before a meal.)    ROS:  Review of Systems  Constitutional: Negative for fever.  Respiratory: Negative for shortness of breath.   Cardiovascular: Negative for chest pain.  Gastrointestinal: Positive for abdominal pain, constipation and heartburn. Negative for blood in stool, melena, nausea and vomiting.  Skin: Positive for itching. Negative for rash.  Neurological: Negative for dizziness and headaches.     Objective:   Today's Vitals: BP 122/82   Pulse 67   Temp (!) 97.3 F (36.3 C)   Ht 5\' 3"  (1.6 m)   Wt 174 lb (78.9 kg)   LMP 09/24/2017   SpO2 97%   BMI 30.82 kg/m  Vitals with BMI 07/20/2020 07/07/2020 07/07/2020  Height 5\' 3"  5\' 3"  5\' 3"   Weight 174 lbs 171 lbs 172 lbs  BMI 30.83 96.7 89.38  Systolic 101 751 025  Diastolic 82 55 71  Pulse 67 73 77     Physical Exam Vitals reviewed.  Constitutional:      General: She is not in acute distress.    Appearance: Normal appearance.  HENT:     Head: Normocephalic and atraumatic.  Neck:     Vascular: No carotid bruit.  Cardiovascular:     Rate and Rhythm: Normal rate and regular rhythm.     Pulses: Normal pulses.     Heart sounds: Normal heart sounds.  Pulmonary:     Effort: Pulmonary effort is normal.     Breath sounds: Normal breath sounds.  Abdominal:     General: Bowel sounds are normal. There is no distension.     Palpations: Abdomen is soft.     Tenderness: There is no abdominal tenderness.  Skin:    General: Skin is warm and dry.  Neurological:     General: No focal deficit present.     Mental Status: She is alert and oriented to person, place, and time.  Psychiatric:        Mood and Affect: Mood normal.        Behavior: Behavior normal.        Judgment: Judgment normal.          Assessment and  Plan   1. Rash   2. Rectal pain   3. Intractable migraine without status migrainosus, unspecified migraine type   4. Essential hypertension, benign   5. Vitamin D deficiency disease   6. Abdominal pain, epigastric  Plan: 1.  Unclear etiology, will trial use of Lotrisone cream to see if the rash improves.  We did discuss she should use it no longer than 14 days if rash or itching persists past that point we will need to do further investigation.  She is also to let us know if the rash or itching worsens between now and in the next 14 days.  She tells me she understands. 2.,  6.  She will continue taking her medications as prescribed and follow-up with GI as scheduled.  I to have encouraged her to call them back to get her EGD rescheduled to soon as possible. 3.  Appears stable at this time no further changes to medications. 4.  Blood pressure well controlled on current regimen she will continue lisinopril. 5.  I would like to check a vitamin D serum check today, however the patient asked to wait until the next appointment because she is had frequent blood work recently as part of the work-up for abdominal pain.  I am agreeable to this so we will hold off on checking blood work until next office visit.   Tests ordered No orders of the defined types were placed in this encounter.     Meds ordered this encounter  Medications  . clotrimazole-betamethasone (LOTRISONE) cream    Sig: Apply 1 application topically 2 (two) times daily.    Dispense:  30 g    Refill:  0    Order Specific Question:   Supervising Provider    Answer:   Hurshel Party C [[id number]]  . amitriptyline (ELAVIL) 10 MG tablet    Sig: Take 1 tablet (10 mg total) by mouth at bedtime.    Dispense:  90 tablet    Refill:  0    Order Specific Question:   Supervising Provider    Answer:   Hurshel Party C [[id number]]  . pantoprazole (PROTONIX) 40 MG tablet    Sig: Take 1 tablet (40 mg total) by mouth 2 (two) times daily  before a meal.    Dispense:  180 tablet    Refill:  0    Order Specific Question:   Supervising Provider    Answer:   Doree Albee [[id number]]    Patient to follow-up in 3 months or sooner as needed.  Ailene Ards, NP

## 2020-07-21 ENCOUNTER — Ambulatory Visit (INDEPENDENT_AMBULATORY_CARE_PROVIDER_SITE_OTHER): Payer: 59 | Admitting: Internal Medicine

## 2020-08-09 ENCOUNTER — Other Ambulatory Visit: Payer: Self-pay

## 2020-08-09 ENCOUNTER — Inpatient Hospital Stay: Payer: 59 | Attending: Gynecologic Oncology

## 2020-08-09 DIAGNOSIS — Z79899 Other long term (current) drug therapy: Secondary | ICD-10-CM | POA: Insufficient documentation

## 2020-08-09 DIAGNOSIS — Z9049 Acquired absence of other specified parts of digestive tract: Secondary | ICD-10-CM | POA: Insufficient documentation

## 2020-08-09 DIAGNOSIS — Z8249 Family history of ischemic heart disease and other diseases of the circulatory system: Secondary | ICD-10-CM | POA: Diagnosis not present

## 2020-08-09 DIAGNOSIS — C562 Malignant neoplasm of left ovary: Secondary | ICD-10-CM | POA: Insufficient documentation

## 2020-08-09 DIAGNOSIS — Z90721 Acquired absence of ovaries, unilateral: Secondary | ICD-10-CM | POA: Insufficient documentation

## 2020-08-09 DIAGNOSIS — Z8349 Family history of other endocrine, nutritional and metabolic diseases: Secondary | ICD-10-CM | POA: Insufficient documentation

## 2020-08-09 DIAGNOSIS — Z90710 Acquired absence of both cervix and uterus: Secondary | ICD-10-CM | POA: Insufficient documentation

## 2020-08-09 DIAGNOSIS — Z8042 Family history of malignant neoplasm of prostate: Secondary | ICD-10-CM | POA: Insufficient documentation

## 2020-08-09 DIAGNOSIS — Z833 Family history of diabetes mellitus: Secondary | ICD-10-CM | POA: Insufficient documentation

## 2020-08-09 DIAGNOSIS — K269 Duodenal ulcer, unspecified as acute or chronic, without hemorrhage or perforation: Secondary | ICD-10-CM | POA: Insufficient documentation

## 2020-08-09 DIAGNOSIS — I10 Essential (primary) hypertension: Secondary | ICD-10-CM | POA: Diagnosis not present

## 2020-08-09 DIAGNOSIS — C541 Malignant neoplasm of endometrium: Secondary | ICD-10-CM | POA: Insufficient documentation

## 2020-08-09 MED ORDER — SODIUM CHLORIDE 0.9% FLUSH
10.0000 mL | Freq: Once | INTRAVENOUS | Status: AC
  Administered 2020-08-09: 10 mL
  Filled 2020-08-09: qty 10

## 2020-08-09 MED ORDER — ANTICOAGULANT SODIUM CITRATE 4% (200MG/5ML) IV SOLN
5.0000 mL | Freq: Once | Status: AC
Start: 2020-08-09 — End: 2020-08-09
  Administered 2020-08-09: 5 mL
  Filled 2020-08-09: qty 5

## 2020-08-10 LAB — CA 125: Cancer Antigen (CA) 125: 6.8 U/mL (ref 0.0–38.1)

## 2020-08-16 ENCOUNTER — Other Ambulatory Visit: Payer: Self-pay

## 2020-08-16 ENCOUNTER — Inpatient Hospital Stay (HOSPITAL_BASED_OUTPATIENT_CLINIC_OR_DEPARTMENT_OTHER): Payer: 59 | Admitting: Gynecologic Oncology

## 2020-08-16 VITALS — BP 135/50 | HR 75 | Temp 97.6°F | Resp 18 | Ht 63.0 in | Wt 168.8 lb

## 2020-08-16 DIAGNOSIS — C562 Malignant neoplasm of left ovary: Secondary | ICD-10-CM | POA: Diagnosis not present

## 2020-08-16 DIAGNOSIS — C541 Malignant neoplasm of endometrium: Secondary | ICD-10-CM | POA: Diagnosis not present

## 2020-08-16 NOTE — Progress Notes (Signed)
Follow-up Note: Gyn-Onc  Consult was intially requested by Dr. Barrie Dunker for the evaluation of Kristy Franklin 50 y.o. female  CC:  Chief Complaint  Patient presents with   Ovarian cancer on left [facility])   Endometrial cancer [facility])   Seen with translator (spanish)  Assessment/Plan:  Kristy Franklin  is a 50 y.o.  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 BRCA negative.  S/p completion of chemotherapy with 6 cycles adjuvant carboplatin and paclitaxel in December, 2019.   We will see her back at 6 monthly intervals with CA 125 until December, 2024. I explained that I am leaving the practice in the fall, therefore she will follow-up with my partner, Dr Lahoma Crocker for CA 125 and pelvic exam.  HPI: Kristy Franklin is a 50 year old P2 who is seen in consultation at the request of Dr Barrie Dunker for high grade serous ovarian cancer, clinical stage IC.  The patient reports a history of having seen the emergency room in [place] in May or June 2019 when she developed left lower quadrant pain.  During the ER visit an ultrasound performed which identified an adnexal mass on the left, with no signs of torsion.  She was then seen in the offices of Dr. Barrie Dunker in June 2019 and a plan was made to proceed with surgical removal of the adnexa.  On August 23, 2017 she was taken to the operating room for a laparoscopic left salpingo-oophorectomy.  Review of the operative note suggest that intraoperative findings were significant for a 6 cm left adnexal mass that was stuck in the cul-de-sac with some paraovarian adhesions noted.  It did not appear to be malignant in appearance to the surgeons at the time of surgery.  The upper abdomen, diaphragms, and omentum were commented on as looking normal.  There was no ascites.  The left tube and ovary was removed, however there was some fragmentation of the  specimen during removal, and therefore the left tube and ovary were sent as 2 specimens.  The first specimen is labeled ovary biopsy for frozen section which revealed serous carcinoma and the second specimen is labeled left ovary on permanent this revealed serous carcinoma.  The operative note reports that the intraoperative diagnosis was borderline tumor, or reviewing the pathology report it states that the intraoperative diagnosis was left ovarian biopsy epithelial neoplasm at least borderline, cannot exclude carcinoma.  The frozen section of the second specimen was the same (epithelial neoplasm at least borderline cannot exclude carcinoma.).  The patient was seen back in the office by Dr. Barrie Dunker on September 06, 2017 and delivered her diagnosis of carcinoma.  At that time she was referred for consultation with gynecologic oncology.  CT chest/abd/pelvis on 09/24/17 (prior to staging surgery) showed no evidence of gross metastatic disease.  CA 125 on 09/25/17 was elevated at 66.4.  On 10/02/17 she underwent robotic assisted total hysterectomy, RSO, omentectomy, bilateral pelvic lymphadenectomy (PA node dissection not feasible due to body habitus, and no suspicious nodes on preop imaging). Final pathology revealed residual carcinoma on the surface of the uterus (serosa) and separate additional endometrioid carcinoma in endometrium (stage I, thought to be second primary). Metastatic carcinoma (consistent with serous) in one of 5 left pelvic lymph nodes (right side all benign). Omentum was benign (though atypical cells were identified in peritoneal washings).  She was determined to most likely have synchronous endometrial (stage  I) and ovarian (stage IIIC) cancers of endometrioid and high grade serous histologies respectively after pathology and multidisciplinary conference review. Recommendation was for 6 cycles of adjuvant carboplatin and paclitaxel chemotherapy with genetics consultation.  She commenced  chemotherapy with Dr Alvy Bimler on 10/26/17.  BRCA and HRD testing negative.   She completed 6 cycles of adjuvant carboplatin paclitaxel chemotherapy on February 12, 2018.  Posttreatment Ca1 25 was normal at 9.3.  She began developing mid right abdominal wall discomfort in November December 2019. A CT abd/pelvis on March 13 2018 which showed a 12.3 cm left pelvic sidewall cystic structure favoring lymphocele.  This was drained by CT guided drainage on therapy for 2020.  The patient reported her symptoms improved after this time.  Ca1 25 on June 20, 2018 was normal at 8.2.  CT abd/pelvis on 11/19/18 showed no residual fluid collections and no recurrence of cancer.  CT abd/pelvis on 02/26/19 showed slight enlargement of external iliac nodes (nonspecific).  She also had signs of stomach thickening and patulous duodenal bulb.   CA 125 was 6 on 02/18/19.   CA 125 was 7.1 on 05/21/19.  CA 125 was 7.1 on 08/21/19.  CT scan of the abdomen and pelvis on August 21, 2019 showed no acute findings within the abdomen and pelvis, no findings of recurrent tumor or metastatic disease, morphologically benign-appearing left external iliac lymph node measuring 9 mm that was unchanged.  CA 125 was 7.7 on 11/17/19.  CA 125 was 7.1 on 02/13/20.  CA 125 was 6.8 on 08/09/20  Interval Hx:  She has chronic symptoms of intermittent abdominal pains. She had abdominal pains in April, 2022 and presented to the [facility] where a CT abd/pelvis was performed and showed no evidence of recurrence but did identify a duodenal ulcer. She was referred to GI and was awaiting EGD.  Current Meds:  Outpatient Encounter Medications as of 08/16/2020  Medication Sig   amitriptyline (ELAVIL) 10 MG tablet Take 1 tablet (10 mg total) by mouth at bedtime.   Cholecalciferol (VITAMIN D-3) 125 MCG (5000 UT) TABS Take 10,000 Units by mouth daily.    clotrimazole-betamethasone (LOTRISONE) cream Apply 1 application topically 2 (two)  times daily.   estradiol (ESTRACE) 0.1 MG/GM vaginal cream Place 1 Applicatorful vaginally 3 (three) times a week. (Patient taking differently: Place 1 Applicatorful vaginally daily as needed (dryness).)   estradiol (ESTRACE) 1 MG tablet Take 1 tablet (1 mg total) by mouth daily.   lisinopril (ZESTRIL) 5 MG tablet Take 1 tablet (5 mg total) by mouth daily.   ondansetron (ZOFRAN ODT) 4 MG disintegrating tablet Take 1 tablet (4 mg total) by mouth every 8 (eight) hours as needed for nausea or vomiting.   pantoprazole (PROTONIX) 40 MG tablet Take 1 tablet (40 mg total) by mouth 2 (two) times daily before a meal.   progesterone (PROMETRIUM) 200 MG capsule Take 2 capsules by mouth once daily (Patient taking differently: Take 400 mg by mouth daily.)   No facility-administered encounter medications on file as of 08/16/2020.    Allergy:  Allergies  Allergen Reactions   Other Rash    Pork    Social Hx:   Social History   Socioeconomic History   Marital status: Married    Spouse name: Media planner   Number of children: 2   Years of education: Not on file   Highest education level: Not on file  Occupational History   Occupation: unemployed  Tobacco Use   Smoking status: Never  Smokeless tobacco: Never  Vaping Use   Vaping Use: Never used  Substance and Sexual Activity   Alcohol use: Never   Drug use: Never   Sexual activity: Not Currently    Comment: calendar  Other Topics Concern   Not on file  Social History Narrative   Married 26 years.Lives with husband and daughter.Homemaker.   Social Determinants of Health   Financial Resource Strain: Not on file  Food Insecurity: Not on file  Transportation Needs: Not on file  Physical Activity: Not on file  Stress: Not on file  Social Connections: Not on file  Intimate Partner Violence: Not on file    Past Surgical Hx:  Past Surgical History:  Procedure Laterality Date   BIOPSY  04/24/2019   Procedure: BIOPSY;  Surgeon: Malissa Hippo, MD;  [facility];  Service: Endoscopy;;  antrum   CESAREAN SECTION     x2   CHOLECYSTECTOMY     20 years ago   COLONOSCOPY N/A 09/12/2018   Procedure: COLONOSCOPY;  Surgeon: Malissa Hippo, MD;  [facility];  Service: Endoscopy;  Laterality: N/A;  200   ESOPHAGOGASTRODUODENOSCOPY N/A 04/24/2019   Procedure: ESOPHAGOGASTRODUODENOSCOPY (EGD);  Surgeon: Malissa Hippo, MD;  [facility];  Service: Endoscopy;  Laterality: N/A;  125   IR IMAGING GUIDED PORT INSERTION  10/24/2017   OMENTECTOMY N/A 10/02/2017   Procedure: OMENTECTOMY;  Surgeon: Adolphus Birchwood, MD;  Location: [facility];  Service: Gynecology;  Laterality: N/A;   OTHER SURGICAL HISTORY  07/2017   Left ovary removed   POLYPECTOMY  04/24/2019   Procedure: POLYPECTOMY;  Surgeon: Malissa Hippo, MD;  [facility];  Service: Endoscopy;;  gastric   ROBOTIC ASSISTED TOTAL HYSTERECTOMY WITH BILATERAL SALPINGO OOPHERECTOMY Right 10/02/2017   Procedure: XI ROBOTIC ASSISTED TOTAL HYSTERECTOMY WITH RIGHT SALPINGO OOPHORECTOMY;  Surgeon: Adolphus Birchwood, MD;  [facility];  Service: Gynecology;  Laterality: Right;   ROBOTIC PELVIC AND PARA-AORTIC LYMPH NODE DISSECTION Bilateral 10/02/2017   Procedure: XI ROBOTIC PELVIC LYMPH NODE DISSECTION;  Surgeon: Adolphus Birchwood, MD;  [facility]: [facility];  Service: Gynecology;  Laterality: Bilateral;    Past Medical Hx:  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   Hypertension    Neck pain on right side    pulsating   Numbness and tingling of right leg    Obesity    Obesity (BMI 30.0-34.9)  12/17/2018   Ovarian cancer, left (HCC)    PPD positive    Skin rash    Chest, forehead, left arm    Past Gynecological History:  C/s x 2. No history of abnormal paps.  Patient's last menstrual period was 09/24/2017.  Family Hx:  Family History  Problem Relation Age of Onset   Hypertension Mother    Prostate cancer Father 24       d. 57   Diabetes Paternal Aunt    Heart attack Paternal Aunt    Heart attack Paternal Grandmother 34   Thyroid disease Other 26  Review of Systems:  Constitutional  Feels well,    ENT Normal appearing ears and nares bilaterally Skin/Breast  No rash, sores, jaundice, itching, dryness Cardiovascular  No chest pain, shortness of breath, or edema  Pulmonary  No cough or wheeze.  Gastro Intestinal  No nausea, vomitting, or diarrhoea. No bright red blood per rectum, change in bowel movement, or constipation. Genito Urinary  No frequency, urgency, dysuria, no pain, no bleeding. Musculo Skeletal  No myalgia, arthralgia, joint swelling or pain  Neurologic  No weakness, numbness, change in gait,  Psychology  No depression, anxiety, insomnia.   Vitals:  Blood pressure (!) 135/50, pulse 75, temperature 97.6 F (36.4 C), temperature source Tympanic, resp. rate 18, height $RemoveBe'5\' 3"'sYbBhpchr$  (1.6 m), weight 168 lb 12.8 oz (76.6 kg), last menstrual period 09/24/2017, SpO2 100 %.  Physical Exam: WD in NAD Neck  Supple NROM, without any enlargements.  Lymph Node Survey No cervical supraclavicular or inguinal adenopathy Cardiovascular  Pulse normal rate, regularity and rhythm. S1 and S2 normal.  Lungs  Clear to auscultation bilateraly, without wheezes/crackles/rhonchi. Good air movement.  Skin  No rash/lesions/breakdown  Psychiatry  Alert and oriented to person, place, and time  Abdomen  Normoactive bowel sounds, abdomen soft, non-tender and obese without evidence of hernia. Incisions soft. Back No CVA tenderness Genito Urinary  Vaginal cuff in tact,  smooth, no blood, no masses  Rectal  deferred Extremities  No bilateral cyanosis, clubbing or edema.    Thereasa Solo, MD  08/16/2020, 1:29 PM

## 2020-08-16 NOTE — Patient Instructions (Signed)
Plan to follow up with Dr. Lahoma Crocker as scheduled or sooner if needed. Please call the office for any needs or new persistent symptoms at [phone].  Symptoms to report to your health care team include vaginal bleeding, rectal bleeding, bloating, weight loss without effort, new and persistent pain, new and  persistent fatigue, new leg swelling, new masses (i.e., bumps in your neck or groin), new and persistent cough, new and persistent nausea and vomiting, change in bowel or bladder habits, and any other concerns.

## 2020-09-10 ENCOUNTER — Encounter (INDEPENDENT_AMBULATORY_CARE_PROVIDER_SITE_OTHER): Payer: Self-pay

## 2020-09-10 ENCOUNTER — Other Ambulatory Visit (INDEPENDENT_AMBULATORY_CARE_PROVIDER_SITE_OTHER): Payer: Self-pay

## 2020-09-20 ENCOUNTER — Other Ambulatory Visit: Payer: Self-pay

## 2020-09-20 ENCOUNTER — Inpatient Hospital Stay: Payer: 59 | Attending: Gynecologic Oncology

## 2020-09-20 DIAGNOSIS — Z452 Encounter for adjustment and management of vascular access device: Secondary | ICD-10-CM | POA: Diagnosis not present

## 2020-09-20 DIAGNOSIS — C562 Malignant neoplasm of left ovary: Secondary | ICD-10-CM | POA: Diagnosis not present

## 2020-09-20 MED ORDER — HEPARIN SOD (PORK) LOCK FLUSH 100 UNIT/ML IV SOLN
500.0000 [IU] | Freq: Once | INTRAVENOUS | Status: DC
  Filled 2020-09-20: qty 5

## 2020-09-20 MED ORDER — SODIUM CHLORIDE 0.9% FLUSH
10.0000 mL | Freq: Once | INTRAVENOUS | Status: AC
  Administered 2020-09-20: 10 mL
  Filled 2020-09-20: qty 10

## 2020-09-20 MED ORDER — HEPARIN SOD (PORK) LOCK FLUSH 100 UNIT/ML IV SOLN
500.0000 [IU] | Freq: Once | INTRAVENOUS | Status: AC
  Administered 2020-09-20: 500 [IU]
  Filled 2020-09-20: qty 5

## 2020-09-20 NOTE — Progress Notes (Signed)
Patient stated that she does NOT have an allergy to pork and requested heparin with port flushes from now on. Talked with Hassan Rowan RN and she helped me change the allergies in the chart.  Patient feels okay after receiving heparin flush.

## 2020-09-24 ENCOUNTER — Other Ambulatory Visit: Payer: Self-pay

## 2020-09-24 ENCOUNTER — Encounter (HOSPITAL_COMMUNITY): Payer: Self-pay | Admitting: Gastroenterology

## 2020-09-24 ENCOUNTER — Encounter (HOSPITAL_COMMUNITY)
Admission: RE | Disposition: A | Discharge status: Home / Self Care | Payer: Self-pay | Source: Home / Self Care | Attending: Gastroenterology

## 2020-09-24 ENCOUNTER — Ambulatory Visit (HOSPITAL_COMMUNITY): Payer: 59 | Admitting: Anesthesiology

## 2020-09-24 ENCOUNTER — Ambulatory Visit (HOSPITAL_COMMUNITY)
Admission: RE | Admit: 2020-09-24 | Discharge: 2020-09-24 | Disposition: A | Discharge status: Home / Self Care | Payer: 59 | Attending: Gastroenterology | Admitting: Gastroenterology

## 2020-09-24 DIAGNOSIS — K319 Disease of stomach and duodenum, unspecified: Secondary | ICD-10-CM | POA: Insufficient documentation

## 2020-09-24 DIAGNOSIS — Z8719 Personal history of other diseases of the digestive system: Secondary | ICD-10-CM | POA: Insufficient documentation

## 2020-09-24 DIAGNOSIS — Z79890 Hormone replacement therapy (postmenopausal): Secondary | ICD-10-CM | POA: Insufficient documentation

## 2020-09-24 DIAGNOSIS — Z90721 Acquired absence of ovaries, unilateral: Secondary | ICD-10-CM | POA: Insufficient documentation

## 2020-09-24 DIAGNOSIS — R1013 Epigastric pain: Secondary | ICD-10-CM | POA: Diagnosis not present

## 2020-09-24 DIAGNOSIS — Z8543 Personal history of malignant neoplasm of ovary: Secondary | ICD-10-CM | POA: Insufficient documentation

## 2020-09-24 DIAGNOSIS — Z79899 Other long term (current) drug therapy: Secondary | ICD-10-CM | POA: Diagnosis not present

## 2020-09-24 DIAGNOSIS — K2950 Unspecified chronic gastritis without bleeding: Secondary | ICD-10-CM | POA: Insufficient documentation

## 2020-09-24 DIAGNOSIS — Z9049 Acquired absence of other specified parts of digestive tract: Secondary | ICD-10-CM | POA: Diagnosis not present

## 2020-09-24 HISTORY — PX: BIOPSY: SHX5522

## 2020-09-24 HISTORY — PX: ESOPHAGOGASTRODUODENOSCOPY (EGD) WITH PROPOFOL: SHX5813

## 2020-09-24 SURGERY — ESOPHAGOGASTRODUODENOSCOPY (EGD) WITH PROPOFOL
Anesthesia: General

## 2020-09-24 MED ORDER — STERILE WATER FOR IRRIGATION IR SOLN
Status: DC | PRN
  Administered 2020-09-24: 100 mL

## 2020-09-24 MED ORDER — LIDOCAINE HCL (CARDIAC) PF 100 MG/5ML IV SOSY
PREFILLED_SYRINGE | INTRAVENOUS | Status: DC | PRN
Start: 2020-09-24 — End: 2020-09-24
  Administered 2020-09-24: 100 mg via INTRAVENOUS

## 2020-09-24 MED ORDER — PROPOFOL 10 MG/ML IV BOLUS
INTRAVENOUS | Status: DC | PRN
Start: 2020-09-24 — End: 2020-09-24
  Administered 2020-09-24: 100 mg via INTRAVENOUS
  Administered 2020-09-24 (×2): 50 mg via INTRAVENOUS

## 2020-09-24 MED ORDER — PANTOPRAZOLE SODIUM 40 MG PO TBEC: 40.0000 mg | DELAYED_RELEASE_TABLET | Freq: Every day | ORAL | Status: DC

## 2020-09-24 MED ORDER — LACTATED RINGERS IV SOLN: INTRAVENOUS | Status: DC | PRN

## 2020-09-24 NOTE — H&P (Addendum)
Kristy Franklin is an 50 y.o. female.   Chief Complaint: epigastric pain HPI: 50 y/o F with PMH high grade serous ovarian cancer clinical stage IC s/p LSO, who presents for follow up of abdominal pain.  Patient states that she has presented persistent epigastric abdominal pain since she presented her initial episode of pain in the right flank in April 2022.  States that the flank pain has got better but she now reports persistent pain, which is mild in nature in the epigastric area.  Denies having any nausea, vomiting, fever, chills, melena or hematochezia.  Last EGD: 04/24/2019 - multiple gastric polyps (5 fundic gland polyps removed), gastric biopsies showed mild gastritis neg HP  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   Hypertension    Neck pain on right side    pulsating   Numbness and tingling of right leg    Obesity    Obesity (BMI 30.0-34.9) 12/17/2018   Ovarian cancer, left (HCC)    PPD positive    Skin rash    Chest, forehead, left arm    Past Surgical History:  Procedure Laterality Date   BIOPSY  04/24/2019   Procedure: BIOPSY;  Surgeon: Rogene Houston, MD;  [facility];  Service: Endoscopy;;  antrum   CESAREAN SECTION     x2   CHOLECYSTECTOMY     20 years ago   COLONOSCOPY N/A 09/12/2018   Procedure: COLONOSCOPY;  Surgeon: Rogene Houston, MD;  [facility];  Service: Endoscopy;  Laterality: N/A;  200   ESOPHAGOGASTRODUODENOSCOPY N/A 04/24/2019   Procedure: ESOPHAGOGASTRODUODENOSCOPY (EGD);  Surgeon: Rogene Houston, MD;  [facility];  Service: Endoscopy;   Laterality: N/A;  125   IR IMAGING GUIDED PORT INSERTION  10/24/2017   OMENTECTOMY N/A 10/02/2017   Procedure: OMENTECTOMY;  Surgeon: Everitt Amber, MD;  [facility];  Service: Gynecology;  Laterality: N/A;   OTHER SURGICAL HISTORY  07/2017   Left ovary removed   POLYPECTOMY  04/24/2019   Procedure: POLYPECTOMY;  Surgeon: Rogene Houston, MD;  [facility];  Service: Endoscopy;;  gastric   ROBOTIC ASSISTED TOTAL HYSTERECTOMY WITH BILATERAL SALPINGO OOPHERECTOMY Right 10/02/2017   Procedure: XI ROBOTIC ASSISTED TOTAL HYSTERECTOMY WITH RIGHT SALPINGO OOPHORECTOMY;  Surgeon: Everitt Amber, MD;  [facility];  Service: Gynecology;  Laterality: Right;   ROBOTIC PELVIC AND PARA-AORTIC LYMPH NODE DISSECTION Bilateral 10/02/2017   Procedure: XI ROBOTIC PELVIC LYMPH NODE DISSECTION;  Surgeon: Everitt Amber, MD;  [facility];  Service: Gynecology;  Laterality: Bilateral;    Family History  Problem Relation Age of Onset   Hypertension Mother    Prostate cancer Father 15       d. 70   Diabetes Paternal Aunt    Heart attack Paternal Aunt    Heart attack Paternal Grandmother 46   Thyroid disease Other 34   Social History:  reports that she has never smoked. She has never used smokeless tobacco. She reports that she does not drink alcohol and does not  use drugs.  Allergies: No Active Allergies  Medications Prior to Admission  Medication Sig Dispense Refill   amitriptyline (ELAVIL) 10 MG tablet Take 1 tablet (10 mg total) by mouth at bedtime. 90 tablet 0   Cholecalciferol (VITAMIN D-3) 125 MCG (5000 UT) TABS Take 10,000 Units by mouth daily.      clotrimazole-betamethasone (LOTRISONE) cream Apply 1 application topically 2 (two) times daily. (Patient taking differently: Apply 1 application topically daily.) 30 g 0   estradiol (ESTRACE) 0.1 MG/GM vaginal cream Place 1 Applicatorful vaginally 3 (three) times a week. (Patient taking differently: Place 1 Applicatorful vaginally daily as  needed (dryness).) 42.5 g 12   estradiol (ESTRACE) 1 MG tablet Take 1 tablet (1 mg total) by mouth daily. 90 tablet 1   lisinopril (ZESTRIL) 5 MG tablet Take 1 tablet (5 mg total) by mouth daily. 90 tablet 1   pantoprazole (PROTONIX) 40 MG tablet Take 1 tablet (40 mg total) by mouth 2 (two) times daily before a meal. (Patient taking differently: Take 40 mg by mouth daily.) 180 tablet 0   Pramox-PE-Glycerin-Petrolatum (HEMORRHOIDAL) 1-0.25-14.4-15 % CREA Place 1 application rectally daily as needed (Hemorrhoid).     progesterone (PROMETRIUM) 200 MG capsule Take 2 capsules by mouth once daily (Patient taking differently: Take 400 mg by mouth at bedtime.) 180 capsule 0   ondansetron (ZOFRAN ODT) 4 MG disintegrating tablet Take 1 tablet (4 mg total) by mouth every 8 (eight) hours as needed for nausea or vomiting. (Patient not taking: Reported on 09/20/2020) 20 tablet 0    No results found for this or any previous visit (from the past 48 hour(s)). No results found.  Review of Systems  Constitutional: Negative.   HENT: Negative.    Eyes: Negative.   Respiratory: Negative.    Cardiovascular: Negative.   Gastrointestinal:  Positive for abdominal pain.  Endocrine: Negative.   Genitourinary: Negative.   Musculoskeletal: Negative.   Skin: Negative.   Allergic/Immunologic: Negative.   Neurological: Negative.   Hematological: Negative.   Psychiatric/Behavioral: Negative.     Blood pressure 136/71, pulse 68, temperature 98 F (36.7 C), temperature source Oral, resp. rate 15, height '5\' 3"'$  (1.6 m), last menstrual period 09/24/2017, SpO2 97 %. Physical Exam  GENERAL: The patient is AO x3, in no acute distress. Obese.  HEENT: Head is normocephalic and atraumatic. EOMI are intact. Mouth is well hydrated and without lesions. NECK: Supple. No masses LUNGS: Clear to auscultation. No presence of rhonchi/wheezing/rales. Adequate chest expansion HEART: RRR, normal s1 and s2. ABDOMEN: tender in epigastrium,  no guarding, no peritoneal signs, and nondistended. BS +. No masses. EXTREMITIES: Without any cyanosis, clubbing, rash, lesions or edema. NEUROLOGIC: AOx3, no focal motor deficit. SKIN: no jaundice, no rashes  Assessment/Plan 50 y/o F with PMH high grade serous ovarian cancer clinical stage IC s/p LSO, who presents for follow up of abdominal pain.  We will proceed with EGD with gastric biopsies.  Harvel Quale, MD 09/24/2020, 12:48 PM

## 2020-09-24 NOTE — Discharge Instructions (Addendum)
You are being discharged to home.  Resume your previous diet.  We are waiting for your pathology results.  Decrease pantoprazole to 40 mg every day.

## 2020-09-24 NOTE — Transfer of Care (Signed)
Immediate Anesthesia Transfer of Care Note  Patient: Kristy Franklin  Procedure(s) Performed: ESOPHAGOGASTRODUODENOSCOPY (EGD) WITH PROPOFOL BIOPSY  Patient Location: Endoscopy Unit  Anesthesia Type:General  Level of Consciousness: awake and alert   Airway & Oxygen Therapy: Patient Spontanous Breathing  Post-op Assessment: Report given to RN and Post -op Vital signs reviewed and stable  Post vital signs: Reviewed  Last Vitals:  Vitals Value Taken Time  BP    Temp    Pulse 62 09/24/20  1311  Resp    SpO2 98  09/24/20  1311    Last Pain:  Vitals:   09/24/20 1256  TempSrc:   PainSc: 2          Complications: No notable events documented.

## 2020-09-24 NOTE — Op Note (Addendum)
[facility] Patient Name: Kristy Franklin Procedure Date: 09/24/2020 12:37 PM MRN: [id number] Date of Birth: 01/15/1971 Attending MD: Maylon Peppers ,  CSN: [id number] Age: 50 Admit Type: Outpatient Procedure:                Upper GI endoscopy Indications:              Epigastric abdominal pain Providers:                Maylon Peppers, Caprice Kluver, Randa Spike,                            Technician Referring MD:              Medicines:                Monitored Anesthesia Care Complications:            No immediate complications. Estimated Blood Loss:     Estimated blood loss: none. Procedure:                Pre-Anesthesia Assessment:                           - Prior to the procedure, a History and Physical                            was performed, and patient medications, allergies                            and sensitivities were reviewed. The patient's                            tolerance of previous anesthesia was reviewed.                           - The risks and benefits of the procedure and the                            sedation options and risks were discussed with the                            patient. All questions were answered and informed                            consent was obtained.                           - ASA Grade Assessment: II - A patient with mild                            systemic disease.                           After obtaining informed consent, the endoscope was                            passed under direct vision. Throughout the  procedure, the patient's blood pressure, pulse, and                            oxygen saturations were monitored continuously. The                            [id number] scope was introduced through the                            mouth, and advanced to the second part of duodenum.                            The upper GI endoscopy was accomplished without                             difficulty. The patient tolerated the procedure                            well. Scope In: 1:00:00 PM Scope Out: 1:07:43 PM Total Procedure Duration: 0 hours 7 minutes 43 seconds  Findings:      The examined esophagus was normal.      The entire examined stomach was normal. Imaging was performed using       white light and narrow band imaging to visualize the mucosa. Biopsies       were taken with a cold forceps for histology. Biopsies were taken with a       cold forceps for Helicobacter pylori testing. No ulcerations were       observed upon careful inspection.      The examined duodenum was normal. Biopsies were taken with a cold       forceps for histology. No ulcerations were observed upon careful       inspection. Impression:               - Normal esophagus.                           - Normal stomach. Biopsied.                           - Normal examined duodenum. Biopsied. Moderate Sedation:      Per Anesthesia Care Recommendation:           - Discharge patient to home (ambulatory).                           - Resume previous diet.                           - Await pathology results.                           - Decrease pantoprazole to 40 mg every day. Procedure Code(s):        --- Professional ---                           [phone], Esophagogastroduodenoscopy, flexible,  transoral; with biopsy, single or multiple Diagnosis Code(s):        --- Professional ---                           R10.13, Epigastric pain CPT copyright 2019 [employer]. All rights reserved. The codes documented in this report are preliminary and upon coder review may  be revised to meet current compliance requirements. Maylon Peppers, MD Maylon Peppers,  09/24/2020 1:12:33 PM This report has been signed electronically. Number of Addenda: 0

## 2020-09-24 NOTE — Anesthesia Preprocedure Evaluation (Signed)
Anesthesia Evaluation  Patient identified by MRN, date of birth, ID band Patient awake    Reviewed: Allergy & Precautions, H&P , NPO status , Patient's Chart, lab work & pertinent test results, reviewed documented beta blocker date and time   Airway Mallampati: II  TM Distance: >3 FB Neck ROM: full    Dental no notable dental hx.    Pulmonary neg pulmonary ROS,    Pulmonary exam normal breath sounds clear to auscultation       Cardiovascular Exercise Tolerance: Good hypertension, negative cardio ROS   Rhythm:regular Rate:Normal     Neuro/Psych  Headaches,  Neuromuscular disease negative psych ROS   GI/Hepatic Neg liver ROS, GERD  Medicated,  Endo/Other  negative endocrine ROS  Renal/GU negative Renal ROS  negative genitourinary   Musculoskeletal   Abdominal   Peds  Hematology negative hematology ROS (+)   Anesthesia Other Findings   Reproductive/Obstetrics negative OB ROS                             Anesthesia Physical Anesthesia Plan  ASA: 2  Anesthesia Plan: General   Post-op Pain Management:    Induction:   PONV Risk Score and Plan: Propofol infusion  Airway Management Planned:   Additional Equipment:   Intra-op Plan:   Post-operative Plan:   Informed Consent: I have reviewed the patients History and Physical, chart, labs and discussed the procedure including the risks, benefits and alternatives for the proposed anesthesia with the patient or authorized representative who has indicated his/her understanding and acceptance.     Dental Advisory Given  Plan Discussed with: CRNA  Anesthesia Plan Comments:         Anesthesia Quick Evaluation

## 2020-09-24 NOTE — OR Nursing (Signed)
Patient states she receives heparin flush after infusions but is not aware of the dosage.

## 2020-09-24 NOTE — Anesthesia Postprocedure Evaluation (Signed)
Anesthesia Post Note  Patient: Kristy Franklin  Procedure(s) Performed: ESOPHAGOGASTRODUODENOSCOPY (EGD) WITH PROPOFOL BIOPSY  Patient location during evaluation: Phase II Anesthesia Type: General Level of consciousness: awake Pain management: pain level controlled Vital Signs Assessment: post-procedure vital signs reviewed and stable Respiratory status: spontaneous breathing and respiratory function stable Cardiovascular status: blood pressure returned to baseline and stable Postop Assessment: no headache and no apparent nausea or vomiting Anesthetic complications: no Comments: Late entry   No notable events documented.   Last Vitals:  Vitals:   09/24/20 1311 09/24/20 1316  BP: (!) 99/46 111/63  Pulse: 64   Resp: 19   Temp: 36.7 C   SpO2: 97%     Last Pain:  Vitals:   09/24/20 1256  TempSrc:   PainSc: [place]

## 2020-09-28 LAB — SURGICAL PATHOLOGY

## 2020-09-29 ENCOUNTER — Encounter (HOSPITAL_COMMUNITY): Payer: Self-pay | Admitting: Gastroenterology

## 2020-10-11 ENCOUNTER — Other Ambulatory Visit (INDEPENDENT_AMBULATORY_CARE_PROVIDER_SITE_OTHER): Payer: Self-pay

## 2020-10-11 NOTE — Telephone Encounter (Signed)
She will continue with estradiol and progesterone at current doses and we will check levels to see if we need to adjust any of the doses. We will check thyroid function and also obtain an ultrasound of the thyroid for further evaluation of the possible goiter. Further recommendations will depend on all these results and I will see her in 3 months time for follow-up. This was in Feb-2022.note

## 2020-10-13 MED ORDER — PROGESTERONE 200 MG PO CAPS: 400.0000 mg | ORAL_CAPSULE | Freq: Every day | ORAL | 0 refills | Status: DC

## 2020-10-25 ENCOUNTER — Ambulatory Visit (INDEPENDENT_AMBULATORY_CARE_PROVIDER_SITE_OTHER): Payer: 59 | Admitting: Internal Medicine

## 2020-12-13 ENCOUNTER — Telehealth: Payer: Self-pay | Admitting: *Deleted

## 2020-12-13 NOTE — Telephone Encounter (Signed)
Patient requested path report from 2019, copy left a front desk for patient

## 2020-12-13 NOTE — Telephone Encounter (Signed)
Patient called and scheduled a port flush appt

## 2020-12-15 ENCOUNTER — Inpatient Hospital Stay: Payer: 59 | Attending: Obstetrics & Gynecology

## 2020-12-15 ENCOUNTER — Other Ambulatory Visit: Payer: Self-pay

## 2020-12-15 DIAGNOSIS — Z452 Encounter for adjustment and management of vascular access device: Secondary | ICD-10-CM | POA: Diagnosis not present

## 2020-12-15 DIAGNOSIS — C562 Malignant neoplasm of left ovary: Secondary | ICD-10-CM | POA: Insufficient documentation

## 2020-12-15 MED ORDER — HEPARIN SOD (PORK) LOCK FLUSH 100 UNIT/ML IV SOLN
500.0000 [IU] | Freq: Once | INTRAVENOUS | Status: AC
  Administered 2020-12-15: 500 [IU]

## 2020-12-15 MED ORDER — SODIUM CHLORIDE 0.9% FLUSH
10.0000 mL | Freq: Once | INTRAVENOUS | Status: AC
  Administered 2020-12-15: 10 mL

## 2021-01-17 ENCOUNTER — Other Ambulatory Visit (INDEPENDENT_AMBULATORY_CARE_PROVIDER_SITE_OTHER): Payer: Self-pay | Admitting: Nurse Practitioner

## 2021-01-27 ENCOUNTER — Other Ambulatory Visit: Payer: 59

## 2021-01-27 ENCOUNTER — Inpatient Hospital Stay: Payer: 59 | Attending: Obstetrics & Gynecology

## 2021-01-27 DIAGNOSIS — Z8543 Personal history of malignant neoplasm of ovary: Secondary | ICD-10-CM | POA: Insufficient documentation

## 2021-01-27 DIAGNOSIS — Z8542 Personal history of malignant neoplasm of other parts of uterus: Secondary | ICD-10-CM | POA: Insufficient documentation

## 2021-01-27 DIAGNOSIS — Z9221 Personal history of antineoplastic chemotherapy: Secondary | ICD-10-CM | POA: Insufficient documentation

## 2021-01-27 DIAGNOSIS — N760 Acute vaginitis: Secondary | ICD-10-CM | POA: Insufficient documentation

## 2021-01-27 DIAGNOSIS — Z452 Encounter for adjustment and management of vascular access device: Secondary | ICD-10-CM | POA: Insufficient documentation

## 2021-01-27 DIAGNOSIS — R109 Unspecified abdominal pain: Secondary | ICD-10-CM | POA: Insufficient documentation

## 2021-02-01 ENCOUNTER — Encounter: Payer: Self-pay | Admitting: Obstetrics & Gynecology

## 2021-02-02 ENCOUNTER — Other Ambulatory Visit: Payer: Self-pay

## 2021-02-02 ENCOUNTER — Inpatient Hospital Stay: Payer: 59

## 2021-02-02 ENCOUNTER — Telehealth: Payer: Self-pay

## 2021-02-02 ENCOUNTER — Inpatient Hospital Stay (HOSPITAL_BASED_OUTPATIENT_CLINIC_OR_DEPARTMENT_OTHER): Payer: 59 | Admitting: Obstetrics & Gynecology

## 2021-02-02 VITALS — BP 143/50 | HR 86 | Temp 97.7°F | Resp 16 | Ht 63.0 in | Wt 176.8 lb

## 2021-02-02 DIAGNOSIS — C562 Malignant neoplasm of left ovary: Secondary | ICD-10-CM

## 2021-02-02 DIAGNOSIS — N760 Acute vaginitis: Secondary | ICD-10-CM

## 2021-02-02 DIAGNOSIS — Z8543 Personal history of malignant neoplasm of ovary: Secondary | ICD-10-CM | POA: Diagnosis present

## 2021-02-02 DIAGNOSIS — Z8542 Personal history of malignant neoplasm of other parts of uterus: Secondary | ICD-10-CM

## 2021-02-02 DIAGNOSIS — R109 Unspecified abdominal pain: Secondary | ICD-10-CM | POA: Diagnosis not present

## 2021-02-02 DIAGNOSIS — Z452 Encounter for adjustment and management of vascular access device: Secondary | ICD-10-CM | POA: Diagnosis not present

## 2021-02-02 DIAGNOSIS — Z9221 Personal history of antineoplastic chemotherapy: Secondary | ICD-10-CM | POA: Diagnosis not present

## 2021-02-02 DIAGNOSIS — C541 Malignant neoplasm of endometrium: Secondary | ICD-10-CM

## 2021-02-02 LAB — WET PREP, GENITAL
Clue Cells Wet Prep HPF POC: NONE SEEN
Sperm: NONE SEEN
Trich, Wet Prep: NONE SEEN
WBC, Wet Prep HPF POC: >=10 — AB (ref <10)
Yeast Wet Prep HPF POC: NONE SEEN

## 2021-02-02 NOTE — Patient Instructions (Signed)
Return in 6 months

## 2021-02-02 NOTE — Assessment & Plan Note (Signed)
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 BRCA negative.  S/p completion of chemotherapy with 6 cycles adjuvant carboplatin and paclitaxel in December, 2019.   We will see her back at 6 monthly intervals with CA 125 until December, 2024. 

## 2021-02-02 NOTE — Telephone Encounter (Signed)
Spoke with Ms. Poirier this afternoon and reviewed her wet prep results. Per Melissa Cross,NP wet prep is negative. Patient verbalized understanding. Instructed to call with any questions or concerns.

## 2021-02-02 NOTE — Progress Notes (Signed)
Follow Up Note: Gyn-Onc  Kristy Franklin 50 y.o. female  CC: She presents for a f/u visit   HPI: The oncology history was reviewed.  Interval History: She denies vaginal bleeding, cough, lethargy, abdominal distention, weight loss or change in her bowel habits. New onset earlier today left groin sharp pain w/ bilateral LE discomfort.  She also c/o Itching x several days.  She had a similar self-limited episode 1 mth ago.  No abnormal discharge, obvious exposures.  Review of Systems  Review of Systems  Constitutional:  Negative for malaise/fatigue and weight loss.  Respiratory:  Negative for shortness of breath and wheezing.   Cardiovascular:  Negative for chest pain and leg swelling.  Gastrointestinal:  Positive for abdominal pain; negative blood in stool, constipation, nausea and vomiting.  Genitourinary:  Negative for dysuria, frequency, hematuria and urgency. Positive for itching Musculoskeletal:  Negative for joint pain and myalgias.  Neurological:  Negative for weakness.  Psychiatric/Behavioral:  Negative for depression. The patient does not have insomnia.    Current medications, allergy, social history, past surgical history, past medical history, family history were all reviewed.  Vitals:  Blood pressure (!) 143/50, pulse 86, temperature 97.7 F (36.5 C), temperature source Tympanic, resp. rate 16, height 5' 3" (1.6 m), weight 176 lb 12.8 oz (80.2 kg), last menstrual period 09/24/2017, SpO2 100 %.  Physical Exam:  Physical Exam Exam conducted with a chaperone present.  Constitutional:      General: She is not in acute distress. Cardiovascular:     Rate and Rhythm: Normal rate and regular rhythm.  Pulmonary:     Effort: Pulmonary effort is normal.     Breath sounds: Normal breath sounds. No wheezing or rhonchi.  Abdominal:     Palpations: Abdomen is soft. Mild RLQ tenderness    Tenderness: There is no abdominal tenderness. There is no right CVA tenderness or left CVA  tenderness.     Hernia: No hernia is present.  Genitourinary:    General: Normal vulva.     Urethra: No urethral lesion.     Vagina: No lesions. No bleeding.  Thin, white discharge Musculoskeletal:     Cervical back: Neck supple.     Right lower leg: No edema.     Left lower leg: No edema.  Lymphadenopathy:     Upper Body:     Right upper body: No supraclavicular adenopathy.     Left upper body: No supraclavicular adenopathy.     Lower Body: No right inguinal adenopathy. No left inguinal adenopathy.  Skin:    Findings: No rash.  Neurological:     Mental Status: She is oriented to person, place, and time.   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 BRCA negative.  S/p completion of chemotherapy with 6 cycles adjuvant carboplatin and paclitaxel in December, 2019. Doubt clinically significant abdominal discomfort, unremarkable exam.  Normal tumor markers Unclear etiology of vulva pruritus   >We will see her back at 6 monthly intervals with CA 125 until December, 2024. >Review the wet prep findings; optimize hygiene practices >Keep a symptom diary    Lahoma Crocker, MD

## 2021-02-03 ENCOUNTER — Encounter: Payer: Self-pay | Admitting: Obstetrics & Gynecology

## 2021-02-03 ENCOUNTER — Encounter: Payer: Self-pay | Admitting: Hematology and Oncology

## 2021-02-03 ENCOUNTER — Telehealth: Payer: Self-pay

## 2021-02-03 LAB — CA 125: Cancer Antigen (CA) 125: 6.4 U/mL (ref 0.0–38.1)

## 2021-02-03 NOTE — Assessment & Plan Note (Addendum)
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 BRCA negative.  S/p completion of chemotherapy with 6 cycles adjuvant carboplatin and paclitaxel in December, 2019. Doubt clinically significant abdominal discomfort, unremarkable exam.  Normal tumor markers Unclear etiology of vulva pruritus   >We will see her back at 6 monthly intervals with CA 125 until December, 2024. >Review the wet prep findings; optimize hygiene practices >Keep a symptom diary

## 2021-02-03 NOTE — Telephone Encounter (Signed)
Spoke with Kristy Franklin this morning regarding her CA 125 lab results. Informed pt that lab results were stable and WNL. Patient verbalized understanding. Pt requesting port flush appt. Appt made and pt in agreement to date and time.

## 2021-02-07 ENCOUNTER — Other Ambulatory Visit: Payer: Self-pay

## 2021-02-07 ENCOUNTER — Inpatient Hospital Stay: Payer: 59

## 2021-02-07 DIAGNOSIS — Z8542 Personal history of malignant neoplasm of other parts of uterus: Secondary | ICD-10-CM | POA: Diagnosis not present

## 2021-02-07 DIAGNOSIS — C562 Malignant neoplasm of left ovary: Secondary | ICD-10-CM

## 2021-02-07 MED ORDER — SODIUM CHLORIDE 0.9% FLUSH
10.0000 mL | Freq: Once | INTRAVENOUS | Status: AC
  Administered 2021-02-07: 10 mL

## 2021-02-07 MED ORDER — HEPARIN SOD (PORK) LOCK FLUSH 100 UNIT/ML IV SOLN
500.0000 [IU] | Freq: Once | INTRAVENOUS | Status: AC
  Administered 2021-02-07: 500 [IU]

## 2021-02-24 ENCOUNTER — Encounter: Payer: Self-pay | Admitting: Hematology and Oncology

## 2021-03-04 DIAGNOSIS — Z20828 Contact with and (suspected) exposure to other viral communicable diseases: Secondary | ICD-10-CM | POA: Diagnosis not present

## 2021-03-04 DIAGNOSIS — J019 Acute sinusitis, unspecified: Secondary | ICD-10-CM | POA: Diagnosis not present

## 2021-03-04 DIAGNOSIS — Z6832 Body mass index (BMI) 32.0-32.9, adult: Secondary | ICD-10-CM | POA: Diagnosis not present

## 2021-03-23 ENCOUNTER — Inpatient Hospital Stay: Payer: Self-pay | Attending: Obstetrics & Gynecology

## 2021-03-23 ENCOUNTER — Other Ambulatory Visit: Payer: Self-pay

## 2021-03-23 DIAGNOSIS — C562 Malignant neoplasm of left ovary: Secondary | ICD-10-CM

## 2021-03-23 DIAGNOSIS — Z8543 Personal history of malignant neoplasm of ovary: Secondary | ICD-10-CM | POA: Insufficient documentation

## 2021-03-23 DIAGNOSIS — Z8542 Personal history of malignant neoplasm of other parts of uterus: Secondary | ICD-10-CM | POA: Insufficient documentation

## 2021-03-23 DIAGNOSIS — Z452 Encounter for adjustment and management of vascular access device: Secondary | ICD-10-CM | POA: Insufficient documentation

## 2021-03-23 MED ORDER — HEPARIN SOD (PORK) LOCK FLUSH 100 UNIT/ML IV SOLN
500.0000 [IU] | Freq: Once | INTRAVENOUS | Status: AC
  Administered 2021-03-23: 14:00:00 500 [IU]

## 2021-03-23 MED ORDER — SODIUM CHLORIDE 0.9% FLUSH
10.0000 mL | Freq: Once | INTRAVENOUS | Status: AC
  Administered 2021-03-23: 14:00:00 10 mL

## 2021-04-18 ENCOUNTER — Other Ambulatory Visit (INDEPENDENT_AMBULATORY_CARE_PROVIDER_SITE_OTHER): Payer: Self-pay | Admitting: Gastroenterology

## 2021-04-18 NOTE — Telephone Encounter (Signed)
Based on most recent esophagogastroduodenospy, she should continue pantoprazole 40 mg every day.

## 2021-04-18 NOTE — Telephone Encounter (Signed)
Per 07/07/2020 note Once she finishes famotidine, will need to increase pantoprazole to 40 mg twice a day

## 2021-04-28 ENCOUNTER — Ambulatory Visit: Payer: Self-pay

## 2021-04-28 ENCOUNTER — Telehealth: Payer: Self-pay | Admitting: *Deleted

## 2021-04-28 DIAGNOSIS — R3915 Urgency of urination: Secondary | ICD-10-CM

## 2021-04-28 DIAGNOSIS — R300 Dysuria: Secondary | ICD-10-CM

## 2021-04-28 DIAGNOSIS — C562 Malignant neoplasm of left ovary: Secondary | ICD-10-CM

## 2021-04-28 NOTE — Telephone Encounter (Signed)
Patient called and scheduled a port flush appt for Tuesday 3/7 at 2 pm. ? ?Patient stated "I wanted to let someone know that I'm having issues with my urine. I have some leaking when I cough or sneeze; is this normal? I also have an increase in urine frequency. But I have no burning, itching, problem emptying my bladder and no blood. It started about three weeks ago and has stayed the same. What do I need to do? Is this normal, do I need a scan?"  ? ?Explained that the message would be given to Dr Berline Lopes and Lenna Sciara APP; explained that the office will call her back tomorrow. Patient is willing to give urine sample if needed Tuesday at her port flush appt.  ?

## 2021-04-28 NOTE — Telephone Encounter (Signed)
Returning call to Ms. Gregory. Patient reports urine leakage is a new symptom for her. It started in the last 2-3 weeks. She has urgency but when tries to void the urine will not come out. She has urine leakage when she coughs and sneezes. She states " I was at church and started leaking urine, it was so embarrassing. I had to start wearing pads for this." She states her symptoms are staying the same and are not worsening or improving. She has noticed she feels more tired lately, has an increase in headaches and her urine is a strong yellow color upon awakening. She reports drinking 4, 20 ounce bottles of water daily. RN inquired if she was on a new medications " I was given a new nerve pain medicine 2 months ago but I don't have it with me right now and can't tell you what it is. My meloxicam was also recently increased to a higher dose." Patient denies abdominal pain or bloating but endorses burning in her stomach " I have been out of my omeprazole for 3 days and my prescription is not ready yet." She reports her bowels are functioning normally and regularly. She has noticed an increase in her appetite. Denies vaginal bleeding or blood in her urine. Joylene John, NP notified. ?Lab appointment scheduled 05/03/21 at 1:30 pm to check her urine. BMP will be checked at her port flush on 05/03/21 at 2:00 pm.  ? ?Appointment scheduled with Dr. Delsa Sale on 05/05/21 at 3pm. ? ?Patient is in agreement of all appointment times and dates. Instructed to call with any needs.  ?

## 2021-05-03 ENCOUNTER — Inpatient Hospital Stay: Payer: 59

## 2021-05-03 ENCOUNTER — Encounter: Payer: Self-pay | Admitting: Hematology and Oncology

## 2021-05-03 ENCOUNTER — Inpatient Hospital Stay: Payer: 59 | Attending: Obstetrics & Gynecology

## 2021-05-03 ENCOUNTER — Other Ambulatory Visit: Payer: Self-pay

## 2021-05-03 DIAGNOSIS — R3915 Urgency of urination: Secondary | ICD-10-CM

## 2021-05-03 DIAGNOSIS — C562 Malignant neoplasm of left ovary: Secondary | ICD-10-CM

## 2021-05-03 DIAGNOSIS — Z9221 Personal history of antineoplastic chemotherapy: Secondary | ICD-10-CM | POA: Diagnosis not present

## 2021-05-03 DIAGNOSIS — R300 Dysuria: Secondary | ICD-10-CM

## 2021-05-03 DIAGNOSIS — Z8542 Personal history of malignant neoplasm of other parts of uterus: Secondary | ICD-10-CM | POA: Insufficient documentation

## 2021-05-03 DIAGNOSIS — Z8543 Personal history of malignant neoplasm of ovary: Secondary | ICD-10-CM | POA: Insufficient documentation

## 2021-05-03 LAB — URINALYSIS, COMPLETE (UACMP) WITH MICROSCOPIC
Bacteria, UA: NONE SEEN
Bilirubin Urine: NEGATIVE
Glucose, UA: NEGATIVE mg/dL
Hgb urine dipstick: NEGATIVE
Ketones, ur: NEGATIVE mg/dL
Leukocytes,Ua: NEGATIVE
Nitrite: NEGATIVE
Protein, ur: NEGATIVE mg/dL
Specific Gravity, Urine: 1.01 (ref 1.005–1.030)
pH: 6 (ref 5.0–8.0)

## 2021-05-03 LAB — BASIC METABOLIC PANEL - CANCER CENTER ONLY
Anion gap: 5 (ref 5–15)
BUN: 15 mg/dL (ref 6–20)
CO2: 28 mmol/L (ref 22–32)
Calcium: 9.2 mg/dL (ref 8.9–10.3)
Chloride: 106 mmol/L (ref 98–111)
Creatinine: 0.59 mg/dL (ref 0.44–1.00)
GFR, Estimated: >60 mL/min (ref >60)
Glucose, Bld: 125 mg/dL — ABNORMAL HIGH (ref 70–99)
Potassium: 3.8 mmol/L (ref 3.5–5.1)
Sodium: 139 mmol/L (ref 135–145)

## 2021-05-03 MED ORDER — SODIUM CHLORIDE 0.9% FLUSH
10.0000 mL | Freq: Once | INTRAVENOUS | Status: AC
  Administered 2021-05-03: 10 mL

## 2021-05-03 MED ORDER — HEPARIN SOD (PORK) LOCK FLUSH 100 UNIT/ML IV SOLN
500.0000 [IU] | Freq: Once | INTRAVENOUS | Status: AC
  Administered 2021-05-03: 500 [IU]

## 2021-05-04 LAB — URINE CULTURE

## 2021-05-04 LAB — CA 125: Cancer Antigen (CA) 125: 6.6 U/mL (ref 0.0–38.1)

## 2021-05-04 NOTE — Progress Notes (Signed)
Follow Up Note: Gyn-Onc ? ?Kristy Franklin 51 y.o. female ? ?CC: She presents for a f/u visit ? ? ?HPI: The oncology history was reviewed. ? ?Interval History: She complained of vulva itching at the last visit.  A wet prep in 12/22 showed wbcs.  She endorses stress incontinence, hesitancy that improves with increased fluid intake. A recent U/A and urine C&S was negative.  She denies vaginal bleeding, cough, lethargy, abdominal distention, weight loss or change in her bowel habits. ? ?Review of Systems  ?Review of Systems  ?Constitutional:  Negative for malaise/fatigue and weight loss.  ?Respiratory:  Negative for shortness of breath and wheezing.   ?Cardiovascular:  Negative for chest pain and leg swelling.  ?Gastrointestinal:  Positive for abdominal pain; negative blood in stool, constipation, nausea and vomiting.  ?Genitourinary:  Negative for dysuria, frequency, hematuria and urgency. Positive for hesitancy, incontinence ?Musculoskeletal:  Negative for joint pain and myalgias.  ?Neurological:  Negative for weakness.  ?Psychiatric/Behavioral:  Negative for depression. The patient does not have insomnia.   ? ?Current medications, allergy, social history, past surgical history, past medical history, family history were all reviewed. ? ?Vitals:  BP 117/71 (BP Location: Left Arm, Patient Position: Sitting)   Pulse 73   Temp 97.7 ?F (36.5 ?C) (Oral)   Resp 16   Ht 5' 2.6" (1.59 m)   Wt 174 lb 12.8 oz (79.3 kg)   LMP 09/24/2017   SpO2 100%   BMI 31.36 kg/m?   ? ?Physical Exam ?Exam conducted with a chaperone present.  ?Constitutional:   ?   General: She is not in acute distress. ?Cardiovascular:  ?   Rate and Rhythm: Normal rate and regular rhythm.  ?Pulmonary:  ?   Effort: Pulmonary effort is normal.  ?   Breath sounds: Normal breath sounds. No wheezing or rhonchi.  ?Abdominal:  ?   Palpations: Abdomen is soft. Mild RLQ tenderness ?   Tenderness: There is no abdominal tenderness. There is no right CVA  tenderness or left CVA tenderness.  ?   Hernia: No hernia is present.  ?Genitourinary: ?   General: Normal vulva.  ?   Urethra: No urethral lesion.  ?   Vagina: No lesions. No bleeding.  Thin, white discharge ?Musculoskeletal:  ?   Cervical back: Neck supple.  ?   Right lower leg: No edema.  ?   Left lower leg: No edema.  ?Lymphadenopathy:  ?   Upper Body:  ?   Right upper body: No supraclavicular adenopathy.  ?   Left upper body: No supraclavicular adenopathy.  ?   Lower Body: No right inguinal adenopathy. No left inguinal adenopathy.  ?Skin: ?   Findings: No rash.  ?Neurological:  ?   Mental Status: She is oriented to person, place, and time.  ? ?Assessment/Plan:  ? 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 ?BRCA negative.  ?S/p completion of chemotherapy with 6 cycles adjuvant carboplatin and paclitaxel in December, 2019. ?Unremarkable exam.  Normal tumor markers ? ?  ?>We will see her back at 6 monthly intervals with CA 125 until December, 2024. ?  ?I personally spent 25 minutes face-to-face and non-face-to-face in the care of this patient, which includes all pre, intra, and post visit time on the date of service.  ? ?Lahoma Crocker, MD ?

## 2021-05-05 ENCOUNTER — Encounter: Payer: Self-pay | Admitting: Obstetrics & Gynecology

## 2021-05-05 ENCOUNTER — Inpatient Hospital Stay (HOSPITAL_BASED_OUTPATIENT_CLINIC_OR_DEPARTMENT_OTHER): Payer: 59 | Admitting: Obstetrics & Gynecology

## 2021-05-05 ENCOUNTER — Other Ambulatory Visit: Payer: Self-pay

## 2021-05-05 VITALS — BP 117/71 | HR 73 | Temp 97.7°F | Resp 16 | Ht 62.6 in | Wt 174.8 lb

## 2021-05-05 DIAGNOSIS — Z9221 Personal history of antineoplastic chemotherapy: Secondary | ICD-10-CM | POA: Diagnosis not present

## 2021-05-05 DIAGNOSIS — Z8542 Personal history of malignant neoplasm of other parts of uterus: Secondary | ICD-10-CM

## 2021-05-05 DIAGNOSIS — C562 Malignant neoplasm of left ovary: Secondary | ICD-10-CM

## 2021-05-05 DIAGNOSIS — Z8543 Personal history of malignant neoplasm of ovary: Secondary | ICD-10-CM | POA: Diagnosis not present

## 2021-05-05 NOTE — Patient Instructions (Signed)
Return in 6 mos

## 2021-05-16 ENCOUNTER — Telehealth: Payer: Self-pay | Admitting: *Deleted

## 2021-05-16 NOTE — Telephone Encounter (Signed)
Spoke with Kristy Franklin this afternoon. Pt asked if she needs to take any medication for her urine culture results. She stated that she just feels pressure like discomfort like she needs to pee. When she does go to the bathroom sometimes nothing comes out or very little comes out. Her urgency to pee has increased as well as her frequency but that may be due to her drinking more water. She denies any pain, burning, fevers or chills. Informed her we would let the providers know and call her back. Pt verbalized understanding and did not voice any other concerns.  ?

## 2021-05-16 NOTE — Telephone Encounter (Signed)
Attempted to speak with pt this afternoon to let her know that per Lenna Sciara, NP to keep an eye on the symptom and we will follow back up with her on Thursday. But if she needs [place] soon she can call the office. Unable to reach pt. LVM for a return call.  ?

## 2021-05-16 NOTE — Telephone Encounter (Signed)
Spoke with pt on the phone today. Explained to pt per Melissa cross, np for her to monitor the symptoms and we will follow up with her on Thursday. If it gets worse to call the office. Pt verbalized understanding and did not voice any other concerns.  ?

## 2021-05-19 ENCOUNTER — Other Ambulatory Visit: Payer: Self-pay | Admitting: Gynecologic Oncology

## 2021-05-19 ENCOUNTER — Encounter: Payer: Self-pay | Admitting: Hematology and Oncology

## 2021-05-19 DIAGNOSIS — C562 Malignant neoplasm of left ovary: Secondary | ICD-10-CM

## 2021-05-19 DIAGNOSIS — R140 Abdominal distension (gaseous): Secondary | ICD-10-CM

## 2021-05-19 DIAGNOSIS — R102 Pelvic and perineal pain: Secondary | ICD-10-CM

## 2021-05-19 DIAGNOSIS — M25559 Pain in unspecified hip: Secondary | ICD-10-CM

## 2021-05-19 NOTE — Progress Notes (Signed)
Given persistent abdominal/pelvic symptoms, plan for CT scan to evaluate for signs of recurrence. ?

## 2021-05-19 NOTE — Telephone Encounter (Signed)
Reviewed symptoms with Joylene John, NP and she has suggested a CT scan. Called patient to let her know that CT scan is scheduled for Thursday March 30th @ 3 pm at [facility]. Informed patient NPO after 11 am, drink one bottle of contrast at 1 pm, second bottle at 2 pm.  Patient will pick up contrast at [facility] before 3/30.  Patient verbalized understanding.  ?

## 2021-05-19 NOTE — Telephone Encounter (Signed)
Called patient to check on urinary symptoms from earlier in the week. Patient stated "the bottom of my belly is distended and a little bit of pain and pressure too. My pee is normal."  Pt denies any fever, chills, odor, and pain when urinating. Informed pt I would let the NP know and someone from our office will be calling her back today or tomorrow. Pt verbalized understanding.  ?

## 2021-05-20 ENCOUNTER — Telehealth: Payer: Self-pay | Admitting: *Deleted

## 2021-05-20 NOTE — Addendum Note (Signed)
Addended by: Joylene John D on: 05/20/2021 02:06 PM ? ? Modules accepted: Orders ? ?

## 2021-05-20 NOTE — Telephone Encounter (Signed)
Per patient request fax scan order and auth number to [place]. Patient notified  ?

## 2021-05-20 NOTE — Telephone Encounter (Signed)
Patient called and gave the [place] fax number of [phone]. Patient also gave the insurance auth # of [id number] ?

## 2021-05-26 ENCOUNTER — Ambulatory Visit (HOSPITAL_COMMUNITY): Payer: 59

## 2021-05-27 ENCOUNTER — Telehealth: Payer: Self-pay | Admitting: *Deleted

## 2021-05-27 DIAGNOSIS — C562 Malignant neoplasm of left ovary: Secondary | ICD-10-CM | POA: Diagnosis not present

## 2021-05-27 NOTE — Telephone Encounter (Signed)
Unable to reach pt to review results of CT scan. LVM requesting a call back.  ?

## 2021-05-30 ENCOUNTER — Encounter: Payer: Self-pay | Admitting: Hematology and Oncology

## 2021-05-30 NOTE — Telephone Encounter (Signed)
Spoke with pt on the phone this morning regarding her CT scan results. Per Joylene John, NP CT scan shows no acute findings in the abdomen and pelvis to explain her symptoms.  On the scan it shows that she is moderately constipated so she needs to get on an aggressive bowel regimen.  The scan notes diverticulosis, which are outpouching of the colon that can happen with age and constipation without any evidence of diverticulitis (inflammation of these areas).  The scan shows a small hiatal hernia on the scan which is just where the upper part of the stomach can slightly move in and out of the opening between the diaphragm.  They state this is small and it does not need intervention at this time. Pt can take miralx for a couple of days. Pt verbalized understanding and did not voice any other concerns at this time.  ?

## 2021-07-12 ENCOUNTER — Other Ambulatory Visit: Payer: Self-pay | Admitting: Hematology and Oncology

## 2021-07-12 DIAGNOSIS — C562 Malignant neoplasm of left ovary: Secondary | ICD-10-CM

## 2021-07-15 DIAGNOSIS — Z1231 Encounter for screening mammogram for malignant neoplasm of breast: Secondary | ICD-10-CM | POA: Diagnosis not present

## 2021-07-21 ENCOUNTER — Other Ambulatory Visit: Payer: Self-pay | Admitting: Radiology

## 2021-07-22 ENCOUNTER — Encounter (HOSPITAL_COMMUNITY): Payer: Self-pay

## 2021-07-22 ENCOUNTER — Ambulatory Visit (HOSPITAL_COMMUNITY)
Admission: RE | Admit: 2021-07-22 | Discharge: 2021-07-22 | Disposition: A | Discharge status: Home / Self Care | Payer: 59 | Source: Ambulatory Visit | Attending: Hematology and Oncology | Admitting: Hematology and Oncology

## 2021-07-22 ENCOUNTER — Other Ambulatory Visit: Payer: Self-pay

## 2021-07-22 DIAGNOSIS — C562 Malignant neoplasm of left ovary: Secondary | ICD-10-CM | POA: Insufficient documentation

## 2021-07-22 DIAGNOSIS — Z452 Encounter for adjustment and management of vascular access device: Secondary | ICD-10-CM | POA: Diagnosis not present

## 2021-07-22 DIAGNOSIS — C569 Malignant neoplasm of unspecified ovary: Secondary | ICD-10-CM | POA: Diagnosis not present

## 2021-07-22 HISTORY — PX: IR REMOVAL TUN ACCESS W/ PORT W/O FL MOD SED: IMG2290

## 2021-07-22 MED ORDER — SODIUM CHLORIDE 0.9 % IV SOLN: INTRAVENOUS | Status: DC

## 2021-07-22 MED ORDER — MIDAZOLAM HCL 2 MG/2ML IJ SOLN
INTRAMUSCULAR | Status: AC
  Filled 2021-07-22: qty 2

## 2021-07-22 MED ORDER — MIDAZOLAM HCL 2 MG/2ML IJ SOLN
INTRAMUSCULAR | Status: AC | PRN
Start: 2021-07-22 — End: 2021-07-22
  Administered 2021-07-22: 1 mg via INTRAVENOUS

## 2021-07-22 MED ORDER — FENTANYL CITRATE (PF) 100 MCG/2ML IJ SOLN
INTRAMUSCULAR | Status: AC
  Filled 2021-07-22: qty 2

## 2021-07-22 MED ORDER — LIDOCAINE-EPINEPHRINE 1 %-1:100000 IJ SOLN
INTRAMUSCULAR | Status: AC | PRN
  Administered 2021-07-22: 10 mL via INTRADERMAL

## 2021-07-22 MED ORDER — LIDOCAINE-EPINEPHRINE 1 %-1:100000 IJ SOLN
INTRAMUSCULAR | Status: AC
  Filled 2021-07-22: qty 1

## 2021-07-22 NOTE — Procedures (Signed)
Interventional Radiology Procedure Note  Procedure: RT IJ POWER PORT  REMOVAL  Complications: None  Estimated Blood Loss:  MIN  Findings: FULL REPORT IN PACS    M. TREVOR Teofilo Lupinacci, MD    

## 2021-07-22 NOTE — Discharge Instructions (Addendum)
Implanted Port Removal, Care After The following information offers guidance on how to care for yourself after your procedure. Your health care provider may also give you more specific instructions. If you have problems or questions, contact your health care provider.  Urgent needs - Interventional Radiology on call MD [phone]  Wound - May remove dressing and shower in 24 to 48 hours.  Keep site clean and dry.  Replace with bandaid as needed.  Do not submerge in tub or water until site healing well. If closed with glue, glue will flake off on its own.  What can I expect after the procedure? After the procedure, it is common to have: Soreness or pain near your incision. Some swelling or bruising near your incision. Follow these instructions at home: Medicines Take over-the-counter and prescription medicines only as told by your health care provider. If you were prescribed an antibiotic medicine, take it as told by your health care provider. Do not stop taking the antibiotic even if you start to feel better. Bathing Do not take baths, swim, or use a hot tub until your health care provider approves. Ask your health care provider if you can take showers. You may only be allowed to take sponge baths. Incision care Follow instructions from your health care provider about how to take care of your incision. Make sure you: Wash your hands with soap and water for at least 20 seconds before and after you change your bandage (dressing). If soap and water are not available, use hand sanitizer. Change your dressing as told by your health care provider. Keep your dressing dry. Leave stitches (sutures), skin glue, or adhesive strips in place. These skin closures may need to stay in place for 2 weeks or longer. If adhesive strip edges start to loosen and curl up, you may trim the loose edges. Do not remove adhesive strips completely unless your health care provider tells you to do that. Check your  incision area every day for signs of infection. Check for: More redness, swelling, or pain. More fluid or blood. Warmth. Pus or a bad smell. Activity Return to your normal activities as told by your health care provider. Ask your health care provider what activities are safe for you. You may have to avoid lifting. Ask your health care provider how much you can safely lift. Do not do activities that involve lifting your arms over your head. Driving If you were given a sedative during the procedure, it can affect you for several hours. Do not drive or operate machinery until your health care provider says that it is safe. If you did not receive a sedative, ask your health care provider when it is safe to drive. General instructions Do not use any products that contain nicotine or tobacco. These products include cigarettes, chewing tobacco, and vaping devices, such as e-cigarettes. These can delay healing after surgery. If you need help quitting, ask your health care provider. Keep all follow-up visits. This is important. Contact a health care provider if: You have a fever or chills. You have more redness, swelling, or pain around your incision. You have more fluid or blood coming from your incision. Your incision feels warm to the touch. You have pus or a bad smell coming from your incision. You have pain that is not relieved by your pain medicine. Get help right away if: You have chest pain. You have difficulty breathing. These symptoms may be an emergency. Get help right away. Call 911. Do not wait   to see if the symptoms will go away. Do not drive yourself to the hospital. Summary After the procedure, it is common to have pain, soreness, swelling, or bruising near your incision. If you were prescribed an antibiotic medicine, take it as told by your health care provider. Do not stop taking the antibiotic even if you start to feel better. If you were given a sedative during the  procedure, it can affect you for several hours. Do not drive or operate machinery until your health care provider says that it is safe. Return to your normal activities as told by your health care provider. Ask your health care provider what activities are safe for you. This information is not intended to replace advice given to you by your health care provider. Make sure you discuss any questions you have with your health care provider. Document Revised: 08/17/2020 Document Reviewed: 08/17/2020 Elsevier Patient Education  2023 Else[employer] Inc[employer]   Moderate Conscious Sedation, Adult, Care After This sheet gives you information about how to care for yourself after your procedure. Your health care provider may also give you more specific instructions. If you have problems or questions, contact your health care provider. What can I expect after the procedure? After the procedure, it is common to have: Sleepiness for several hours. Impaired judgment for several hours. Difficulty with balance. Vomiting if you eat too soon. Follow these instructions at home: For the time period you were told by your health care provider:   Rest. Do not participate in activities where you could fall or become injured. Do not drive or use machinery. Do not drink alcohol. Do not take sleeping pills or medicines that cause drowsiness. Do not make important decisions or sign legal documents. Do not take care of children on your own. Eating and drinking Follow the diet recommended by your health care provider. Drink enough fluid to keep your urine pale yellow. If you vomit: Drink water, juice, or soup when you can drink without vomiting. Make sure you have little or no nausea before eating solid foods. General instructions Take over-the-counter and prescription medicines only as told by your health care provider. Have a responsible adult stay with you for the time you are told. It is important to have someone help  care for you until you are awake and alert. Do not smoke. Keep all follow-up visits as told by your health care provider. This is important. Contact a health care provider if: You are still sleepy or having trouble with balance after 24 hours. You feel light-headed. You keep feeling nauseous or you keep vomiting. You develop a rash. You have a fever. You have redness or swelling around the IV site. Get help right away if: You have trouble breathing. You have new-onset confusion at home. Summary After the procedure, it is common to feel sleepy, have impaired judgment, or feel nauseous if you eat too soon. Rest after you get home. Know the things you should not do after the procedure. Follow the diet recommended by your health care provider and drink enough fluid to keep your urine pale yellow. Get help right away if you have trouble breathing or new-onset confusion at home. This information is not intended to replace advice given to you by your health care provider. Make sure you discuss any questions you have with your health care provider. Document Revised: 06/13/2019 Document Reviewed: 01/09/2019 Elsevier Patient Education  2023 Elsevier Inc.         

## 2021-07-22 NOTE — H&P (Signed)
Referring Physician(s): Heath Lark  Supervising Physician: Daryll Brod  Patient Status:  [facility]  Chief Complaint:  "I'm getting my port a cath out"  Subjective: Patient known to IR service from Port-A-Cath placement in 2019 and left pelvic drain placement in 2020.  She  has a history of ovarian/endometrial cancer and has completed treatment.  She is no longer using her Port-A-Cath and presents today for Port-A-Cath removal.  She denies fever, chest pain, dyspnea, cough, abdominal/back pain, nausea, vomiting or bleeding; she does have occasional headaches.  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   Hypertension    Neck pain on right side    pulsating   Numbness and tingling of right leg    Obesity    Obesity (BMI 30.0-34.9) 12/17/2018   Ovarian cancer, left (HCC)    PPD positive    Skin rash    Chest, forehead, left arm   Past Surgical History:  Procedure Laterality Date   BIOPSY  04/24/2019   Procedure: BIOPSY;  Surgeon: Rogene Houston, MD;  [facility]: [facility];  Service: Endoscopy;;  antrum   BIOPSY  09/24/2020   Procedure: BIOPSY;  Surgeon: Harvel Quale, MD;  Location: [facility];  Service: Gastroenterology;;   CESAREAN SECTION     x2   CHOLECYSTECTOMY     20 years ago   COLONOSCOPY N/A 09/12/2018   Procedure: COLONOSCOPY;  Surgeon: Rogene Houston, MD;  [facility]: [facility];  Service: Endoscopy;  Laterality: N/A;  200   ESOPHAGOGASTRODUODENOSCOPY N/A 04/24/2019   Procedure: ESOPHAGOGASTRODUODENOSCOPY (EGD);  Surgeon: Rogene Houston, MD;  [facility]: [facility];  Service:  Endoscopy;  Laterality: N/A;  125   ESOPHAGOGASTRODUODENOSCOPY (EGD) WITH PROPOFOL N/A 09/24/2020   Procedure: ESOPHAGOGASTRODUODENOSCOPY (EGD) WITH PROPOFOL;  Surgeon: Harvel Quale, MD;  [facility]: [facility];  Service: Gastroenterology;  Laterality: N/A;  1:00   IR IMAGING GUIDED PORT INSERTION  10/24/2017   OMENTECTOMY N/A 10/02/2017   Procedure: OMENTECTOMY;  Surgeon: Everitt Amber, MD;  Location: [facility];  Service: Gynecology;  Laterality: N/A;   OTHER SURGICAL HISTORY  07/2017   Left ovary removed   POLYPECTOMY  04/24/2019   Procedure: POLYPECTOMY;  Surgeon: Rogene Houston, MD;  Location: [facility];  Service: Endoscopy;;  gastric   ROBOTIC ASSISTED TOTAL HYSTERECTOMY WITH BILATERAL SALPINGO OOPHERECTOMY Right 10/02/2017   Procedure: XI ROBOTIC ASSISTED TOTAL HYSTERECTOMY WITH RIGHT SALPINGO OOPHORECTOMY;  Surgeon: Everitt Amber, MD;  Location: [facility];  Service: Gynecology;  Laterality: Right;   ROBOTIC PELVIC AND PARA-AORTIC LYMPH NODE DISSECTION Bilateral 10/02/2017   Procedure: XI ROBOTIC PELVIC LYMPH NODE DISSECTION;  Surgeon: Everitt Amber, MD;  Location: [facility];  Service: Gynecology;  Laterality: Bilateral;      Allergies: Patient has no active allergies.  Medications: Prior to Admission medications   Medication Sig Start Date End Date Taking? Authorizing Provider  baclofen (LIORESAL) 10 MG tablet Take 10 mg by mouth 3 (three) times daily as needed. 12/13/20  Yes [provider]  Cholecalciferol (VITAMIN  D-3) 125 MCG (5000 UT) TABS Take 10,000 Units by mouth daily.    Yes [provider]  clotrimazole-betamethasone (LOTRISONE) cream Apply 1 application topically 2 (two) times daily. Patient taking differently: Apply 1 application. topically daily. 07/20/20  Yes Ailene Ards, NP  DULoxetine (CYMBALTA) 20 MG capsule Take 40 mg by mouth every morning. 01/04/21  Yes [provider]  estradiol (ESTRACE) 0.1 MG/GM vaginal cream Place 1 Applicatorful  vaginally 3 (three) times a week. Patient taking differently: Place 1 Applicatorful vaginally daily as needed (dryness). 08/27/19  Yes Everitt Amber, MD  estradiol (ESTRACE) 1 MG tablet Take 1 tablet (1 mg total) by mouth daily. 04/22/20  Yes Gosrani, Nimish C, MD  lisinopril (ZESTRIL) 5 MG tablet Take 1 tablet (5 mg total) by mouth daily. 04/22/20  Yes Gosrani, Nimish C, MD  meloxicam (MOBIC) 15 MG tablet Take 15 mg by mouth daily. 12/13/20  Yes [provider]  pantoprazole (PROTONIX) 40 MG tablet Take 1 tablet by mouth once daily 04/18/21  Yes Montez Morita, Quillian Quince, MD  Pramox-PE-Glycerin-Petrolatum (HEMORRHOIDAL) 1-0.25-14.4-15 % CREA Place 1 application rectally daily as needed (Hemorrhoid).   Yes [provider]  progesterone (PROMETRIUM) 200 MG capsule Take 2 capsules (400 mg total) by mouth at bedtime. 10/13/20  Yes Ailene Ards, NP  amitriptyline (ELAVIL) 10 MG tablet Take 1 tablet (10 mg total) by mouth at bedtime. Patient not taking: Reported on 02/01/2021 07/20/20   Ailene Ards, NP  ondansetron (ZOFRAN ODT) 4 MG disintegrating tablet Take 1 tablet (4 mg total) by mouth every 8 (eight) hours as needed for nausea or vomiting. Patient not taking: Reported on 09/20/2020 06/22/20   Robinson, [place] N, PA-C     Vital Signs: pending LMP 09/24/2017   Physical Exam awake, alert.  Chest clear to auscultation bilaterally.  Clean, intact right chest wall Port-A-Cath.  Heart with regular rate and rhythm.  Abdomen soft, positive bowel sounds, nontender.  No lower extremity edema.  Imaging: No results found.  Labs:  CBC: No results for input(s): WBC, HGB, HCT, PLT in the last 8760 hours.  COAGS: No results for input(s): INR, APTT in the last 8760 hours.  BMP: Recent Labs    05/03/21 1349  NA 139  K 3.8  CL 106  CO2 28  GLUCOSE 125*  BUN 15  CALCIUM 9.2  CREATININE 0.59  GFRNONAA >60    LIVER FUNCTION TESTS: No results for input(s): BILITOT, AST, ALT, ALKPHOS,  PROT, ALBUMIN in the last 8760 hours.  Assessment and Plan: Patient known to IR service from Port-A-Cath placement in 2019 and left pelvic drain placement in 2020.  She  has a history of ovarian/endometrial cancer and has completed treatment.  She is no longer using her Port-A-Cath and presents today for Port-A-Cath removal.  Details/risks of procedure, including but not limited to, internal bleeding, infection, injury to adjacent structures discussed with patient with her understanding and consent.   Electronically Signed: D. Rowe Robert, PA-C 07/22/2021, 12:09 PM   I spent a total of 20 minutes at the the patient's bedside AND on the patient's hospital floor or unit, greater than 50% of which was counseling/coordinating care for Port-A-Cath removal

## 2021-09-03 ENCOUNTER — Other Ambulatory Visit (INDEPENDENT_AMBULATORY_CARE_PROVIDER_SITE_OTHER): Payer: Self-pay | Admitting: Nurse Practitioner

## 2021-09-03 DIAGNOSIS — R21 Rash and other nonspecific skin eruption: Secondary | ICD-10-CM

## 2021-10-10 ENCOUNTER — Telehealth: Payer: Self-pay

## 2021-10-10 NOTE — Telephone Encounter (Signed)
ENCOUNTER OPENED IN ERROR

## 2021-10-20 DIAGNOSIS — Z6832 Body mass index (BMI) 32.0-32.9, adult: Secondary | ICD-10-CM | POA: Diagnosis not present

## 2021-10-20 DIAGNOSIS — R21 Rash and other nonspecific skin eruption: Secondary | ICD-10-CM | POA: Diagnosis not present

## 2021-10-20 DIAGNOSIS — G5603 Carpal tunnel syndrome, bilateral upper limbs: Secondary | ICD-10-CM | POA: Diagnosis not present

## 2021-10-20 DIAGNOSIS — R030 Elevated blood-pressure reading, without diagnosis of hypertension: Secondary | ICD-10-CM | POA: Diagnosis not present

## 2021-11-01 ENCOUNTER — Telehealth: Payer: Self-pay

## 2021-11-01 ENCOUNTER — Encounter: Payer: Self-pay | Admitting: Obstetrics & Gynecology

## 2021-11-01 NOTE — Telephone Encounter (Signed)
Christine ID# [id number]. I LVM for pt to call back for meaningful use update for upcoming appointment on 11/02/21 with Dr. Delsa Sale

## 2021-11-01 NOTE — Telephone Encounter (Signed)
Meaningful Use done

## 2021-11-02 ENCOUNTER — Inpatient Hospital Stay: Payer: 59 | Attending: Obstetrics & Gynecology | Admitting: Obstetrics & Gynecology

## 2021-11-02 ENCOUNTER — Inpatient Hospital Stay: Payer: 59

## 2021-11-02 ENCOUNTER — Other Ambulatory Visit: Payer: Self-pay

## 2021-11-02 VITALS — BP 111/46 | HR 71 | Temp 98.6°F | Resp 16 | Ht 62.99 in | Wt 178.6 lb

## 2021-11-02 DIAGNOSIS — E8941 Symptomatic postprocedural ovarian failure: Secondary | ICD-10-CM | POA: Diagnosis not present

## 2021-11-02 DIAGNOSIS — R232 Flushing: Secondary | ICD-10-CM | POA: Diagnosis not present

## 2021-11-02 DIAGNOSIS — R110 Nausea: Secondary | ICD-10-CM | POA: Diagnosis not present

## 2021-11-02 DIAGNOSIS — G8929 Other chronic pain: Secondary | ICD-10-CM | POA: Diagnosis not present

## 2021-11-02 DIAGNOSIS — C562 Malignant neoplasm of left ovary: Secondary | ICD-10-CM

## 2021-11-02 DIAGNOSIS — R109 Unspecified abdominal pain: Secondary | ICD-10-CM | POA: Diagnosis not present

## 2021-11-02 DIAGNOSIS — Z79899 Other long term (current) drug therapy: Secondary | ICD-10-CM | POA: Insufficient documentation

## 2021-11-02 DIAGNOSIS — Z79890 Hormone replacement therapy (postmenopausal): Secondary | ICD-10-CM | POA: Insufficient documentation

## 2021-11-02 MED ORDER — ESTRADIOL 0.05 MG/24HR TD PTWK: 0.0500 mg | MEDICATED_PATCH | TRANSDERMAL | 3 refills | Status: DC

## 2021-11-02 NOTE — Progress Notes (Signed)
Follow Up Note: Gyn-Onc  Kristy Franklin 51 y.o. female  CC: She presents for a f/u visit   HPI: The oncology history was reviewed.  Interval History: Since the last visit she has had multiple somatic complaints--back pain(on a multidrug regimen for treatment of chronic back pain), H/A, nausea, light headed, rash, constipation.  She denies vaginal bleeding, cough, lethargy, abdominal distention, weight loss or change in her bowel habits.  CTAP 3/31 showed no acute findings.  Currently on combination HRT; c/o persistent hot flushes.  Review of Systems  Review of Systems  Constitutional:  Negative for malaise/fatigue and weight loss.  Respiratory:  Negative for shortness of breath and wheezing.   Cardiovascular:  Negative for chest pain and leg swelling.  Gastrointestinal:  Positive for abdominal pain; negative blood in stool, constipation, nausea and vomiting.  Genitourinary:  Negative for dysuria, frequency, hematuria and urgency. Positive for hesitancy, incontinence Musculoskeletal:  Negative for joint pain and myalgias.  Neurological:  Negative for weakness.  Psychiatric/Behavioral:  Negative for depression. The patient does not have insomnia.    Current medications, allergy, social history, past surgical history, past medical history, family history were all reviewed.  Vitals: BP (!) 111/46 (BP Location: Left Arm, Patient Position: Sitting)   Pulse 71   Temp 98.6 F (37 C) (Oral)   Resp 16   Ht 5' 2.99" (1.6 m)   Wt 178 lb 9.6 oz (81 kg)   LMP 09/23/2017 (Exact Date)   SpO2 99%   BMI 31.65 kg/m     Physical Exam Exam conducted with a chaperone present.  Constitutional:      General: She is not in acute distress. Cardiovascular:     Rate and Rhythm: Normal rate and regular rhythm.  Pulmonary:     Effort: Pulmonary effort is normal.     Breath sounds: Normal breath sounds. No wheezing or rhonchi.  Abdominal:     Palpations: Abdomen is soft. Mild RLQ tenderness     Tenderness: There is no abdominal tenderness. There is no right CVA tenderness or left CVA tenderness.     Hernia: No hernia is present.  Genitourinary:    General: Normal vulva.     Urethra: No urethral lesion.     Vagina: No lesions. No bleeding.  Thin, white discharge Musculoskeletal:     Cervical back: Neck supple.     Right lower leg: No edema.     Left lower leg: No edema.  Lymphadenopathy:     Upper Body:     Right upper body: No supraclavicular adenopathy.     Left upper body: No supraclavicular adenopathy.     Lower Body: No right inguinal adenopathy. No left inguinal adenopathy.  Skin:    Findings: No rash.  Neurological:     Mental Status: She is oriented to person, place, and time.   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 BRCA negative.  S/p completion of chemotherapy with 6 cycles adjuvant carboplatin and paclitaxel in December, 2019. Unremarkable exam.  Normal tumor markers Menopausal vasomotor sxs--persistent   >recommend discontinuing combination HRT; rx sent to the pharmacy for transdermal ERT; risks/benefits or ERT were reviewed.  May need to titrate dosing for better sx control.  She may be a candidate for changing to an SSRI w/efficacy in treating hot flushes >We will see her back at 6 monthly intervals with CA 125 until December, 2024.   I  personally spent 25 minutes face-to-face and non-face-to-face in the care of this patient, which includes all pre, intra, and post visit time on the date of service.   Lahoma Crocker, MD

## 2021-11-02 NOTE — Patient Instructions (Addendum)
Return in 6 mos As we discussed today. Stop the Progesterone and the Estradiol pill. A prescription will be sent to your pharmacy for a Estrogen patch.  Call back in January 2024 to get scheduled with Dr.Jackson-Moore in March

## 2021-11-03 ENCOUNTER — Encounter: Payer: Self-pay | Admitting: Obstetrics & Gynecology

## 2021-11-03 ENCOUNTER — Telehealth: Payer: Self-pay

## 2021-11-03 LAB — CA 125: Cancer Antigen (CA) 125: 7 U/mL (ref 0.0–38.1)

## 2021-11-03 NOTE — Telephone Encounter (Signed)
Called patient using [employer]. Relayed results of CA 125. Results 7.0 U/mL.  Stable/within normal limits. Patient voiced appreciation.

## 2021-12-09 DIAGNOSIS — Z8543 Personal history of malignant neoplasm of ovary: Secondary | ICD-10-CM | POA: Diagnosis not present

## 2021-12-09 DIAGNOSIS — I10 Essential (primary) hypertension: Secondary | ICD-10-CM | POA: Diagnosis not present

## 2021-12-09 DIAGNOSIS — M545 Low back pain, unspecified: Secondary | ICD-10-CM | POA: Diagnosis not present

## 2021-12-09 DIAGNOSIS — Z90710 Acquired absence of both cervix and uterus: Secondary | ICD-10-CM | POA: Diagnosis not present

## 2021-12-09 DIAGNOSIS — R69 Illness, unspecified: Secondary | ICD-10-CM | POA: Diagnosis not present

## 2021-12-09 DIAGNOSIS — M25542 Pain in joints of left hand: Secondary | ICD-10-CM | POA: Diagnosis not present

## 2021-12-09 DIAGNOSIS — G8929 Other chronic pain: Secondary | ICD-10-CM | POA: Diagnosis not present

## 2021-12-09 DIAGNOSIS — J029 Acute pharyngitis, unspecified: Secondary | ICD-10-CM | POA: Diagnosis not present

## 2021-12-09 DIAGNOSIS — M25541 Pain in joints of right hand: Secondary | ICD-10-CM | POA: Diagnosis not present

## 2021-12-09 DIAGNOSIS — M25561 Pain in right knee: Secondary | ICD-10-CM | POA: Diagnosis not present

## 2021-12-09 DIAGNOSIS — Z79890 Hormone replacement therapy (postmenopausal): Secondary | ICD-10-CM | POA: Diagnosis not present

## 2021-12-09 DIAGNOSIS — K219 Gastro-esophageal reflux disease without esophagitis: Secondary | ICD-10-CM | POA: Diagnosis not present

## 2022-02-24 DIAGNOSIS — R739 Hyperglycemia, unspecified: Secondary | ICD-10-CM | POA: Diagnosis not present

## 2022-02-24 DIAGNOSIS — M159 Polyosteoarthritis, unspecified: Secondary | ICD-10-CM | POA: Diagnosis not present

## 2022-02-24 DIAGNOSIS — R748 Abnormal levels of other serum enzymes: Secondary | ICD-10-CM | POA: Diagnosis not present

## 2022-02-24 DIAGNOSIS — M7711 Lateral epicondylitis, right elbow: Secondary | ICD-10-CM | POA: Diagnosis not present

## 2022-02-24 DIAGNOSIS — G5600 Carpal tunnel syndrome, unspecified upper limb: Secondary | ICD-10-CM | POA: Diagnosis not present

## 2022-02-24 DIAGNOSIS — M7712 Lateral epicondylitis, left elbow: Secondary | ICD-10-CM | POA: Diagnosis not present

## 2022-02-24 DIAGNOSIS — G8929 Other chronic pain: Secondary | ICD-10-CM | POA: Diagnosis not present

## 2022-02-24 DIAGNOSIS — R768 Other specified abnormal immunological findings in serum: Secondary | ICD-10-CM | POA: Diagnosis not present

## 2022-02-24 DIAGNOSIS — M545 Low back pain, unspecified: Secondary | ICD-10-CM | POA: Diagnosis not present

## 2022-02-24 DIAGNOSIS — T380X5A Adverse effect of glucocorticoids and synthetic analogues, initial encounter: Secondary | ICD-10-CM | POA: Diagnosis not present

## 2022-05-03 ENCOUNTER — Inpatient Hospital Stay: Payer: 59 | Attending: Obstetrics & Gynecology | Admitting: Obstetrics & Gynecology

## 2022-05-03 ENCOUNTER — Inpatient Hospital Stay: Payer: 59

## 2022-05-03 ENCOUNTER — Other Ambulatory Visit: Payer: Self-pay

## 2022-05-03 ENCOUNTER — Encounter: Payer: Self-pay | Admitting: Obstetrics & Gynecology

## 2022-05-03 VITALS — BP 130/69 | HR 73 | Temp 97.6°F | Resp 16 | Ht 62.0 in | Wt 179.0 lb

## 2022-05-03 DIAGNOSIS — Z8543 Personal history of malignant neoplasm of ovary: Secondary | ICD-10-CM | POA: Diagnosis not present

## 2022-05-03 DIAGNOSIS — Z8542 Personal history of malignant neoplasm of other parts of uterus: Secondary | ICD-10-CM | POA: Diagnosis not present

## 2022-05-03 DIAGNOSIS — Z79899 Other long term (current) drug therapy: Secondary | ICD-10-CM | POA: Insufficient documentation

## 2022-05-03 DIAGNOSIS — Z9221 Personal history of antineoplastic chemotherapy: Secondary | ICD-10-CM | POA: Insufficient documentation

## 2022-05-03 DIAGNOSIS — R109 Unspecified abdominal pain: Secondary | ICD-10-CM | POA: Diagnosis not present

## 2022-05-03 DIAGNOSIS — C562 Malignant neoplasm of left ovary: Secondary | ICD-10-CM

## 2022-05-03 NOTE — Progress Notes (Signed)
Follow Up Note: Gyn-Onc  Kristy Franklin 52 y.o. female  CC: She presents for a f/u visit   HPI: The oncology history was reviewed.  Interval History:   She denies vaginal bleeding, cough, lethargy, abdominal distention, weight loss or change in her bowel habits.   She reports recent evaluation for a possible rheumatologic d/o.  Testing was negative for SLE .    Review of Systems  Review of Systems  Constitutional:  Negative for malaise/fatigue and weight loss.  Respiratory:  Negative for shortness of breath and wheezing.   Cardiovascular:  Negative for chest pain and leg swelling.  Gastrointestinal:  Positive for abdominal pain; negative blood in stool, constipation, nausea and vomiting.  Genitourinary:  Negative for dysuria, frequency, hematuria and urgency. Positive for hesitancy, incontinence Musculoskeletal:  Negative for joint pain and myalgias.  Neurological:  Negative for weakness.  Psychiatric/Behavioral:  Negative for depression. The patient does not have insomnia.    Current medications, allergy, social history, past surgical history, past medical history, family history were all reviewed.  Vitals: BP 130/69 (BP Location: Left Arm, Patient Position: Sitting)   Pulse 73   Temp 97.6 F (36.4 C) (Oral)   Resp 16   Ht '5\' 2"'$  (1.575 m)   Wt 179 lb (81.2 kg)   LMP 09/23/2017 (Exact Date)   SpO2 100%   BMI 32.74 kg/m     Physical Exam Exam conducted with a chaperone present.  Constitutional:      General: She is not in acute distress. Cardiovascular:     Rate and Rhythm: Normal rate and regular rhythm.  Pulmonary:     Effort: Pulmonary effort is normal.     Breath sounds: Normal breath sounds. No wheezing or rhonchi.  Abdominal:     Palpations: Abdomen is soft. Mild RLQ tenderness    Tenderness: There is no abdominal tenderness. There is no right CVA tenderness or left CVA tenderness.     Hernia: No hernia is present.  Genitourinary:    General: Normal vulva.      Urethra: No urethral lesion.     Vagina: No lesions. No bleeding.  Thin, white discharge Musculoskeletal:     Cervical back: Neck supple.     Right lower leg: No edema.     Left lower leg: No edema.  Lymphadenopathy:     Upper Body:     Right upper body: No supraclavicular adenopathy.     Left upper body: No supraclavicular adenopathy.     Lower Body: No right inguinal adenopathy. No left inguinal adenopathy.  Skin:    Findings: No rash.  Neurological:     Mental Status: She is oriented to person, place, and time.   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 BRCA negative.  S/p completion of chemotherapy with 6 cycles adjuvant carboplatin and paclitaxel in December, 2019. Unremarkable exam.  Normal tumor markers Doubt rheumatologic paraneoplastic issue   >We will see her back at 6 monthly intervals with CA 125 until December, 2024.   I personally spent 25 minutes face-to-face and non-face-to-face in the care of this patient, which includes all pre, intra, and post visit time on the date of service.   Lahoma Crocker, MD

## 2022-05-03 NOTE — Assessment & Plan Note (Addendum)
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 BRCA negative.  S/p completion of chemotherapy with 6 cycles adjuvant carboplatin and paclitaxel in December, 2019. Unremarkable exam.  Normal tumor markers Menopausal vasomotor sxs--persistent   >recommend discontinuing combination HRT; rx sent to the pharmacy for transdermal ERT; risks/benefits or ERT were reviewed.  May need to titrate dosing for better sx control.  She may be a candidate for changing to an SSRI w/efficacy in treating hot flushes > return in 6 mos

## 2022-05-03 NOTE — Patient Instructions (Addendum)
Return in 6 months. Please call at the end of June for an appointment in September

## 2022-05-05 ENCOUNTER — Telehealth: Payer: Self-pay

## 2022-05-05 LAB — CA 125: Cancer Antigen (CA) 125: 7.3 U/mL (ref 0.0–38.1)

## 2022-05-05 NOTE — Telephone Encounter (Signed)
-----   Message from Dorothyann Gibbs, NP sent at 05/05/2022  7:45 AM EST ----- Can you please let her know her CA 125 is normal and stable with prev values? ----- Message ----- From: Interface, [facility] [place] Sent: 05/05/2022   3:36 AM EST To: Dorothyann Gibbs, NP

## 2022-05-05 NOTE — Telephone Encounter (Signed)
Pt aware of normal and stable results

## 2022-07-27 DIAGNOSIS — M25541 Pain in joints of right hand: Secondary | ICD-10-CM | POA: Diagnosis not present

## 2022-07-27 DIAGNOSIS — M545 Low back pain, unspecified: Secondary | ICD-10-CM | POA: Diagnosis not present

## 2022-07-27 DIAGNOSIS — I10 Essential (primary) hypertension: Secondary | ICD-10-CM | POA: Diagnosis not present

## 2022-07-27 DIAGNOSIS — M159 Polyosteoarthritis, unspecified: Secondary | ICD-10-CM | POA: Diagnosis not present

## 2022-07-27 DIAGNOSIS — R21 Rash and other nonspecific skin eruption: Secondary | ICD-10-CM | POA: Diagnosis not present

## 2022-07-27 DIAGNOSIS — G5603 Carpal tunnel syndrome, bilateral upper limbs: Secondary | ICD-10-CM | POA: Diagnosis not present

## 2022-08-04 ENCOUNTER — Ambulatory Visit: Payer: 59 | Admitting: Orthopedic Surgery

## 2022-08-11 ENCOUNTER — Ambulatory Visit: Payer: 59 | Admitting: Orthopedic Surgery

## 2022-08-11 ENCOUNTER — Encounter: Payer: Self-pay | Admitting: Orthopedic Surgery

## 2022-08-11 VITALS — BP 129/79 | HR 65

## 2022-08-11 DIAGNOSIS — G5601 Carpal tunnel syndrome, right upper limb: Secondary | ICD-10-CM

## 2022-08-11 DIAGNOSIS — M65331 Trigger finger, right middle finger: Secondary | ICD-10-CM | POA: Diagnosis not present

## 2022-08-11 NOTE — Patient Instructions (Signed)

## 2022-08-11 NOTE — Progress Notes (Signed)
New Patient Visit  Assessment: Kristy Franklin is a 52 y.o. female with the following: 1. Trigger finger, right middle finger 2. Carpal tunnel syndrome, right upper limb   Plan: Symptoms and physical exam most consistent with trigger finger.  We discussed etiology, and potential treatment options.  Surgery was discussed in detail, including the plan for procedure, and expected recovery.  After discussing these options, the patient would like to proceed with a steroid injection.  This was completed in clinic today.  Depending on the efficacy of the injection, we could consider another injection.  However, if the injection does not provide sustained relief, I would recommend surgery.  All of this was discussed with the patient, and they are amenable to this plan.   In addition, she has some shooting pains, numbness and tingling in the median nerve distribution on the right.  She is try medications, as well as bracing.  The brace has provided some relief of her symptoms.  She states that her symptoms have changed based on the weather as well.  She has had symptoms in the past in the left, but these are better with the change in temperature.  She also describes as some radiating pains into the arm.  She may have some radiculopathy.  Nonetheless, EMG results could determine if there is significant compression.  We would like to schedule an EMG, and then we can meet to discuss the findings.  She does express some concern about cost.  When she scheduled her appointment, she should be able to get more information regarding the costs.   Procedure note injection - Right Long finger A1 Pulley  Verbal consent was obtained to inject the Right Long finger A1 pulley Timeout was completed to confirm the site of injection.  The skin was prepped with alcohol and ethyl chloride was sprayed at the injection site.  A 21-gauge needle was used to inject 40 mg of Depo-Medrol and 1% lidocaine (1 cc) into the Right Long  finger using a direct anterior approach.  There were no complications. Patient tolerated the procedure well. A sterile bandage was applied     Follow-up: Return for After EMG.  Subjective:  Chief Complaint  Patient presents with   right hand pain    Long finger, numbness and tingling     History of Present Illness: Kristy Franklin is a 52 y.o. female who presents for evaluation of Right Long finger pain.  She has had issues with both hands for several months.  She was evaluated by her primary care doctor last summer.  At that time, they recommended medications, therapy and bracing.  The bracing has helped a little bit.  She also notes that her symptoms have improved with the improvement in the temperatures outside.  Currently, she is having pain to the long finger on the right hand.  This radiates proximally.  She notes some catching.  She also has some numbness and tingling in the right hand.  She wakes up in the morning, and notes some numbness and tingling to the right hand.  Previously, she had similar symptoms in the left hand, but these not currently bothering her.  She went to therapy once, and this did not improve her symptoms.   Review of Systems: No fevers or chills No numbness or tingling No chest pain No shortness of breath No bowel or bladder dysfunction No GI distress No headaches   Medical History:  Past Medical History:  Diagnosis Date   Abdominal pain  Right mid only with bowel movements   Blood in urine    Change in bowel movement    more green color   Essential hypertension, benign 11/12/2018   Family history of prostate cancer    GERD (gastroesophageal reflux disease)    Headache    after menstrual cycle every month   History of gallstones    Hot flashes due to surgical menopause 12/17/2018   Hypertension    Neck pain on right side    pulsating   Numbness and tingling of right leg    Obesity    Obesity (BMI 30.0-34.9) 12/17/2018   Ovarian  cancer, left (HCC)    PPD positive    Skin rash    Chest, forehead, left arm    Past Surgical History:  Procedure Laterality Date   BIOPSY  04/24/2019   Procedure: BIOPSY;  Surgeon: Malissa Hippo, MD;  Location: [facility];  Service: Endoscopy;;  antrum   BIOPSY  09/24/2020   Procedure: BIOPSY;  Surgeon: Dolores Frame, MD;  Location: [facility];  Service: Gastroenterology;;   CESAREAN SECTION     x2   CHOLECYSTECTOMY     20 years ago   COLONOSCOPY N/A 09/12/2018   Procedure: COLONOSCOPY;  Surgeon: Malissa Hippo, MD;  Location: [facility];  Service: Endoscopy;  Laterality: N/A;  200   ESOPHAGOGASTRODUODENOSCOPY N/A 04/24/2019   Procedure: ESOPHAGOGASTRODUODENOSCOPY (EGD);  Surgeon: Malissa Hippo, MD;  Location: [facility];  Service: Endoscopy;  Laterality: N/A;  125   ESOPHAGOGASTRODUODENOSCOPY (EGD) WITH PROPOFOL N/A 09/24/2020   Procedure: ESOPHAGOGASTRODUODENOSCOPY (EGD) WITH PROPOFOL;  Surgeon: Dolores Frame, MD;  Location: [facility];  Service: Gastroenterology;  Laterality: N/A;  1:00   IR IMAGING GUIDED PORT INSERTION  10/24/2017   IR REMOVAL TUN ACCESS W/ PORT W/O FL MOD SED  07/22/2021   OMENTECTOMY N/A 10/02/2017   Procedure: OMENTECTOMY;  Surgeon: Adolphus Birchwood, MD;  Location: [facility];  Service: Gynecology;  Laterality: N/A;   OTHER SURGICAL HISTORY  07/2017   Left ovary removed   POLYPECTOMY  04/24/2019   Procedure: POLYPECTOMY;  Surgeon: Malissa Hippo, MD;  Location: [facility];  Service: Endoscopy;;  gastric   ROBOTIC ASSISTED TOTAL HYSTERECTOMY WITH BILATERAL SALPINGO OOPHERECTOMY Right 10/02/2017   Procedure: XI ROBOTIC ASSISTED TOTAL HYSTERECTOMY WITH RIGHT SALPINGO OOPHORECTOMY;  Surgeon: Adolphus Birchwood, MD;  Location: [facility];  Service: Gynecology;  Laterality: Right;   ROBOTIC PELVIC AND PARA-AORTIC LYMPH NODE DISSECTION Bilateral 10/02/2017   Procedure: XI ROBOTIC PELVIC LYMPH NODE DISSECTION;  Surgeon: Adolphus Birchwood, MD;  Location:  [facility];  Service: Gynecology;  Laterality: Bilateral;    Family History  Problem Relation Age of Onset   Hypertension Mother    Prostate cancer Father 74       d. 23   Diabetes Paternal Aunt    Heart attack Paternal Aunt    Heart attack Paternal Grandmother 4   Thyroid disease Other 58   Social History   Tobacco Use   Smoking status: Never   Smokeless tobacco: Never  Vaping Use   Vaping Use: Never used  Substance Use Topics   Alcohol use: Never   Drug use: Never    No Known Allergies  Current Meds  Medication Sig   baclofen (LIORESAL) 10 MG tablet Take 10 mg by mouth 3 (three) times daily as needed.   clotrimazole-betamethasone (LOTRISONE) cream Apply 1 application topically 2 (two) times daily. (Patient taking differently: Apply  1 application  topically daily.)   DULoxetine (CYMBALTA) 20 MG capsule Take 40 mg by mouth every morning.   estradiol (CLIMARA) 0.05 mg/24hr patch Place 1 patch (0.05 mg total) onto the skin once a week.   lisinopril (ZESTRIL) 5 MG tablet Take 1 tablet (5 mg total) by mouth daily.   meloxicam (MOBIC) 15 MG tablet Take 15 mg by mouth daily.   pantoprazole (PROTONIX) 40 MG tablet Take 1 tablet by mouth once daily   Pramox-PE-Glycerin-Petrolatum (HEMORRHOIDAL) 1-0.25-14.4-15 % CREA Place 1 application rectally daily as needed (Hemorrhoid).    Objective: LMP 09/23/2017 (Exact Date)   Physical Exam:  General: Alert and oriented. and No acute distress. Gait: Normal gait.  Evaluation of the right hand demonstrates no deformity.  She has tenderness to palpation at the A1 pulley to the long finger.  No tenderness elsewhere.  No active triggering in clinic today.  No pain with Tinel's testing, without radiating pains.  Positive Phalen's.  Positive Durkan's compression test.  She has good grip strength.  Sensation is intact in the right hand otherwise.  Left hand without deformity.  Negative Tinel's.  Negative Phalen's.  Negative compression test at  the carpal tunnel.  No tenderness over the A1 pulleys.   IMAGING: No new imaging obtained today   New Medications:  No orders of the defined types were placed in this encounter.     Oliver Barre, MD  08/11/2022 12:00 PM

## 2022-08-18 ENCOUNTER — Ambulatory Visit (INDEPENDENT_AMBULATORY_CARE_PROVIDER_SITE_OTHER): Payer: 59 | Admitting: Physical Medicine and Rehabilitation

## 2022-08-18 DIAGNOSIS — R202 Paresthesia of skin: Secondary | ICD-10-CM

## 2022-08-18 DIAGNOSIS — M65331 Trigger finger, right middle finger: Secondary | ICD-10-CM

## 2022-08-18 DIAGNOSIS — M79641 Pain in right hand: Secondary | ICD-10-CM | POA: Diagnosis not present

## 2022-08-18 DIAGNOSIS — M542 Cervicalgia: Secondary | ICD-10-CM | POA: Diagnosis not present

## 2022-08-18 NOTE — Progress Notes (Unsigned)
Functional Pain Scale - descriptive words and definitions  Distracting (5)    Aware of pain/able to complete some ADL's but limited by pain/sleep is affected and active distractions are only slightly useful. Moderate range order  Average Pain 7  Right handed. Pain and numbness in right hand in upper palm and middle finger, finger would not straighten if bent. Pain radiates up the right arm to the shoulder

## 2022-08-22 NOTE — Procedures (Signed)
EMG & NCV Findings: Evaluation of the right median (across palm) sensory nerve showed no response (Palm).  All remaining nerves (as indicated in the following tables) were within normal limits.    All examined muscles (as indicated in the following table) showed no evidence of electrical instability.    Impression: Essentially NORMAL electrodiagnostic study of the right upper limb.  There is no significant electrodiagnostic evidence of nerve entrapment, brachial plexopathy or cervical radiculopathy.    As you know, purely sensory or demyelinating radiculopathies and chemical radiculitis may not be detected with this particular electrodiagnostic study.  Recommendations: 1.  Follow-up with referring physician.  Continue wrist splints.  Could entertain diagnostic injection. 2.  Continue current management of symptoms.  If really felt to be radicular may want a look at cervical spine.  ___________________________ Kristy Franklin Board Certified, American Board of Physical Medicine and Rehabilitation    Nerve Conduction Studies Anti Sensory Summary Table   Stim Site NR Peak (ms) Norm Peak (ms) P-T Amp (V) Norm P-T Amp Site1 Site2 Delta-P (ms) Dist (cm) Vel (m/s) Norm Vel (m/s)  Right Median Acr Palm Anti Sensory (2nd Digit)  29.5C  Wrist    3.6 <3.6 38.5 >10 Wrist Palm  0.0    Palm *NR  <2.0          Right Radial Anti Sensory (Base 1st Digit)  29.2C  Wrist    2.1 <3.1 49.3  Wrist Base 1st Digit 2.1 0.0    Right Ulnar Anti Sensory (5th Digit)  30.1C  Wrist    3.1 <3.7 35.2 >15.0 Wrist 5th Digit 3.1 14.0 45 >38   Motor Summary Table   Stim Site NR Onset (ms) Norm Onset (ms) O-P Amp (mV) Norm O-P Amp Site1 Site2 Delta-0 (ms) Dist (cm) Vel (m/s) Norm Vel (m/s)  Right Median Motor (Abd Poll Brev)  29.5C  Wrist    3.9 <4.2 8.1 >5 Elbow Wrist 3.4 17.5 51 >50  Elbow    7.3  7.4         Right Ulnar Motor (Abd Dig Min)  29.5C  Wrist    3.0 <4.2 12.4 >3 B Elbow Wrist 2.9 19.0 66 >53   B Elbow    5.9  11.2  A Elbow B Elbow 1.4 10.0 71 >53  A Elbow    7.3  11.1          EMG   Side Muscle Nerve Root Ins Act Fibs Psw Amp Dur Poly Recrt Int Dennie Bible Comment  Right Abd Poll Brev Median C8-T1 Nml Nml Nml Nml Nml 0 Nml Nml   Right 1stDorInt Ulnar C8-T1 Nml Nml Nml Nml Nml 0 Nml Nml   Right PronatorTeres Median C6-7 Nml Nml Nml Nml Nml 0 Nml Nml   Right Biceps Musculocut C5-6 Nml Nml Nml Nml Nml 0 Nml Nml   Right Deltoid Axillary C5-6 Nml Nml Nml Nml Nml 0 Nml Nml     Nerve Conduction Studies Anti Sensory Left/Right Comparison   Stim Site L Lat (ms) R Lat (ms) L-R Lat (ms) L Amp (V) R Amp (V) L-R Amp (%) Site1 Site2 L Vel (m/s) R Vel (m/s) L-R Vel (m/s)  Median Acr Palm Anti Sensory (2nd Digit)  29.5C  Wrist  3.6   38.5  Wrist Palm     Palm             Radial Anti Sensory (Base 1st Digit)  29.2C  Wrist  2.1   49.3  Wrist  Base 1st Digit     Ulnar Anti Sensory (5th Digit)  30.1C  Wrist  3.1   35.2  Wrist 5th Digit  45    Motor Left/Right Comparison   Stim Site L Lat (ms) R Lat (ms) L-R Lat (ms) L Amp (mV) R Amp (mV) L-R Amp (%) Site1 Site2 L Vel (m/s) R Vel (m/s) L-R Vel (m/s)  Median Motor (Abd Poll Brev)  29.5C  Wrist  3.9   8.1  Elbow Wrist  51   Elbow  7.3   7.4        Ulnar Motor (Abd Dig Min)  29.5C  Wrist  3.0   12.4  B Elbow Wrist  66   B Elbow  5.9   11.2  A Elbow B Elbow  71   A Elbow  7.3   11.1           Waveforms:

## 2022-08-24 ENCOUNTER — Encounter: Payer: Self-pay | Admitting: Physical Medicine and Rehabilitation

## 2022-08-24 NOTE — Progress Notes (Signed)
Kristy Franklin - 52 y.o. female MRN [id number]  Date of birth: 02-10-1971  Office Visit Note: Visit Date: 08/18/2022 PCP: Colbert Ewing, PA-C Referred by: Oliver Barre, MD  Subjective: Chief Complaint  Patient presents with   Right Hand - Numbness, Pain   HPI:  Kristy Franklin is a 52 y.o. female who comes in today at the request of Dr. Thane Edu for evaluation and management of chronic, worsening and severe pain, numbness and tingling in the Right upper extremities.  Patient is Right hand dominant.  She reports chronic several month history of pain in a feeling of numbness in the right hand with weakness.  Her main complaint has been a trigger finger in the middle finger.  She did receive injection by Dr. Dallas Schimke that did seem to help temporarily.  She did tell me that injection was pretty painful but it did seem to help some.  She gets a lot of right-handed symptoms in the upper palm that can refer up the arm even into the shoulder.  She does not really endorse symptoms down the arm.  She has had a history of some neck pain and back pain off and on chronically.  She has had no cervical or lumbar surgery.  No real imaging of the cervical spine.  She denies any real symptoms on the left hand or arm.  She has had no specific injury.  She reports that it does functionally limit what she can do.  She rates her pain function scale is a 5 out of 10 she is somewhat limited and it does affect her sleep.  Her case is complicated by history of ovarian cancer with chemotherapy.  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   I spent more than 30 minutes speaking face-to-face with the patient with 50% of the time in counseling and discussing coordination of care.    Review of Systems  Musculoskeletal:  Positive for back pain, joint pain and neck pain.  Neurological:  Positive for tingling and weakness.  All other systems reviewed and are negative.  Otherwise per  HPI.  Assessment & Plan: Visit Diagnoses:    ICD-10-CM   1. Paresthesia of skin  R20.2 NCV with EMG (electromyography)    2. Pain in right hand  M79.641     3. Trigger finger, right middle finger  M65.331     4. Cervicalgia  M54.2       Plan: Impression: Mechanical hand complaints with trigger finger and some arthritis with also tingling in the hand with referral up the arm possibly consistent with a carpal tunnel problem.  Some neck pain but no arm pain down the arm.  History of chemotherapy with some indication of neuropathy noted.  Electrodiagnostic study performed today.  Essentially NORMAL electrodiagnostic study of the right upper limb.  There is no significant electrodiagnostic evidence of nerve entrapment, brachial plexopathy or cervical radiculopathy.    As you know, purely sensory or demyelinating radiculopathies and chemical radiculitis may not be detected with this particular electrodiagnostic study.  Recommendations: 1.  Follow-up with referring physician.  Continue wrist splints.  Could entertain diagnostic injection. 2.  Continue current management of symptoms.  If really felt to be radicular may want a look at cervical spine.  Meds & Orders: No orders of the defined types were placed in this encounter.   Orders Placed This Encounter  Procedures   NCV with EMG (electromyography)    Follow-up: Return for [employer],  MD.   Procedures: No procedures performed  EMG & NCV Findings: Evaluation of the right median (across palm) sensory nerve showed no response (Palm).  All remaining nerves (as indicated in the following tables) were within normal limits.    All examined muscles (as indicated in the following table) showed no evidence of electrical instability.    Impression: Essentially NORMAL electrodiagnostic study of the right upper limb.  There is no significant electrodiagnostic evidence of nerve entrapment, brachial plexopathy or cervical radiculopathy.    As  you know, purely sensory or demyelinating radiculopathies and chemical radiculitis may not be detected with this particular electrodiagnostic study.  Recommendations: 1.  Follow-up with referring physician.  Continue wrist splints.  Could entertain diagnostic injection. 2.  Continue current management of symptoms.  If really felt to be radicular may want a look at cervical spine.  ___________________________ Elease Hashimoto Board Certified, American Board of Physical Medicine and Rehabilitation    Nerve Conduction Studies Anti Sensory Summary Table   Stim Site NR Peak (ms) Norm Peak (ms) P-T Amp (V) Norm P-T Amp Site1 Site2 Delta-P (ms) Dist (cm) Vel (m/s) Norm Vel (m/s)  Right Median Acr Palm Anti Sensory (2nd Digit)  29.5C  Wrist    3.6 <3.6 38.5 >10 Wrist Palm  0.0    Palm *NR  <2.0          Right Radial Anti Sensory (Base 1st Digit)  29.2C  Wrist    2.1 <3.1 49.3  Wrist Base 1st Digit 2.1 0.0    Right Ulnar Anti Sensory (5th Digit)  30.1C  Wrist    3.1 <3.7 35.2 >15.0 Wrist 5th Digit 3.1 14.0 45 >38   Motor Summary Table   Stim Site NR Onset (ms) Norm Onset (ms) O-P Amp (mV) Norm O-P Amp Site1 Site2 Delta-0 (ms) Dist (cm) Vel (m/s) Norm Vel (m/s)  Right Median Motor (Abd Poll Brev)  29.5C  Wrist    3.9 <4.2 8.1 >5 Elbow Wrist 3.4 17.5 51 >50  Elbow    7.3  7.4         Right Ulnar Motor (Abd Dig Min)  29.5C  Wrist    3.0 <4.2 12.4 >3 B Elbow Wrist 2.9 19.0 66 >53  B Elbow    5.9  11.2  A Elbow B Elbow 1.4 10.0 71 >53  A Elbow    7.3  11.1          EMG   Side Muscle Nerve Root Ins Act Fibs Psw Amp Dur Poly Recrt Int Dennie Bible Comment  Right Abd Poll Brev Median C8-T1 Nml Nml Nml Nml Nml 0 Nml Nml   Right 1stDorInt Ulnar C8-T1 Nml Nml Nml Nml Nml 0 Nml Nml   Right PronatorTeres Median C6-7 Nml Nml Nml Nml Nml 0 Nml Nml   Right Biceps Musculocut C5-6 Nml Nml Nml Nml Nml 0 Nml Nml   Right Deltoid Axillary C5-6 Nml Nml Nml Nml Nml 0 Nml Nml     Nerve Conduction  Studies Anti Sensory Left/Right Comparison   Stim Site L Lat (ms) R Lat (ms) L-R Lat (ms) L Amp (V) R Amp (V) L-R Amp (%) Site1 Site2 L Vel (m/s) R Vel (m/s) L-R Vel (m/s)  Median Acr Palm Anti Sensory (2nd Digit)  29.5C  Wrist  3.6   38.5  Wrist Palm     Palm             Radial Anti Sensory (Base 1st Digit)  29.2C  Wrist  2.1   49.3  Wrist Base 1st Digit     Ulnar Anti Sensory (5th Digit)  30.1C  Wrist  3.1   35.2  Wrist 5th Digit  45    Motor Left/Right Comparison   Stim Site L Lat (ms) R Lat (ms) L-R Lat (ms) L Amp (mV) R Amp (mV) L-R Amp (%) Site1 Site2 L Vel (m/s) R Vel (m/s) L-R Vel (m/s)  Median Motor (Abd Poll Brev)  29.5C  Wrist  3.9   8.1  Elbow Wrist  51   Elbow  7.3   7.4        Ulnar Motor (Abd Dig Min)  29.5C  Wrist  3.0   12.4  B Elbow Wrist  66   B Elbow  5.9   11.2  A Elbow B Elbow  71   A Elbow  7.3   11.1           Waveforms:             Clinical History: No specialty comments available.     Objective:  VS:  HT:    WT:   BMI:     BP:   HR: bpm  TEMP: ( )  RESP:  Physical Exam Vitals and nursing note reviewed.  Constitutional:      General: She is not in acute distress.    Appearance: Normal appearance. She is well-developed.  HENT:     Head: Normocephalic and atraumatic.     Nose: Nose normal.     Mouth/Throat:     Mouth: Mucous membranes are moist.     Pharynx: Oropharynx is clear.  Eyes:     Conjunctiva/sclera: Conjunctivae normal.     Pupils: Pupils are equal, round, and reactive to light.  Cardiovascular:     Rate and Rhythm: Regular rhythm.  Pulmonary:     Effort: Pulmonary effort is normal. No respiratory distress.  Abdominal:     General: There is no distension.     Palpations: Abdomen is soft.     Tenderness: There is no guarding.  Musculoskeletal:        General: No swelling, tenderness or deformity.     Cervical back: Normal range of motion and neck supple.     Right lower leg: No edema.     Left lower leg: No  edema.     Comments: Inspection reveals no atrophy of the bilateral APB or FDI or hand intrinsics. There is no swelling, color changes, allodynia or dystrophic changes. There is 5 out of 5 strength in the bilateral wrist extension, finger abduction and long finger flexion. There is intact sensation to light touch in all dermatomal and peripheral nerve distributions. There is a negative Froment's test bilaterally. There is a negative Tinel's test at the bilateral wrist and elbow. There is a negative Phalen's test bilaterally. There is a negative Hoffmann's test bilaterally.  Skin:    General: Skin is warm and dry.     Findings: No erythema or rash.  Neurological:     General: No focal deficit present.     Mental Status: She is alert and oriented to person, place, and time.     Motor: No weakness or abnormal muscle tone.     Coordination: Coordination normal.     Gait: Gait normal.  Psychiatric:        Mood and Affect: Mood normal.        Behavior: Behavior normal.  Thought Content: Thought content normal.      Imaging: No results found.

## 2022-08-29 ENCOUNTER — Telehealth: Payer: Self-pay | Admitting: Orthopedic Surgery

## 2022-08-29 NOTE — Telephone Encounter (Signed)
Dr. Dallas Schimke pt -  Kristy Franklin called and stated that she had Kristy NCS test done and is doing much better, no pain in her hands.  She wants to know if she still needs to schedule an appointment to come back here.  Please advise.

## 2022-09-08 DIAGNOSIS — K219 Gastro-esophageal reflux disease without esophagitis: Secondary | ICD-10-CM | POA: Diagnosis not present

## 2022-09-08 DIAGNOSIS — Z13220 Encounter for screening for lipoid disorders: Secondary | ICD-10-CM | POA: Diagnosis not present

## 2022-09-08 DIAGNOSIS — Z131 Encounter for screening for diabetes mellitus: Secondary | ICD-10-CM | POA: Diagnosis not present

## 2022-09-08 DIAGNOSIS — Z1329 Encounter for screening for other suspected endocrine disorder: Secondary | ICD-10-CM | POA: Diagnosis not present

## 2022-09-08 DIAGNOSIS — I10 Essential (primary) hypertension: Secondary | ICD-10-CM | POA: Diagnosis not present

## 2022-09-15 ENCOUNTER — Other Ambulatory Visit (HOSPITAL_COMMUNITY): Payer: Self-pay

## 2022-09-15 DIAGNOSIS — I10 Essential (primary) hypertension: Secondary | ICD-10-CM | POA: Diagnosis not present

## 2022-09-15 DIAGNOSIS — F419 Anxiety disorder, unspecified: Secondary | ICD-10-CM | POA: Diagnosis not present

## 2022-09-15 DIAGNOSIS — R1011 Right upper quadrant pain: Secondary | ICD-10-CM

## 2022-09-15 DIAGNOSIS — M25541 Pain in joints of right hand: Secondary | ICD-10-CM | POA: Diagnosis not present

## 2022-09-15 DIAGNOSIS — R110 Nausea: Secondary | ICD-10-CM

## 2022-09-15 DIAGNOSIS — R7303 Prediabetes: Secondary | ICD-10-CM | POA: Diagnosis not present

## 2022-09-15 DIAGNOSIS — E6609 Other obesity due to excess calories: Secondary | ICD-10-CM | POA: Diagnosis not present

## 2022-09-15 DIAGNOSIS — R748 Abnormal levels of other serum enzymes: Secondary | ICD-10-CM | POA: Diagnosis not present

## 2022-09-15 DIAGNOSIS — K219 Gastro-esophageal reflux disease without esophagitis: Secondary | ICD-10-CM | POA: Diagnosis not present

## 2022-09-15 DIAGNOSIS — M25542 Pain in joints of left hand: Secondary | ICD-10-CM | POA: Diagnosis not present

## 2022-09-15 DIAGNOSIS — M159 Polyosteoarthritis, unspecified: Secondary | ICD-10-CM | POA: Diagnosis not present

## 2022-09-15 DIAGNOSIS — M545 Low back pain, unspecified: Secondary | ICD-10-CM | POA: Diagnosis not present

## 2022-09-21 ENCOUNTER — Ambulatory Visit (HOSPITAL_COMMUNITY)
Admission: RE | Admit: 2022-09-21 | Discharge: 2022-09-21 | Disposition: A | Discharge status: Home / Self Care | Payer: 59 | Source: Ambulatory Visit

## 2022-09-21 DIAGNOSIS — R110 Nausea: Secondary | ICD-10-CM | POA: Diagnosis not present

## 2022-09-21 DIAGNOSIS — K760 Fatty (change of) liver, not elsewhere classified: Secondary | ICD-10-CM | POA: Diagnosis not present

## 2022-09-21 DIAGNOSIS — R1011 Right upper quadrant pain: Secondary | ICD-10-CM | POA: Diagnosis not present

## 2022-12-08 DIAGNOSIS — Z1329 Encounter for screening for other suspected endocrine disorder: Secondary | ICD-10-CM | POA: Diagnosis not present

## 2022-12-08 DIAGNOSIS — M25542 Pain in joints of left hand: Secondary | ICD-10-CM | POA: Diagnosis not present

## 2022-12-08 DIAGNOSIS — I10 Essential (primary) hypertension: Secondary | ICD-10-CM | POA: Diagnosis not present

## 2022-12-08 DIAGNOSIS — R1011 Right upper quadrant pain: Secondary | ICD-10-CM | POA: Diagnosis not present

## 2022-12-08 DIAGNOSIS — R748 Abnormal levels of other serum enzymes: Secondary | ICD-10-CM | POA: Diagnosis not present

## 2022-12-08 DIAGNOSIS — Z79890 Hormone replacement therapy (postmenopausal): Secondary | ICD-10-CM | POA: Diagnosis not present

## 2022-12-08 DIAGNOSIS — R7303 Prediabetes: Secondary | ICD-10-CM | POA: Diagnosis not present

## 2022-12-08 DIAGNOSIS — E871 Hypo-osmolality and hyponatremia: Secondary | ICD-10-CM | POA: Diagnosis not present

## 2022-12-08 DIAGNOSIS — R110 Nausea: Secondary | ICD-10-CM | POA: Diagnosis not present

## 2022-12-08 DIAGNOSIS — Z23 Encounter for immunization: Secondary | ICD-10-CM | POA: Diagnosis not present

## 2022-12-08 DIAGNOSIS — F419 Anxiety disorder, unspecified: Secondary | ICD-10-CM | POA: Diagnosis not present

## 2022-12-08 DIAGNOSIS — Z13220 Encounter for screening for lipoid disorders: Secondary | ICD-10-CM | POA: Diagnosis not present

## 2022-12-21 ENCOUNTER — Encounter (INDEPENDENT_AMBULATORY_CARE_PROVIDER_SITE_OTHER): Payer: 59 | Admitting: Gastroenterology

## 2022-12-22 ENCOUNTER — Encounter (INDEPENDENT_AMBULATORY_CARE_PROVIDER_SITE_OTHER): Payer: Self-pay | Admitting: Gastroenterology

## 2022-12-22 ENCOUNTER — Ambulatory Visit (INDEPENDENT_AMBULATORY_CARE_PROVIDER_SITE_OTHER): Payer: 59 | Admitting: Gastroenterology

## 2022-12-22 VITALS — BP 138/68 | HR 76 | Temp 97.3°F | Ht 63.0 in | Wt 179.3 lb

## 2022-12-22 DIAGNOSIS — K760 Fatty (change of) liver, not elsewhere classified: Secondary | ICD-10-CM | POA: Diagnosis not present

## 2022-12-22 DIAGNOSIS — K589 Irritable bowel syndrome without diarrhea: Secondary | ICD-10-CM | POA: Insufficient documentation

## 2022-12-22 DIAGNOSIS — R1011 Right upper quadrant pain: Secondary | ICD-10-CM

## 2022-12-22 DIAGNOSIS — R6881 Early satiety: Secondary | ICD-10-CM | POA: Insufficient documentation

## 2022-12-22 DIAGNOSIS — Z23 Encounter for immunization: Secondary | ICD-10-CM | POA: Diagnosis not present

## 2022-12-22 MED ORDER — HYOSCYAMINE SULFATE 0.125 MG PO TABS: 0.1250 mg | ORAL_TABLET | Freq: Four times a day (QID) | ORAL | 1 refills | Status: AC | PRN

## 2022-12-22 NOTE — Progress Notes (Signed)
Kristy Franklin, M.D. Gastroenterology & Hepatology [facility] Gastroenterology [address] [place], [place] [address]  Primary Care Physician: Donetta Potts, MD [address] [place] [address]  I will communicate my assessment and recommendations to the referring MD via EMR.  Problems: Chronic RUQ pain Fatty liver Early satiety  History of Present Illness: Kristy Franklin is a 52 y.o. 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  The patient was last seen on 07/07/2020. At that time, the patient was scheduled for EGD given complaints of abdominal pain.  She was advised to increase pantoprazole to 40 mg twice a day.  Esophagogastroduodenospy with findings below.  Patient reports that she has had recurrent episodes of RUQ abdominal pain, which has been happening 2 times per week in average. She feels some mild stabbing pain in her RUQ that does not radiate anywhere else. She is currently taking baclofen for this pain, previously taking meloxicam for it but she has decreased the use of these as recommended by her PCP.She tried amitriptyline in the past, but she had issues with falling asleep during the day and almost had a MVA. It became difficult for her to use it as she had to wake up earlier for work.  She believes that the amitriptyline led to significant improvement of her abdominal pain but could not tolerate it. She is currently on Cymbalta but does not feel that this has improved her pain and states that she believes they have been prescribing this for anxiety, although she does not have any anxiety.   She denies any melena or hematochezia.  The patient denies having any vomiting, fever, chills, hematochezia, melena, hematemesis, abdominal distention, diarrhea, jaundice, pruritus or weight loss.  She reports  having frequent headache during the day. This also may come with episodes of nausea. May have some nausea occasionally without headache. She does not vomit.  States she feels full easily after having a small meal. Is trying to eat smaller meals during the day.  She also reports having tenesmus despite having a bowel movement. She is having a BM every day.  Liver enzymes on 06/22/2020 were normal with AST of 23, ALT of 20, total bilirubin 0.7 and alkaline phosphatase 73.  Patient had an abdominal ultrasound on 09/21/2022 that showed fatty liver and prior cholecystectomy.  Last EGD: 09/24/2020 Normal normal esophagus, stomach and duodenum.  Biopsies of the the stomach and small bowel were negative for H. pylori or inflammation.  Last Colonoscopy: 2020  - diverticulosis, hemorrhoids   Past Medical History: 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   Hypertension    Neck pain on right side    pulsating   Numbness and tingling of right leg    Obesity    Obesity (BMI 30.0-34.9) 12/17/2018   Ovarian cancer, left (HCC)    PPD positive    Skin rash  Chest, forehead, left arm    Past Surgical History: Past Surgical History:  Procedure Laterality Date   BIOPSY  04/24/2019   Procedure: BIOPSY;  Surgeon: Malissa Hippo, MD;  [facility];  Service: Endoscopy;;  antrum   BIOPSY  09/24/2020   Procedure: BIOPSY;  Surgeon: Dolores Frame, MD;  [facility]: [facility];  Service: Gastroenterology;;   CESAREAN SECTION     x2   CHOLECYSTECTOMY     20 years ago   COLONOSCOPY N/A 09/12/2018   Procedure:  COLONOSCOPY;  Surgeon: Malissa Hippo, MD;  [facility];  Service: Endoscopy;  Laterality: N/A;  200   ESOPHAGOGASTRODUODENOSCOPY N/A 04/24/2019   Procedure: ESOPHAGOGASTRODUODENOSCOPY (EGD);  Surgeon: Malissa Hippo, MD;  [facility];  Service: Endoscopy;  Laterality: N/A;  125   ESOPHAGOGASTRODUODENOSCOPY (EGD) WITH PROPOFOL N/A 09/24/2020   Procedure: ESOPHAGOGASTRODUODENOSCOPY (EGD) WITH PROPOFOL;  Surgeon: Dolores Frame, MD;  [facility];  Service: Gastroenterology;  Laterality: N/A;  1:00   IR IMAGING GUIDED PORT INSERTION  10/24/2017   IR REMOVAL TUN ACCESS W/ PORT W/O FL MOD SED  07/22/2021   OMENTECTOMY N/A 10/02/2017   Procedure: OMENTECTOMY;  Surgeon: Adolphus Birchwood, MD;  Location: [facility];  Service: Gynecology;  Laterality: N/A;   OTHER SURGICAL HISTORY  07/2017   Left ovary removed   POLYPECTOMY  04/24/2019   Procedure: POLYPECTOMY;  Surgeon: Malissa Hippo, MD;  [facility];  Service: Endoscopy;;  gastric   ROBOTIC ASSISTED TOTAL HYSTERECTOMY WITH BILATERAL SALPINGO OOPHERECTOMY Right 10/02/2017   Procedure: XI ROBOTIC ASSISTED TOTAL HYSTERECTOMY WITH RIGHT SALPINGO OOPHORECTOMY;  Surgeon: Adolphus Birchwood, MD;  Location: [facility];  Service: Gynecology;  Laterality: Right;   ROBOTIC PELVIC AND PARA-AORTIC LYMPH NODE DISSECTION Bilateral 10/02/2017   Procedure: XI ROBOTIC PELVIC LYMPH NODE DISSECTION;  Surgeon: Adolphus Birchwood, MD;  Location: [facility];  Service: Gynecology;  Laterality: Bilateral;    Family History: Family History  Problem Relation Age of Onset   Hypertension Mother    Prostate cancer Father 91       d. 15   Diabetes Paternal Aunt    Heart attack Paternal Aunt    Heart attack Paternal Grandmother 54   Thyroid disease Other 68    Social History: Social History   Tobacco Use  Smoking Status Never  Smokeless Tobacco Never   Social History   Substance and Sexual Activity  Alcohol Use Never   Social History    Substance and Sexual Activity  Drug Use Never    Allergies: No Known Allergies  Medications: Current Outpatient Medications  Medication Sig Dispense Refill   baclofen (LIORESAL) 10 MG tablet Take 10 mg by mouth 3 (three) times daily as needed.     clotrimazole-betamethasone (LOTRISONE) cream Apply 1 application topically 2 (two) times daily. (Patient taking differently: Apply 1 application  topically daily.) 30 g 0   DULoxetine (CYMBALTA) 20 MG capsule Take 20 mg by mouth every morning.     estradiol (CLIMARA) 0.05 mg/24hr patch Place 1 patch (0.05 mg total) onto the skin once a week. 12 patch 3   losartan (COZAAR) 50 MG tablet Take 50 mg by mouth daily.     pantoprazole (PROTONIX) 40 MG tablet Take 1 tablet by mouth once daily 90 tablet 1   Pramox-PE-Glycerin-Petrolatum (HEMORRHOIDAL) 1-0.25-14.4-15 % CREA Place 1 application rectally daily as needed (Hemorrhoid).     No current facility-administered medications for this visit.  Review of Systems: GENERAL: negative for malaise, night sweats HEENT: No changes in hearing or vision, no nose bleeds or other nasal problems. NECK: Negative for lumps, goiter, pain and significant neck swelling RESPIRATORY: Negative for cough, wheezing CARDIOVASCULAR: Negative for chest pain, leg swelling, palpitations, orthopnea GI: SEE HPI MUSCULOSKELETAL: Negative for joint pain or swelling, back pain, and muscle pain. SKIN: Negative for lesions, rash PSYCH: Negative for sleep disturbance, mood disorder and recent psychosocial stressors. HEMATOLOGY Negative for prolonged bleeding, bruising easily, and swollen nodes. ENDOCRINE: Negative for cold or heat intolerance, polyuria, polydipsia and goiter. NEURO: negative for tremor, gait imbalance, syncope and seizures. The remainder of the review of systems is noncontributory.   Physical Exam: BP 138/68 (BP Location: Left Arm, Patient Position: Sitting, Cuff Size: Large)   Pulse 76   Temp (!) 97.3  F (36.3 C) (Temporal)   Ht 5\' 3"  (1.6 m)   Wt 179 lb 4.8 oz (81.3 kg)   LMP 09/23/2017 (Exact Date)   BMI 31.76 kg/m  GENERAL: The patient is AO x3, in no acute distress. HEENT: Head is normocephalic and atraumatic. EOMI are intact. Mouth is well hydrated and without lesions. NECK: Supple. No masses LUNGS: Clear to auscultation. No presence of rhonchi/wheezing/rales. Adequate chest expansion HEART: RRR, normal s1 and s2. ABDOMEN: mildly tender in RUQ, no guarding, no peritoneal signs, and nondistended. BS +. No masses. EXTREMITIES: Without any cyanosis, clubbing, rash, lesions or edema. NEUROLOGIC: AOx3, no focal motor deficit. SKIN: no jaundice, no rashes  Imaging/Labs: as above  I personally reviewed and interpreted the available labs, imaging and endoscopic files.  Impression and Plan: Kristy Franklin is a 52 y.o. 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  Patient has presented recurrent episodes of abdominal pain chronically.  This is improved in the past with TCA but unfortunately she did not tolerate this medication due to the presence of significant sedation.  She has had upper endoscopic evaluation that did not show any abnormalities and CT scan of the abdomen and pelvis with IV contrast in 2022 was unremarkable.  I suspect that given the chronicity of her symptoms, her symptoms are likely functional in nature.  She will benefit from implementing the use of Levsin as needed to relieve her abdominal pain.  As she has also presented some new onset of early satiety, we will evaluate her symptoms further with a gastric emptying study.  In terms of her abnormalities found on the most recent ultrasound, she was found to have some fatty liver.  It is not clear if she has presented any new elevation of her LFTs as her previous LFTs in the record  were normal.  Will request her most recent blood workup and determine if further testing is needed however this will calculate NAFLD the score at that time).  She was advised to implement Mediterranean diet if possible.  -Start Levsin 1 tablet q6h as needed for abdominal pain -Schedule 4-hour gastric emptying study -Will request most recent blood workup from PCPs office -Implement a Mediterranean diet  All questions were answered.      Kristy Blazing, MD Gastroenterology and Hepatology [facility] Gastroenterology

## 2022-12-22 NOTE — Patient Instructions (Signed)
Empezar a Dance movement psychotherapist cada 6 horas a necesidad para el dolor abdominal Agendar estudio de vaciamiento gastrico Solicitaremos a su medico primario los resultados de sus examenes mas recientes Intente implementar una dieta "mediterranea" con mas vegetales/frutas y proteina con pescado y pollo (menos carbohidratos o carnes rojas/grasas)

## 2022-12-26 ENCOUNTER — Telehealth (INDEPENDENT_AMBULATORY_CARE_PROVIDER_SITE_OTHER): Payer: Self-pay | Admitting: Gastroenterology

## 2022-12-26 NOTE — Telephone Encounter (Signed)
GES scheduled for 12/29/22 at 8 am. Pt to arrive at 7:45am. No stomach meds morning of. Will be at [facility] up to 4 hours. Left detail message on voicemail (ok per dpr)

## 2022-12-26 NOTE — Telephone Encounter (Signed)
[facility]  Service From - To Date NA  Admission Type 9  Diagnosis Code 1 K3184 - Gastroparesis  Diagnosis Code 2 R1011 - Right upper quadrant pain  Procedure Code 1 95621  Quantity 1 Units  Procedure From - To Date 2022-12-26  Status NO AUTH REQUIRED  Message PRECERT IS NOT REQUIRED OR ONLY REQUIRED IN UNIQUE CIRCUMSTANCES FOR MEDICAID REFER TO PRIOR AUTH TOOL ON [email or web address] FOR ALL OTHERS REFER TO CODE SEARCH TOOL ON [email or web address] COVERAGE OF SERVICES ARE SUBJECT TO BENEFITS AND ELIGIBILITY

## 2022-12-29 ENCOUNTER — Ambulatory Visit (HOSPITAL_COMMUNITY)
Admission: RE | Admit: 2022-12-29 | Discharge: 2022-12-29 | Disposition: A | Discharge status: Home / Self Care | Payer: 59 | Source: Ambulatory Visit | Attending: Gastroenterology | Admitting: Gastroenterology

## 2022-12-29 ENCOUNTER — Encounter (HOSPITAL_COMMUNITY): Payer: Self-pay

## 2022-12-29 ENCOUNTER — Telehealth (INDEPENDENT_AMBULATORY_CARE_PROVIDER_SITE_OTHER): Payer: Self-pay | Admitting: Gastroenterology

## 2022-12-29 DIAGNOSIS — Z0389 Encounter for observation for other suspected diseases and conditions ruled out: Secondary | ICD-10-CM | POA: Diagnosis not present

## 2022-12-29 DIAGNOSIS — R6881 Early satiety: Secondary | ICD-10-CM | POA: Insufficient documentation

## 2022-12-29 HISTORY — DX: Type 2 diabetes mellitus without complications: E11.9

## 2022-12-29 MED ORDER — TECHNETIUM TC 99M SULFUR COLLOID
2.0000 | Freq: Once | INTRAVENOUS | Status: AC | PRN
  Administered 2022-12-29: 1.95 via ORAL

## 2022-12-29 NOTE — Telephone Encounter (Signed)
I received the results of the most recent blood workup from 12/01/2022 showing a CBC with WBC 8.3, hemoglobin 13.4, platelets 362, AST 32, ALT 41, sodium 137, potassium 4.4, chloride 106, glucose 135, albumin 4.4, alkaline phosphatase 110, total bilirubin 0.5, TSH 1.5, cholesterol 172, triglycerides 122, LDL 99.6.  NAFLD score was -3.57 -low risk for fibrosis  I attempted calling the patient to discuss results of the blood workup and as well the gastric emptying study showing significantly delayed gastric emptying compatible with gastroparesis.  She is not currently taking any medication that could have led to this presentation.  I left a detailed voice message explaining the findings and the recommendation of eating 5-6 times per day small portions per day.  She will need a follow-up appointment to discuss the findings and possible pharmacological measures.  Hi Mitzie,  Can you please schedule a follow up appointment for this patient in 2-3 months with me ?  Thanks,  Katrinka Blazing, MD Gastroenterology and Hepatology [facility] Gastroenterology

## 2023-01-10 ENCOUNTER — Inpatient Hospital Stay (HOSPITAL_BASED_OUTPATIENT_CLINIC_OR_DEPARTMENT_OTHER): Payer: 59 | Admitting: Obstetrics & Gynecology

## 2023-01-10 ENCOUNTER — Encounter: Payer: Self-pay | Admitting: Obstetrics & Gynecology

## 2023-01-10 ENCOUNTER — Inpatient Hospital Stay: Payer: 59 | Attending: Obstetrics & Gynecology

## 2023-01-10 VITALS — BP 140/62 | HR 71 | Temp 98.4°F | Resp 20 | Wt 181.2 lb

## 2023-01-10 DIAGNOSIS — Z8543 Personal history of malignant neoplasm of ovary: Secondary | ICD-10-CM | POA: Insufficient documentation

## 2023-01-10 DIAGNOSIS — Z9221 Personal history of antineoplastic chemotherapy: Secondary | ICD-10-CM | POA: Insufficient documentation

## 2023-01-10 DIAGNOSIS — E8941 Symptomatic postprocedural ovarian failure: Secondary | ICD-10-CM | POA: Diagnosis not present

## 2023-01-10 DIAGNOSIS — C562 Malignant neoplasm of left ovary: Secondary | ICD-10-CM

## 2023-01-10 DIAGNOSIS — C541 Malignant neoplasm of endometrium: Secondary | ICD-10-CM

## 2023-01-10 DIAGNOSIS — Z8542 Personal history of malignant neoplasm of other parts of uterus: Secondary | ICD-10-CM | POA: Diagnosis not present

## 2023-01-10 MED ORDER — ESTRADIOL 0.05 MG/24HR TD PTWK: 0.0500 mg | MEDICATED_PATCH | TRANSDERMAL | 3 refills | Status: DC

## 2023-01-10 NOTE — Assessment & Plan Note (Signed)
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 BRCA negative.  S/p completion of chemotherapy with 6 cycles adjuvant carboplatin and paclitaxel in December, 2019. Doubt clinically significant abdominal discomfort, unremarkable exam.  Normal tumor markers Unclear etiology of vulva pruritus   >Review the CA 125 result collected today > Continue annual surveillance. Appropriate to be followed by a generalist

## 2023-01-10 NOTE — Patient Instructions (Signed)
Return prn 

## 2023-01-10 NOTE — Progress Notes (Signed)
Follow Up Note: Gyn-Onc  Kristy Franklin 52 y.o. female  CC: She presents for a f/u visit   HPI: The oncology history was reviewed.  Interval History:   She denies vaginal bleeding, cough, lethargy, abdominal distention, weight loss or change in her bowel habits.  .  She  is approaching the five-year mark since completing chemotherapy. She has been doing well with good tumor markers. However, she expresses concern about a rash that has appeared in a similar pattern to when she had cancer. Her primary care doctor has yet to provide an explanation for the rash, and the patient is considering a dermatology referral.  Additionally, the patient has been on hormone replacement therapy via patches. She reports that her pharmacy has stopped refilling the patches until further approval from the provider. The patient is open to the idea of tapering off the patches to assess for the return of hot flashes.   Review of Systems  Review of Systems  Constitutional:  Negative for malaise/fatigue and weight loss.  Respiratory:  Negative for shortness of breath and wheezing.   Cardiovascular:  Negative for chest pain and leg swelling.  Gastrointestinal:  Positive for abdominal pain; negative blood in stool, constipation, nausea and vomiting.  Genitourinary:  Negative for dysuria, frequency, hematuria and urgency. Positive for hesitancy, incontinence Musculoskeletal:  Negative for joint pain and myalgias.  Neurological:  Negative for weakness.  Psychiatric/Behavioral:  Negative for depression. The patient does not have insomnia.    Current medications, allergy, social history, past surgical history, past medical history, family history were all reviewed.  Vitals: BP (!) 140/62 (BP Location: Left Arm, Patient Position: Sitting)   Pulse 71   Temp 98.4 F (36.9 C) (Oral)   Resp 20   Wt 181 lb 3.2 oz (82.2 kg)   LMP 09/23/2017 (Exact Date)   SpO2 100%   BMI 32.10 kg/m     Physical Exam Exam  conducted with a chaperone present.  Constitutional:      General: She is not in acute distress. Cardiovascular:     Rate and Rhythm: Normal rate and regular rhythm.  Pulmonary:     Effort: Pulmonary effort is normal.     Breath sounds: Normal breath sounds. No wheezing or rhonchi.  Abdominal:     Palpations: Abdomen is soft. Mild RLQ tenderness    Tenderness: There is no abdominal tenderness. There is no right CVA tenderness or left CVA tenderness.     Hernia: No hernia is present.  Genitourinary:    General: Normal vulva.     Urethra: No urethral lesion.     Vagina: No lesions. No bleeding.  Thin, white discharge Musculoskeletal:     Cervical back: Neck supple.     Right lower leg: No edema.     Left lower leg: No edema.  Lymphadenopathy:     Upper Body:     Right upper body: No supraclavicular adenopathy.     Left upper body: No supraclavicular adenopathy.     Lower Body: No right inguinal adenopathy. No left inguinal adenopathy.  Skin:    Findings: No rash.  Neurological:     Mental Status: She is oriented to person, place, and time.   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 BRCA negative.  S/p completion of chemotherapy with 6 cycles adjuvant carboplatin and paclitaxel in December, 2019. Unremarkable exam.  Normal tumor markers   >Review  the resutls of the CA 125 drawn today >Recommend annual follow-up; appropriate to be followed by a generalist gynecologist   I personally spent 25 minutes face-to-face and non-face-to-face in the care of this patient, which includes all pre, intra, and post visit time on the date of service.   Antionette Char, MD

## 2023-01-11 LAB — CA 125: Cancer Antigen (CA) 125: 6.2 U/mL (ref 0.0–38.1)

## 2023-01-16 ENCOUNTER — Telehealth: Payer: Self-pay | Admitting: *Deleted

## 2023-01-16 NOTE — Telephone Encounter (Signed)
-----   Message from Doylene Bode sent at 01/16/2023  8:42 AM EST ----- Please give her a call and let her know her CA 125 level is stable and within normal range.

## 2023-01-16 NOTE — Telephone Encounter (Signed)
Spoke with Kristy Franklin and relayed message from Warner Mccreedy, NP [facility] interpreter spanish id [phone] That her CA 125 level is stable and within normal range. Pt verbalized understanding and thanked the office for calling.

## 2023-02-23 DIAGNOSIS — Z23 Encounter for immunization: Secondary | ICD-10-CM | POA: Diagnosis not present

## 2023-03-07 ENCOUNTER — Encounter (INDEPENDENT_AMBULATORY_CARE_PROVIDER_SITE_OTHER): Payer: Self-pay | Admitting: Gastroenterology

## 2023-03-29 ENCOUNTER — Encounter (INDEPENDENT_AMBULATORY_CARE_PROVIDER_SITE_OTHER): Payer: Self-pay | Admitting: Gastroenterology

## 2023-03-29 ENCOUNTER — Ambulatory Visit (INDEPENDENT_AMBULATORY_CARE_PROVIDER_SITE_OTHER): Payer: No Typology Code available for payment source | Admitting: Gastroenterology

## 2023-03-29 VITALS — BP 137/81 | HR 80 | Temp 97.5°F | Ht 63.0 in | Wt 182.4 lb

## 2023-03-29 DIAGNOSIS — K3184 Gastroparesis: Secondary | ICD-10-CM | POA: Diagnosis not present

## 2023-03-29 DIAGNOSIS — K760 Fatty (change of) liver, not elsewhere classified: Secondary | ICD-10-CM | POA: Diagnosis not present

## 2023-03-29 DIAGNOSIS — K7581 Nonalcoholic steatohepatitis (NASH): Secondary | ICD-10-CM | POA: Insufficient documentation

## 2023-03-29 MED ORDER — METOCLOPRAMIDE HCL 5 MG PO TABS: 5.0000 mg | ORAL_TABLET | Freq: Three times a day (TID) | ORAL | 2 refills | Status: AC | PRN

## 2023-03-29 NOTE — Progress Notes (Signed)
Katrinka Blazing, M.D. Gastroenterology & Hepatology [facility] [address] [place], [place] [address]  Primary Care Physician: Donetta Potts, MD [address] [place] [address]  I will communicate my assessment and recommendations to the referring MD via EMR.  Problems: Gastroparesis NASH   History of Present Illness: Kristy Franklin is a 53 y.o. 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  The patient was last seen on 12/22/2022. At that time, the patient was started on Levsin as needed for abdominal pain.  Gastric emptying study was ordered and performed on 12/29/2022 which showed delayed gastric emptying with 55% of food emptied at 4 hours.  Patient reports she has intermittent episodes of abdominal pain in the RUQ. States she is having the pain once a week, does not radiate anywhere else.  Patient reports that she has recently noticed that after she has a meal at night, next day she would have generalized fatigue and feeling "her body very heavy, with arms feeling heavy". This does not happen that much if she has a lighter meal. She has also noticed frequent flatulence or eructation. Has some nausea occasionally. She has had intermittent bloating sensation, especially in her upper abdomen. Has early satiety.  Has not noted any difference in pain control with the use of Levsin.  The patient denies having any nausea, vomiting, fever, chills, hematochezia, melena, hematemesis, diarrhea, jaundice, pruritus. Has gained 2 lb recently.  Patient had an abdominal ultrasound on 09/21/2022 that showed fatty liver and prior cholecystectomy.  most recent blood workup from 12/01/2022 showing a CBC with WBC 8.3, hemoglobin 13.4, platelets 362, AST 32, ALT 41, sodium 137, potassium 4.4, chloride 106, glucose  135, albumin 4.4, alkaline phosphatase 110, total bilirubin 0.5, TSH 1.5, cholesterol 172, triglycerides 122, LDL 99.6.    Last EGD: 09/24/2020 Normal normal esophagus, stomach and duodenum.  Biopsies of the the stomach and small bowel were negative for H. pylori or inflammation.   Last Colonoscopy: 2020  - diverticulosis, hemorrhoids   Past Medical History: Past Medical History:  Diagnosis Date   Abdominal pain    Right mid only with bowel movements   Blood in urine    Change in bowel movement    more green color   Diabetes mellitus without complication (HCC)    Essential hypertension, benign 11/12/2018   Family history of prostate cancer    GERD (gastroesophageal reflux disease)    Headache    after menstrual cycle every month   History of gallstones    Hot flashes due to surgical menopause 12/17/2018   Hypertension    Neck pain on right side    pulsating   Numbness and tingling of right leg    Obesity    Obesity (BMI 30.0-34.9) 12/17/2018   Ovarian cancer, left (HCC)    PPD positive    Skin rash    Chest, forehead, left arm    Past Surgical History: Past Surgical History:  Procedure Laterality Date   BIOPSY  04/24/2019   Procedure: BIOPSY;  Surgeon: Malissa Hippo, MD;  [facility];  Service: Endoscopy;;  antrum   BIOPSY  09/24/2020   Procedure: BIOPSY;  Surgeon: Dolores Frame, MD;  [facility]: [facility];  Service: Gastroenterology;;   CESAREAN SECTION     x2   CHOLECYSTECTOMY  20 years ago   COLONOSCOPY N/A 09/12/2018   Procedure: COLONOSCOPY;  Surgeon: Malissa Hippo, MD;  [facility]: [facility];  Service: Endoscopy;  Laterality: N/A;  200   ESOPHAGOGASTRODUODENOSCOPY N/A 04/24/2019   Procedure: ESOPHAGOGASTRODUODENOSCOPY (EGD);  Surgeon: Malissa Hippo, MD;  [facility];  Service: Endoscopy;  Laterality: N/A;  125   ESOPHAGOGASTRODUODENOSCOPY (EGD) WITH PROPOFOL N/A 09/24/2020   Procedure: ESOPHAGOGASTRODUODENOSCOPY  (EGD) WITH PROPOFOL;  Surgeon: Dolores Frame, MD;  [facility];  Service: Gastroenterology;  Laterality: N/A;  1:00   IR IMAGING GUIDED PORT INSERTION  10/24/2017   IR REMOVAL TUN ACCESS W/ PORT W/O FL MOD SED  07/22/2021   OMENTECTOMY N/A 10/02/2017   Procedure: OMENTECTOMY;  Surgeon: Adolphus Birchwood, MD;  Location: [facility];  Service: Gynecology;  Laterality: N/A;   OTHER SURGICAL HISTORY  07/2017   Left ovary removed   POLYPECTOMY  04/24/2019   Procedure: POLYPECTOMY;  Surgeon: Malissa Hippo, MD;  [facility];  Service: Endoscopy;;  gastric   ROBOTIC ASSISTED TOTAL HYSTERECTOMY WITH BILATERAL SALPINGO OOPHERECTOMY Right 10/02/2017   Procedure: XI ROBOTIC ASSISTED TOTAL HYSTERECTOMY WITH RIGHT SALPINGO OOPHORECTOMY;  Surgeon: Adolphus Birchwood, MD;  Location: [facility];  Service: Gynecology;  Laterality: Right;   ROBOTIC PELVIC AND PARA-AORTIC LYMPH NODE DISSECTION Bilateral 10/02/2017   Procedure: XI ROBOTIC PELVIC LYMPH NODE DISSECTION;  Surgeon: Adolphus Birchwood, MD;  Location: [facility];  Service: Gynecology;  Laterality: Bilateral;    Family History: Family History  Problem Relation Age of Onset   Hypertension Mother    Prostate cancer Father 38       d. 68   Diabetes Paternal Aunt    Heart attack Paternal Aunt    Heart attack Paternal Grandmother 81   Thyroid disease Other 51    Social History: Social History   Tobacco Use  Smoking Status Never  Smokeless Tobacco Never   Social History   Substance and Sexual Activity  Alcohol Use Never   Social History   Substance and Sexual Activity  Drug Use Never    Allergies: No Known Allergies  Medications: Current Outpatient Medications  Medication Sig Dispense Refill   baclofen (LIORESAL) 10 MG tablet Take 10 mg by mouth 3 (three) times daily as needed.     clotrimazole-betamethasone (LOTRISONE) cream Apply 1 application topically 2 (two) times daily. (Patient taking differently: Apply 1 application  topically  daily.) 30 g 0   DULoxetine (CYMBALTA) 20 MG capsule Take 20 mg by mouth every morning.     estradiol (CLIMARA) 0.05 mg/24hr patch Place 1 patch (0.05 mg total) onto the skin once a week. 12 patch 3   hyoscyamine (LEVSIN) 0.125 MG tablet Take 1 tablet (0.125 mg total) by mouth every 6 (six) hours as needed (dolor abdominal). 180 tablet 1   losartan (COZAAR) 50 MG tablet Take 50 mg by mouth daily.     meloxicam (MOBIC) 7.5 MG tablet Take 7.5 mg by mouth daily.     pantoprazole (PROTONIX) 40 MG tablet Take 1 tablet by mouth once daily 90 tablet 1   Pramox-PE-Glycerin-Petrolatum (HEMORRHOIDAL) 1-0.25-14.4-15 % CREA Place 1 application rectally daily as needed (Hemorrhoid).     No current facility-administered medications for this visit.    Review of Systems: GENERAL: negative for malaise, night sweats HEENT: No changes in hearing or vision, no nose bleeds or other nasal problems. NECK: Negative for lumps, goiter, pain and significant neck swelling RESPIRATORY: Negative for cough, wheezing CARDIOVASCULAR:  Negative for chest pain, leg swelling, palpitations, orthopnea GI: SEE HPI MUSCULOSKELETAL: Negative for joint pain or swelling, back pain, and muscle pain. SKIN: Negative for lesions, rash PSYCH: Negative for sleep disturbance, mood disorder and recent psychosocial stressors. HEMATOLOGY Negative for prolonged bleeding, bruising easily, and swollen nodes. ENDOCRINE: Negative for cold or heat intolerance, polyuria, polydipsia and goiter. NEURO: negative for tremor, gait imbalance, syncope and seizures. The remainder of the review of systems is noncontributory.   Physical Exam: BP 137/81 (BP Location: Left Arm, Patient Position: Sitting, Cuff Size: Large)   Pulse 80   Temp (!) 97.5 F (36.4 C) (Temporal)   Ht 5\' 3"  (1.6 m)   Wt 182 lb 6.4 oz (82.7 kg)   LMP 09/23/2017 (Exact Date)   BMI 32.31 kg/m  GENERAL: The patient is AO x3, in no acute distress. HEENT: Head is normocephalic and  atraumatic. EOMI are intact. Mouth is well hydrated and without lesions. NECK: Supple. No masses LUNGS: Clear to auscultation. No presence of rhonchi/wheezing/rales. Adequate chest expansion HEART: RRR, normal s1 and s2. ABDOMEN: tender in the R side of abdomen, no guarding, no peritoneal signs, and nondistended. BS +. No masses. EXTREMITIES: Without any cyanosis, clubbing, rash, lesions or edema. NEUROLOGIC: AOx3, no focal motor deficit. SKIN: no jaundice, no rashes  Imaging/Labs: as above  I personally reviewed and interpreted the available labs, imaging and endoscopic files.  Impression and Plan: Kristy Franklin is a 53 y.o. 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  The patient was recently found to have gastroparesis and gastric emptying study.  She is not taking any precipitating medications and does not have any history of diabetes.  She is presenting very typical symptoms of gastroparesis, which she has somewhat been able to manage with dietary modifications.  We discussed the different medication options for this disorder which include the use of Reglan, short-term course of macrolides or domperidone (off label use, not available in the [place]).  Patient would like to continue with multiple portions of food per day, which she will combine with a Mediterranean diet.  She is agreeable to taking Reglan as needed if she presents significant discomfort of the abdomen.  She will also check if she can get domperidone in [place] as her family lives there (I advised her to let me know if she is starting this medication, as she will need to stop Reglan at that time).  -Mediterranean diet -Eat 5-6 portions per day -Reglan 5 mg every 8 hours as needed  All questions were answered.      Katrinka Blazing, MD Gastroenterology and Hepatology [facility] Gastroenterology

## 2023-03-29 NOTE — Patient Instructions (Addendum)
Implente la dieta mediterranea Coma 5-6 porciones al dia Puede tomar el Reglan 5 mg  cada 8 horas a necesidad Pregunte en [place] por la domperidona 5 mg

## 2023-03-30 ENCOUNTER — Ambulatory Visit (INDEPENDENT_AMBULATORY_CARE_PROVIDER_SITE_OTHER): Payer: 59 | Admitting: Gastroenterology

## 2023-06-08 ENCOUNTER — Emergency Department (HOSPITAL_COMMUNITY)

## 2023-06-08 ENCOUNTER — Other Ambulatory Visit: Payer: Self-pay

## 2023-06-08 ENCOUNTER — Encounter (HOSPITAL_COMMUNITY): Payer: Self-pay | Admitting: Emergency Medicine

## 2023-06-08 ENCOUNTER — Emergency Department (HOSPITAL_COMMUNITY)
Admission: EM | Admit: 2023-06-08 | Discharge: 2023-06-08 | Disposition: A | Discharge status: Home / Self Care | Attending: Emergency Medicine | Admitting: Emergency Medicine

## 2023-06-08 DIAGNOSIS — R3121 Asymptomatic microscopic hematuria: Secondary | ICD-10-CM | POA: Insufficient documentation

## 2023-06-08 DIAGNOSIS — R109 Unspecified abdominal pain: Secondary | ICD-10-CM | POA: Diagnosis present

## 2023-06-08 DIAGNOSIS — Z8542 Personal history of malignant neoplasm of other parts of uterus: Secondary | ICD-10-CM | POA: Diagnosis not present

## 2023-06-08 DIAGNOSIS — K5732 Diverticulitis of large intestine without perforation or abscess without bleeding: Secondary | ICD-10-CM | POA: Diagnosis not present

## 2023-06-08 DIAGNOSIS — Z8543 Personal history of malignant neoplasm of ovary: Secondary | ICD-10-CM | POA: Diagnosis not present

## 2023-06-08 LAB — CBC
HCT: 43.2 % (ref 36.0–46.0)
Hemoglobin: 13.8 g/dL (ref 12.0–15.0)
MCH: 27.1 pg (ref 26.0–34.0)
MCHC: 31.9 g/dL (ref 30.0–36.0)
MCV: 84.9 fL (ref 80.0–100.0)
Platelets: 364 10*3/uL (ref 150–400)
RBC: 5.09 MIL/uL (ref 3.87–5.11)
RDW: 13.9 % (ref 11.5–15.5)
WBC: 9 10*3/uL (ref 4.0–10.5)
nRBC: 0 % (ref 0.0–0.2)

## 2023-06-08 LAB — LIPASE, BLOOD: Lipase: 23 U/L (ref 11–51)

## 2023-06-08 LAB — URINALYSIS, ROUTINE W REFLEX MICROSCOPIC
Bacteria, UA: NONE SEEN
Bilirubin Urine: NEGATIVE
Glucose, UA: NEGATIVE mg/dL
Ketones, ur: NEGATIVE mg/dL
Leukocytes,Ua: NEGATIVE
Nitrite: NEGATIVE
Protein, ur: NEGATIVE mg/dL
Specific Gravity, Urine: 1.019 (ref 1.005–1.030)
pH: 6 (ref 5.0–8.0)

## 2023-06-08 LAB — COMPREHENSIVE METABOLIC PANEL WITH GFR
ALT: 50 U/L — ABNORMAL HIGH (ref 0–44)
AST: 34 U/L (ref 15–41)
Albumin: 4.1 g/dL (ref 3.5–5.0)
Alkaline Phosphatase: 93 U/L (ref 38–126)
Anion gap: 11 (ref 5–15)
BUN: 13 mg/dL (ref 6–20)
CO2: 26 mmol/L (ref 22–32)
Calcium: 9.7 mg/dL (ref 8.9–10.3)
Chloride: 101 mmol/L (ref 98–111)
Creatinine, Ser: 0.62 mg/dL (ref 0.44–1.00)
GFR, Estimated: >60 mL/min (ref >60)
Glucose, Bld: 95 mg/dL (ref 70–99)
Potassium: 4.3 mmol/L (ref 3.5–5.1)
Sodium: 138 mmol/L (ref 135–145)
Total Bilirubin: 0.7 mg/dL (ref 0.0–1.2)
Total Protein: 7.9 g/dL (ref 6.5–8.1)

## 2023-06-08 MED ORDER — IOHEXOL 300 MG/ML  SOLN
100.0000 mL | Freq: Once | INTRAMUSCULAR | Status: AC | PRN
  Administered 2023-06-08: 100 mL via INTRAVENOUS

## 2023-06-08 MED ORDER — ONDANSETRON HCL 4 MG/2ML IJ SOLN
4.0000 mg | Freq: Once | INTRAMUSCULAR | Status: AC
Start: 2023-06-08 — End: 2023-06-08
  Administered 2023-06-08: 4 mg via INTRAVENOUS
  Filled 2023-06-08: qty 2

## 2023-06-08 MED ORDER — AMOXICILLIN-POT CLAVULANATE 875-125 MG PO TABS
1.0000 | ORAL_TABLET | Freq: Once | ORAL | Status: AC
  Administered 2023-06-08: 1 via ORAL
  Filled 2023-06-08: qty 1

## 2023-06-08 MED ORDER — KETOROLAC TROMETHAMINE 15 MG/ML IJ SOLN
15.0000 mg | Freq: Once | INTRAMUSCULAR | Status: AC
  Administered 2023-06-08: 15 mg via INTRAVENOUS
  Filled 2023-06-08: qty 1

## 2023-06-08 MED ORDER — AMOXICILLIN-POT CLAVULANATE 875-125 MG PO TABS: 1.0000 | ORAL_TABLET | Freq: Two times a day (BID) | ORAL | 0 refills | Status: DC

## 2023-06-08 NOTE — ED Triage Notes (Signed)
 Pt ambulatory to triage with c/o lower abdominal pain for last 4 days.  States saw her MD today and was told she has blood in her urine and was sent here for further evaluation.  Pt also reports constipation and not passing gas for last several days. Pt reports urinary frequency.

## 2023-06-08 NOTE — ED Provider Notes (Signed)
 [place] EMERGENCY DEPARTMENT AT [facility] Provider Note   CSN: [id number] Arrival date & time: 06/08/23  1032     History  Chief Complaint  Patient presents with   Abdominal Pain   Dysuria    Kristy Franklin is a 53 y.o. female.  She has PMH of stage III ovarian cancer in remission.  Treatment was approximately 5 years ago, she also had concurrent stage I uterine cancer.  She presents to ER today for evaluation of lower abdominal pain worse on the left but radiates across to the right lower abdomen x 4 days.  She is also had 2 strain to pass her bowels since this started, states the pain gets worse after a bowel movement.  Denies any blood in her stool.  She tried milk of magnesia which did help her have a bowel movement again this made the pain worse.  She states she came to the ER today because she went to her PCP for this pain and they found blood in her urine and advised further workup in the ER.  She states she was treated for UTI about a month ago was having similar pain but states she was also having back pain at that time.  Abdominal Pain Associated symptoms: dysuria   Dysuria Associated symptoms: abdominal pain        Home Medications Prior to Admission medications   Medication Sig Start Date End Date Taking? Authorizing Provider  amoxicillin-clavulanate (AUGMENTIN) 875-125 MG tablet Take 1 tablet by mouth every 12 (twelve) hours. 06/08/23  Yes Swain Acree A, PA-C  baclofen (LIORESAL) 10 MG tablet Take 10 mg by mouth 3 (three) times daily as needed. 12/13/20   [provider]  clotrimazole-betamethasone (LOTRISONE) cream Apply 1 application topically 2 (two) times daily. Patient taking differently: Apply 1 application  topically daily. 07/20/20   Gray, Sarah E, NP  DULoxetine (CYMBALTA) 20 MG capsule Take 20 mg by mouth every morning. 01/04/21   [provider]  estradiol (CLIMARA) 0.05 mg/24hr patch Place 1 patch (0.05 mg total) onto  the skin once a week. 01/10/23   Abdul Hodgkin, MD  hyoscyamine (LEVSIN) 0.125 MG tablet Take 1 tablet (0.125 mg total) by mouth every 6 (six) hours as needed (dolor abdominal). 12/22/22   Urban Garden, MD  losartan (COZAAR) 50 MG tablet Take 50 mg by mouth daily. 07/22/22   [provider]  meloxicam (MOBIC) 7.5 MG tablet Take 7.5 mg by mouth daily.    [provider]  metoCLOPramide (REGLAN) 5 MG tablet Take 1 tablet (5 mg total) by mouth every 8 (eight) hours as needed (sensacion de llenura). 03/29/23   Castaneda Mayorga, Daniel, MD  pantoprazole (PROTONIX) 40 MG tablet Take 1 tablet by mouth once daily 04/18/21   Castaneda Mayorga, Bearl Limes, MD  Pramox-PE-Glycerin-Petrolatum (HEMORRHOIDAL) 1-0.25-14.4-15 % CREA Place 1 application rectally daily as needed (Hemorrhoid).    [provider]      Allergies    Patient has no known allergies.    Review of Systems   Review of Systems  Gastrointestinal:  Positive for abdominal pain.  Genitourinary:  Positive for dysuria.    Physical Exam Updated Vital Signs BP (!) 150/58 (BP Location: Left Arm)   Pulse (!) 58   Temp (!) 97.5 F (36.4 C) (Oral)   Resp 16   Ht 5\' 3"  (1.6 m)   Wt 80.7 kg   LMP 09/23/2017 (Exact Date)   SpO2 97%   BMI 31.53 kg/m  Physical Exam Vitals and nursing note reviewed.  Constitutional:      General: She is not in acute distress.    Appearance: She is well-developed.  HENT:     Head: Normocephalic and atraumatic.  Eyes:     Conjunctiva/sclera: Conjunctivae normal.  Cardiovascular:     Rate and Rhythm: Normal rate and regular rhythm.     Heart sounds: No murmur heard. Pulmonary:     Effort: Pulmonary effort is normal. No respiratory distress.     Breath sounds: Normal breath sounds.  Abdominal:     Palpations: Abdomen is soft.     Tenderness: There is abdominal tenderness in the suprapubic area and left lower quadrant. There is no guarding or rebound.     Hernia:  No hernia is present.  Musculoskeletal:        General: No swelling.     Cervical back: Neck supple.  Skin:    General: Skin is warm and dry.     Capillary Refill: Capillary refill takes less than 2 seconds.  Neurological:     General: No focal deficit present.     Mental Status: She is alert.  Psychiatric:        Mood and Affect: Mood normal.     ED Results / Procedures / Treatments   Labs (all labs ordered are listed, but only abnormal results are displayed) Labs Reviewed  COMPREHENSIVE METABOLIC PANEL WITH GFR - Abnormal; Notable for the following components:      Result Value   ALT 50 (*)    All other components within normal limits  URINALYSIS, ROUTINE W REFLEX MICROSCOPIC - Abnormal; Notable for the following components:   Hgb urine dipstick MODERATE (*)    All other components within normal limits  LIPASE, BLOOD  CBC    EKG None  Radiology CT ABDOMEN PELVIS W CONTRAST Result Date: 06/08/2023 CLINICAL DATA:  Abdominal pain, acute, nonlocalized lower abdominal pain, constipation, hx ovarian cancer in remission. * Tracking Code: BO * EXAM: CT ABDOMEN AND PELVIS WITH CONTRAST TECHNIQUE: Multidetector CT imaging of the abdomen and pelvis was performed using the standard protocol following bolus administration of intravenous contrast. RADIATION DOSE REDUCTION: This exam was performed according to the departmental dose-optimization program which includes automated exposure control, adjustment of the mA and/or kV according to patient size and/or use of iterative reconstruction technique. CONTRAST:  100mL OMNIPAQUE IOHEXOL 300 MG/ML  SOLN COMPARISON:  CT scan abdomen and pelvis 06/22/2020. FINDINGS: Lower chest: There are patchy atelectatic changes in the visualized lung bases. No overt consolidation. No pleural effusion. The heart is normal in size. No pericardial effusion. Hepatobiliary: The liver is normal in size. Non-cirrhotic configuration. No suspicious mass. These is mild  diffuse hepatic steatosis. No intrahepatic or extrahepatic bile duct dilation. Gallbladder is surgically absent. Pancreas: Unremarkable. No pancreatic ductal dilatation or surrounding inflammatory changes. Spleen: Within normal limits. No focal lesion. Adrenals/Urinary Tract: Adrenal glands are unremarkable. No suspicious renal mass. No hydronephrosis. No renal or ureteric calculi. Urinary bladder is under distended, precluding optimal assessment. However, no large mass or stones identified. No perivesical fat stranding. Stomach/Bowel: There is a tiny sliding hiatal hernia. There is short segment of colon (approximately 3-4 cm), at the level of junction of descending colon and proximal sigmoid colon, exhibiting mild irregular wall thickening and pericolonic fat stranding on the background of several diverticula, compatible with acute uncomplicated diverticulitis. No walled off abscess or loculated collection. No pneumoperitoneum. No disproportionate dilation of small or large bowel loops  to suggest bowel obstruction. Unremarkable appendix. Vascular/Lymphatic: No ascites or pneumoperitoneum. No abdominal or pelvic lymphadenopathy, by size criteria. No aneurysmal dilation of the major abdominal arteries. Reproductive: The uterus is surgically absent. No large adnexal mass. Other: There is a tiny fat containing umbilical hernia. The soft tissues and abdominal wall are otherwise unremarkable. Musculoskeletal: No suspicious osseous lesions. There are mild multilevel degenerative changes in the visualized spine. IMPRESSION: 1. Findings compatible with acute uncomplicated diverticulitis of the junction of descending colon and proximal sigmoid colon. No walled off abscess or loculated collection. No pneumoperitoneum. 2. No metastatic ovarian carcinoma seen within the abdomen or pelvis. 3. Multiple other nonacute observations, as described above. Electronically Signed   By: Beula Brunswick M.D.   On: 06/08/2023 13:10     Procedures Procedures    Medications Ordered in ED Medications  ondansetron (ZOFRAN) injection 4 mg (4 mg Intravenous Given 06/08/23 1203)  iohexol (OMNIPAQUE) 300 MG/ML solution 100 mL (100 mLs Intravenous Contrast Given 06/08/23 1217)  amoxicillin-clavulanate (AUGMENTIN) 875-125 MG per tablet 1 tablet (1 tablet Oral Given 06/08/23 1332)  ketorolac (TORADOL) 15 MG/ML injection 15 mg (15 mg Intravenous Given 06/08/23 1332)    ED Course/ Medical Decision Making/ A&P                                 Medical Decision Making This patient presents to the ED for concern of left lower quadrant abdominal pain, this involves an extensive number of treatment options, and is a complaint that carries with it a high risk of complications and morbidity.  The differential diagnosis includes diverticulitis, colitis, metastases, UTI, ureterolithiasis, other   Co morbidities that complicate the patient evaluation :   Ovarian cancer status post surgery and chemotherapy   Additional history obtained:  Additional history obtained from EMR External records from outside source obtained and reviewed including prior notes and labs   Lab Tests:  I Ordered, and personally interpreted labs.  The pertinent results include: CBC shows no leukocytosis and no anemia, lipase is normal, CMP is unremarkable for slightly elevated ALT, UA shows 16 red blood cells but no other abnormalities   Imaging Studies ordered:  I ordered imaging studies including CT abdomen pelvis which shows diverticulitis without abscess or perforation I independently visualized and interpreted imaging within scope of identifying emergent findings  I agree with the radiologist interpretation     Problem List / ED Course / Critical interventions / Medication management  Diverticulitis-patient is having several days of left lower quadrant pain, given her history of ovarian cancer CT was ordered despite fairly reassuring labs and exam,  shows diverticulitis without complication and no recurrence of her malignancy.  Discussed with patient she is feeling better after the Toradol for pain, she is agreeable with following up with GI for her diverticulitis, understands to take the antibiotics and was given strict return precautions.  I have reviewed the patients home medicines and have made adjustments as needed     Test / Admission - Considered:  Considered the need for admission, patient has no fever or tachycardia, no leukocytosis and no signs of rupture or abscess on her CT, outpatient treatment of her diverticulitis is appropriate at this time.  She is feeling better and has reassuring exam on repeat exam, patient is agreeable with outpatient follow-up with GI    Amount and/or Complexity of Data Reviewed Labs: ordered. Radiology: ordered.  Risk Prescription drug management.  Final Clinical Impression(s) / ED Diagnoses Final diagnoses:  Asymptomatic microscopic hematuria  Diverticulitis large intestine w/o perforation or abscess w/o bleeding    Rx / DC Orders ED Discharge Orders          Ordered    amoxicillin-clavulanate (AUGMENTIN) 875-125 MG tablet  Every 12 hours        06/08/23 1357              Aimee Houseman, PA-C 06/09/23 0906    Tonya Fredrickson, MD 06/09/23 1045

## 2023-06-08 NOTE — ED Notes (Signed)
 ED Provider at bedside.

## 2023-06-08 NOTE — Discharge Instructions (Addendum)
 You were seen in the emergency room today for abdominal pain, you have diverticulitis.  You are treating you with antibiotics.  Follow-up with the GI doctor. He also have blood in your urine but no signs of urinary tract infection.  Follow-up with your PCP and/your urology for further evaluation.

## 2023-07-26 NOTE — Progress Notes (Unsigned)
 Name: Kristy Franklin DOB: [birth year] MRN: [id number]  History of Present Illness: Kristy Franklin is a 53 y.o. female who presents today as a new patient at [facility]. History includes: 1. 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 - BRCA negative.  - 10/02/2017: Robotic-assisted laparoscopic total hysterectomy with bilateral salpingo-oophorectomy, omentectomy, bilateral pelvic lymphadenectomy. - Completed chemotherapy with 6 cycles adjuvant carboplatin  and paclitaxel  in December 2019.  She reports chief complaint of microscopic hematuria. > 03/30/2023:  - Urine culture negative.  > 03/30/2023:  - UA showed trace blood; otherwise unremarkable. - Treated with Nitrofurantoin  for possible UTI. - No urine culture sent.  > 06/08/2023: - Seen in ER for LLQ abdominal pain. - CBC showed no leukocytosis or anemia. - CMP with normal renal function (creatinine 0.62, GFR >60). - UA showed 6-10 RBC/hpf; otherwise unremarkable. - CT abdomen/pelvis with contrast showed diverticulitis without abscess or perforation; no evidence of recurrent malignancy. No GU stones, masses, or hydronephrosis. - Discharged with Augmentin  prescription.  Today: She denies increased urinary urgency, frequency, dysuria, gross hematuria, hesitancy, or straining to void. Reports terminal dribbling and mild sensations of incomplete emptying.   She denies flank pain or abdominal pain. Reports chronic midline low back pain. She denies fevers, nausea, or vomiting. Reports "I feel tired."  She denies prior history of gross hematuria.  She denies history of kidney stones.  She denies history of GU malignancy or pelvic radiation.  She denies history of autoimmune disease. She denies history of sickle cell disease or sickle cell trait. She denies history of smoking. She denies taking anticoagulants.  She reports vaginal dryness and  mild itching. Denies vaginal pain, bleeding, or abnormal discharge. She reports being sexually active and reports dyspareunia, which is located at the introitus / "outside" of the vagina. She denies vaginal bulge sensation.   She was previously using Estradiol  patch for hot flashes. She recently elected to discontinue that at the recommendation of her PCP based on concerns for risk of cancer recurrence. She reports she was previously using a topical vaginal estrogen cream prior to starting the Estradiol  patch.   She was seen by her GYN/ONC provider (Dr. Gaylin Ke) on 01/10/2023. Per that note the plan was: "Continue annual surveillance. Appropriate to be followed by a generalist". Patient is planning to follow up with routine gynecology in November 2025; denies being established with a regular GYN provider yet.     Medications: Current Outpatient Medications  Medication Sig Dispense Refill   baclofen (LIORESAL) 10 MG tablet Take 10 mg by mouth 3 (three) times daily as needed.     clotrimazole -betamethasone  (LOTRISONE ) cream Apply 1 application topically 2 (two) times daily. (Patient taking differently: Apply 1 application  topically daily.) 30 g 0   DULoxetine (CYMBALTA) 20 MG capsule Take 20 mg by mouth every morning.     hyoscyamine  (LEVSIN) 0.125 MG tablet Take 1 tablet (0.125 mg total) by mouth every 6 (six) hours as needed (dolor abdominal). 180 tablet 1   losartan (COZAAR) 50 MG tablet Take 50 mg by mouth daily.     meloxicam (MOBIC) 7.5 MG tablet Take 7.5 mg by mouth daily.     metoCLOPramide  (REGLAN ) 5 MG tablet Take 1 tablet (5 mg total) by mouth every 8 (eight) hours as needed (sensacion de llenura). 120 tablet 2   pantoprazole  (PROTONIX ) 40 MG tablet Take 1 tablet by mouth once daily 90 tablet 1   Pramox-PE-Glycerin-Petrolatum (HEMORRHOIDAL)  1-0.25-14.4-15 % CREA Place 1 application rectally daily as needed (Hemorrhoid).     No current facility-administered medications for this  visit.    Allergies: No Known Allergies  Past Medical History:  Diagnosis Date   Abdominal pain    Right mid only with bowel movements   Blood in urine    Change in bowel movement    more green color   Diabetes mellitus without complication (HCC)    Essential hypertension, benign 11/12/2018   Family history of prostate cancer    GERD (gastroesophageal reflux disease)    Headache    after menstrual cycle every month   History of gallstones    Hot flashes due to surgical menopause 12/17/2018   Hypertension    Neck pain on right side    pulsating   Numbness and tingling of right leg    Obesity    Obesity (BMI 30.0-34.9) 12/17/2018   Ovarian cancer, left (HCC)    PPD positive    Skin rash    Chest, forehead, left arm   Past Surgical History:  Procedure Laterality Date   BIOPSY  04/24/2019   Procedure: BIOPSY;  Surgeon: Ruby Corporal, MD;  [facility];  Service: Endoscopy;;  antrum   BIOPSY  09/24/2020   Procedure: BIOPSY;  Surgeon: Urban Garden, MD;  [facility];  Service: Gastroenterology;;   CESAREAN SECTION     x2   CHOLECYSTECTOMY     20 years ago   COLONOSCOPY N/A 09/12/2018   Procedure: COLONOSCOPY;  Surgeon: Ruby Corporal, MD;  [facility];  Service: Endoscopy;  Laterality: N/A;  200   ESOPHAGOGASTRODUODENOSCOPY N/A 04/24/2019   Procedure: ESOPHAGOGASTRODUODENOSCOPY (EGD);  Surgeon: Ruby Corporal, MD;  [facility];  Service: Endoscopy;  Laterality: N/A;  125   ESOPHAGOGASTRODUODENOSCOPY (EGD) WITH PROPOFOL  N/A 09/24/2020   Procedure: ESOPHAGOGASTRODUODENOSCOPY (EGD) WITH PROPOFOL ;  Surgeon: Urban Garden, MD;  [facility];  Service: Gastroenterology;  Laterality: N/A;  1:00   IR IMAGING GUIDED PORT INSERTION  10/24/2017   IR REMOVAL TUN ACCESS W/ PORT W/O FL MOD SED  07/22/2021   OMENTECTOMY N/A 10/02/2017   Procedure: OMENTECTOMY;  Surgeon: Alphonso Aschoff, MD;  Location: [facility];   Service: Gynecology;  Laterality: N/A;   OTHER SURGICAL HISTORY  07/2017   Left ovary removed   POLYPECTOMY  04/24/2019   Procedure: POLYPECTOMY;  Surgeon: Ruby Corporal, MD;  [facility];  Service: Endoscopy;;  gastric   ROBOTIC ASSISTED TOTAL HYSTERECTOMY WITH BILATERAL SALPINGO OOPHERECTOMY Right 10/02/2017   Procedure: XI ROBOTIC ASSISTED TOTAL HYSTERECTOMY WITH RIGHT SALPINGO OOPHORECTOMY;  Surgeon: Alphonso Aschoff, MD;  Location: [facility];  Service: Gynecology;  Laterality: Right;   ROBOTIC PELVIC AND PARA-AORTIC LYMPH NODE DISSECTION Bilateral 10/02/2017   Procedure: XI ROBOTIC PELVIC LYMPH NODE DISSECTION;  Surgeon: Alphonso Aschoff, MD;  Location: [facility];  Service: Gynecology;  Laterality: Bilateral;   Family History  Problem Relation Age of Onset   Hypertension Mother    Prostate cancer Father 20       d. 23   Diabetes Paternal Aunt    Heart attack Paternal Aunt    Heart attack Paternal Grandmother 36   Thyroid disease Other 41   Social History   Socioeconomic History   Marital status: Married    Spouse name: Salvator   Number of children: 2   Years of education: Not on file   Highest education level: Not  on file  Occupational History   Occupation: unemployed  Tobacco Use   Smoking status: Never   Smokeless tobacco: Never  Vaping Use   Vaping status: Never Used  Substance and Sexual Activity   Alcohol use: Never   Drug use: Never   Sexual activity: Not Currently    Comment: calendar  Other Topics Concern   Not on file  Social History Narrative   Married 26 years.Lives with husband and daughter.Homemaker.   Social Drivers of Corporate investment banker Strain: Not on file  Food Insecurity: Not on file  Transportation Needs: Not on file  Physical Activity: Not on file  Stress: Not on file  Social Connections: Not on file  Intimate Partner Violence: Not on file    SUBJECTIVE  Review of Systems Constitutional: Patient denies any unintentional weight  loss or change in strength lntegumentary: Patient denies any rashes or pruritus Cardiovascular: Patient denies chest pain or syncope Respiratory: Patient denies shortness of breath Gastrointestinal: Patient denies nausea, vomiting, constipation  Musculoskeletal: Patient denies muscle cramps or weakness Neurologic: Patient denies convulsions or seizures Allergic/Immunologic: Patient denies recent allergic reaction(s) Hematologic/Lymphatic: Patient denies bleeding tendencies Endocrine: Patient denies heat/cold intolerance  GU: As per HPI.  OBJECTIVE Vitals:   07/27/23 1105  BP: (!) 153/84  Pulse: (!) 101   There is no height or weight on file to calculate BMI.  Physical Examination Constitutional: No obvious distress; patient is non-toxic appearing  Cardiovascular: No visible lower extremity edema.  Respiratory: The patient does not have audible wheezing/stridor; respirations do not appear labored  Gastrointestinal: Abdomen non-distended Musculoskeletal: Normal ROM of UEs  Skin: No obvious rashes/open sores  Neurologic: CN 2-12 grossly intact Psychiatric: Answered questions appropriately with normal affect  Hematologic/Lymphatic/Immunologic: No obvious bruises or sites of spontaneous bleeding  Urine microscopy: 11-30 RBC/hpf, otherwise unremarkable  ASSESSMENT Microscopic hematuria - Plan: Urinalysis, Routine w reflex microscopic  Malignant neoplasm of left ovary (HCC) - Plan: Ambulatory referral to Gynecology  Endometrial cancer (HCC) - Plan: Ambulatory referral to Gynecology  Atrophic vaginitis  For asymptomatic microscopic hematuria we discussed possible etiologies including but not limited to: vigorous exercise, sexual activity, stone, trauma, blood thinner use, urinary tract infection, urethral irritation secondary to vaginal atrophy, chronic kidney disease, glomerulonephropathy, malignancy.   No acute findings on recent CT abdomen/pelvis w/ contrast. Advised  cystoscopy for further evaluation based on her risk factors, however suspect that her microscopic hematuria is most likely due to her symptomatic vaginal atrophy rather than malignancy.   For her symptomatic vaginal atrophy we discussed option to restart topical vaginal estrogen cream use. She was informed that studies have demonstrated negligible systemic absorption of low-dose intravaginal estrogen cream therefore there is minimal risk for cancer development or recurrence. We agreed to consult her GYN/ONC provider (Dr. Gaylin Ke) prior to restarting topical vaginal estrogen cream for symptomatic vaginal atrophy.  GYN referral placed per patient request.  Pt verbalized understanding and agreement. All questions were answered.   PLAN Advised the following: 1. Consult note sent to GYN/ONC provider (Dr. Gaylin Ke) re: restarting topical vaginal estrogen cream for symptomatic vaginal atrophy. 2. GYN referral placed. 3. Return for 1st available cystoscopy with any urology MD.  Orders Placed This Encounter  Procedures   Urinalysis, Routine w reflex microscopic   Ambulatory referral to Gynecology    Referral Priority:   Routine    Referral Type:   Consultation    Referral Reason:   Specialty Services Required    Requested Specialty:  Gynecology    Number of Visits Requested:   1    It has been explained that the patient is to follow regularly with their PCP in addition to all other providers involved in their care and to follow instructions provided by these respective offices. Patient advised to contact urology clinic if any urologic-pertaining questions, concerns, new symptoms or problems arise in the interim period.  There are no Patient Instructions on file for this visit.  Electronically signed by:  Lauretta Ponto, MSN, FNP-C, CUNP 07/27/2023 12:09 PM

## 2023-07-27 ENCOUNTER — Ambulatory Visit (INDEPENDENT_AMBULATORY_CARE_PROVIDER_SITE_OTHER): Admitting: Urology

## 2023-07-27 ENCOUNTER — Encounter: Payer: Self-pay | Admitting: Urology

## 2023-07-27 VITALS — BP 153/84 | HR 101

## 2023-07-27 DIAGNOSIS — N952 Postmenopausal atrophic vaginitis: Secondary | ICD-10-CM | POA: Insufficient documentation

## 2023-07-27 DIAGNOSIS — R3129 Other microscopic hematuria: Secondary | ICD-10-CM

## 2023-07-27 DIAGNOSIS — C541 Malignant neoplasm of endometrium: Secondary | ICD-10-CM

## 2023-07-27 DIAGNOSIS — C562 Malignant neoplasm of left ovary: Secondary | ICD-10-CM

## 2023-07-27 LAB — URINALYSIS, ROUTINE W REFLEX MICROSCOPIC
Bilirubin, UA: NEGATIVE
Glucose, UA: NEGATIVE
Ketones, UA: NEGATIVE
Leukocytes,UA: NEGATIVE
Nitrite, UA: NEGATIVE
Protein,UA: NEGATIVE
Specific Gravity, UA: 1.02 (ref 1.005–1.030)
Urobilinogen, Ur: 1 mg/dL (ref 0.2–1.0)
pH, UA: 6.5 (ref 5.0–7.5)

## 2023-07-27 LAB — MICROSCOPIC EXAMINATION: Bacteria, UA: NONE SEEN

## 2023-08-09 ENCOUNTER — Encounter (INDEPENDENT_AMBULATORY_CARE_PROVIDER_SITE_OTHER): Payer: Self-pay | Admitting: Gastroenterology

## 2023-09-03 NOTE — Progress Notes (Signed)
 H&P    History of Present Illness: Kristy Franklin is a 53 y.o. year old female here for cystoscopy.  She has been seen for microscopic hematuria.  She does have a history of endometrial and ovarian carcinoma.  She has had robotic assisted TAH/BSO.  CT abdomen pelvis performed in April of this year for abdominal pain.  This revealed findings consistent with diverticulitis.  No abnormalities of the urinary tract.    Past Medical History:  Diagnosis Date   Abdominal pain    Right mid only with bowel movements   Blood in urine    Change in bowel movement    more green color   Diabetes mellitus without complication (HCC)    Essential hypertension, benign 11/12/2018   Family history of prostate cancer    GERD (gastroesophageal reflux disease)    Headache    after menstrual cycle every month   History of gallstones    Hot flashes due to surgical menopause 12/17/2018   Hypertension    Neck pain on right side    pulsating   Numbness and tingling of right leg    Obesity    Obesity (BMI 30.0-34.9) 12/17/2018   Ovarian cancer, left (HCC)    PPD positive    Skin rash    Chest, forehead, left arm    Past Surgical History:  Procedure Laterality Date   BIOPSY  04/24/2019   Procedure: BIOPSY;  Surgeon: Golda Claudis PENNER, MD;  [facility];  Service: Endoscopy;;  antrum   BIOPSY  09/24/2020   Procedure: BIOPSY;  Surgeon: Eartha Angelia Sieving, MD;  [facility];  Service: Gastroenterology;;   CESAREAN SECTION     x2   CHOLECYSTECTOMY     20 years ago   COLONOSCOPY N/A 09/12/2018   Procedure: COLONOSCOPY;  Surgeon: Golda Claudis PENNER, MD;  [facility];  Service: Endoscopy;  Laterality: N/A;  200   ESOPHAGOGASTRODUODENOSCOPY N/A 04/24/2019   Procedure: ESOPHAGOGASTRODUODENOSCOPY (EGD);  Surgeon: Golda Claudis PENNER, MD;  [facility];  Service: Endoscopy;  Laterality: N/A;  125   ESOPHAGOGASTRODUODENOSCOPY (EGD) WITH PROPOFOL  N/A 09/24/2020    Procedure: ESOPHAGOGASTRODUODENOSCOPY (EGD) WITH PROPOFOL ;  Surgeon: Eartha Angelia Sieving, MD;  [facility];  Service: Gastroenterology;  Laterality: N/A;  1:00   IR IMAGING GUIDED PORT INSERTION  10/24/2017   IR REMOVAL TUN ACCESS W/ PORT W/O FL MOD SED  07/22/2021   OMENTECTOMY N/A 10/02/2017   Procedure: OMENTECTOMY;  Surgeon: Eloy Herring, MD;  [facility]: [facility];  Service: Gynecology;  Laterality: N/A;   OTHER SURGICAL HISTORY  07/2017   Left ovary removed   POLYPECTOMY  04/24/2019   Procedure: POLYPECTOMY;  Surgeon: Golda Claudis PENNER, MD;  [facility];  Service: Endoscopy;;  gastric   ROBOTIC ASSISTED TOTAL HYSTERECTOMY WITH BILATERAL SALPINGO OOPHERECTOMY Right 10/02/2017   Procedure: XI ROBOTIC ASSISTED TOTAL HYSTERECTOMY WITH RIGHT SALPINGO OOPHORECTOMY;  Surgeon: Eloy Herring, MD;  Location: [facility];  Service: Gynecology;  Laterality: Right;   ROBOTIC PELVIC AND PARA-AORTIC LYMPH NODE DISSECTION Bilateral 10/02/2017   Procedure: XI ROBOTIC PELVIC LYMPH NODE DISSECTION;  Surgeon: Eloy Herring, MD;  Location: [facility];  Service: Gynecology;  Laterality: Bilateral;    Home Medications:  (Not in a hospital admission)   Allergies: No Known Allergies  Family History  Problem Relation Age of Onset   Hypertension Mother    Prostate cancer Father 71       d. 103   Diabetes  Paternal Aunt    Heart attack Paternal Aunt    Heart attack Paternal Grandmother 60   Thyroid disease Other 28    Social History:  reports that she has never smoked. She has never used smokeless tobacco. She reports that she does not drink alcohol and does not use drugs.  ROS: A complete review of systems was performed.  All systems are negative except for pertinent findings as noted.  Physical Exam:  Vital signs in last 24 hours: @VSRANGES @ General:  Alert and oriented, No acute distress HEENT: Normocephalic, atraumatic Neck: No JVD or lymphadenopathy Cardiovascular: Regular rate  Lungs:  Normal inspiratory/expiratory excursion Abdomen: Soft, nontender, nondistended, no abdominal masses Back: No CVA tenderness Extremities: No edema Neurologic: Grossly intact  I have reviewed prior pt notes  I have reviewed notes from referring/previous physicians  I have reviewed urinalysis results  I have independently reviewed prior imaging  I have reviewed prior urine culture   Indication: microhematuria  After informed consent and discussion of the procedure and its risks, pt was positioned and prepped in the standard fashion.  Cystoscopy was performed with a flexible cystoscope.   Findings:  Urethra:normal Ureteral orifices:normal configuration/location Bladder:nORMAL  Impression/Assessment:  Microscopic hematuria w/ nml cysto/CT  Plan:  -Pt reassured  -OV prn  Garnette HERO Magon Croson 09/03/2023, 6:49 AM  Garnette HERO. Fedora Knisely MD

## 2023-09-04 ENCOUNTER — Encounter: Payer: Self-pay | Admitting: Urology

## 2023-09-04 ENCOUNTER — Ambulatory Visit: Admitting: Urology

## 2023-09-04 VITALS — BP 135/85 | HR 74

## 2023-09-04 DIAGNOSIS — R3129 Other microscopic hematuria: Secondary | ICD-10-CM | POA: Diagnosis not present

## 2023-09-04 LAB — URINALYSIS, ROUTINE W REFLEX MICROSCOPIC
Bilirubin, UA: NEGATIVE
Glucose, UA: NEGATIVE
Ketones, UA: NEGATIVE
Leukocytes,UA: NEGATIVE
Nitrite, UA: NEGATIVE
Protein,UA: NEGATIVE
Specific Gravity, UA: 1.015 (ref 1.005–1.030)
Urobilinogen, Ur: 1 mg/dL (ref 0.2–1.0)
pH, UA: 7.5 (ref 5.0–7.5)

## 2023-09-04 LAB — MICROSCOPIC EXAMINATION
Bacteria, UA: NONE SEEN
WBC, UA: NONE SEEN /HPF (ref 0–5)

## 2023-09-04 MED ORDER — CIPROFLOXACIN HCL 500 MG PO TABS
500.0000 mg | ORAL_TABLET | Freq: Once | ORAL | Status: AC
  Administered 2023-09-04: 500 mg via ORAL

## 2023-09-07 ENCOUNTER — Ambulatory Visit: Payer: Self-pay | Admitting: Radiology

## 2023-09-07 ENCOUNTER — Encounter: Payer: Self-pay | Admitting: Radiology

## 2023-09-07 VITALS — BP 138/72 | HR 69 | Ht 62.0 in | Wt 180.8 lb

## 2023-09-07 DIAGNOSIS — N898 Other specified noninflammatory disorders of vagina: Secondary | ICD-10-CM

## 2023-09-07 DIAGNOSIS — Z1331 Encounter for screening for depression: Secondary | ICD-10-CM

## 2023-09-07 DIAGNOSIS — Z1231 Encounter for screening mammogram for malignant neoplasm of breast: Secondary | ICD-10-CM

## 2023-09-07 DIAGNOSIS — Z01419 Encounter for gynecological examination (general) (routine) without abnormal findings: Secondary | ICD-10-CM

## 2023-09-07 MED ORDER — ESTRADIOL 0.1 MG/GM VA CREA: 1.0000 g | TOPICAL_CREAM | VAGINAL | 3 refills | Status: AC

## 2023-09-07 NOTE — Progress Notes (Signed)
   Kristy Franklin October 29, 1970 [id number]   History:  53 y.o. 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 10/02/2017: Robotic-assisted laparoscopic total hysterectomy with bilateral salpingo-oophorectomy, omentectomy, bilateral pelvic lymphadenectomy. Completed chemotherapy with 6 cycles adjuvant carboplatin  and paclitaxel  in December 2019. BRCA negative. C/o vaginal dryness. Was worked up by urology for hematuria, testing negative, likely from vaginal atrophy they told her. Was on an estrogen patch, stopped 6 months ago when her PCP would not refill.   Gynecologic History Hysterectomy: 2019  Sexually active: no  Health Maintenance Last Pap: 11/24 per pt with Dr Leonce Last mammogram: 2023 per pt in [place], normal Last colonoscopy: 2020     09/07/2023   10:38 AM 04/22/2020    5:28 PM  Depression screen PHQ 2/9  Decreased Interest 0 0  Down, Depressed, Hopeless 0 0  PHQ - 2 Score 0 0     Past medical history, past surgical history, family history and social history were all reviewed and documented in the EPIC chart.  ROS:  A ROS was performed and pertinent positives and negatives are included.  Exam:  Vitals:   09/07/23 1028  BP: 138/72  Pulse: 69  SpO2: 97%  Weight: 180 lb 12.8 oz (82 kg)  Height: 5' 2 (1.575 m)   Body mass index is 33.07 kg/m.  General appearance:  Normal Thyroid:  Symmetrical, normal in size, without palpable masses or nodularity. Respiratory  Auscultation:  Clear without wheezing or rhonchi Cardiovascular  Auscultation:  Regular rate, without rubs, murmurs or gallops  Edema/varicosities:  Not grossly evident Abdominal  Soft,nontender, without masses, guarding or rebound.  Liver/spleen:  No organomegaly noted  Hernia:  None appreciated  Skin  Inspection:  Grossly normal Breasts: Examined lying and sitting.   Right: Without masses,  retractions, nipple discharge or axillary adenopathy.   Left: Without masses, retractions, nipple discharge or axillary adenopathy. Genitourinary   Inguinal/mons:  Normal without inguinal adenopathy  External genitalia:  Normal appearing vulva with no masses, tenderness, or lesions  BUS/Urethra/Skene's glands:  Normal  Vagina:  Normal appearing with normal color and discharge, no lesions. Atrophy moderate  Cervix:  absent  Uterus:  absent  Adnexa/parametria:  ovarian absent, no masses palpated  Anus and perineum: Normal   Kristy Franklin, CMA present for exam  Assessment/Plan:   1. Well woman exam with routine gynecological exam (Primary) Will obtain records from pap  2. Vaginal dryness - estradiol  (ESTRACE  VAGINAL) 0.1 MG/GM vaginal cream; Place 1 g vaginally 3 (three) times a week.  Dispense: 42.5 g; Refill: 3  3. Screening mammogram for breast cancer - MM 3D SCREENING MAMMOGRAM BILATERAL BREAST; Future  4. Depression screening negative  Return in 1 year for annual or sooner prn.  Kristy Franklin B WHNP-BC 11:09 AM 09/07/2023

## 2023-12-12 ENCOUNTER — Encounter (INDEPENDENT_AMBULATORY_CARE_PROVIDER_SITE_OTHER): Payer: Self-pay | Admitting: Gastroenterology

## 2024-01-01 ENCOUNTER — Encounter: Payer: Self-pay | Admitting: Neurology

## 2024-03-19 ENCOUNTER — Ambulatory Visit (INDEPENDENT_AMBULATORY_CARE_PROVIDER_SITE_OTHER): Admitting: Gastroenterology

## 2024-04-18 ENCOUNTER — Ambulatory Visit: Admitting: Neurology

## 2024-09-09 ENCOUNTER — Ambulatory Visit: Admitting: Radiology
